# Patient Record
Sex: Female | Born: 1940 | Race: White | Hispanic: No | Smoking: Former smoker
Health system: Southern US, Community
[De-identification: ages and names within clinical notes are randomized; demographics above are authoritative.]

## PROBLEM LIST (undated history)

## (undated) DIAGNOSIS — I10 Essential (primary) hypertension: Secondary | ICD-10-CM

## (undated) DIAGNOSIS — G43909 Migraine, unspecified, not intractable, without status migrainosus: Secondary | ICD-10-CM

## (undated) DIAGNOSIS — E7800 Pure hypercholesterolemia, unspecified: Secondary | ICD-10-CM

## (undated) DIAGNOSIS — K219 Gastro-esophageal reflux disease without esophagitis: Secondary | ICD-10-CM

## (undated) DIAGNOSIS — R609 Edema, unspecified: Secondary | ICD-10-CM

## (undated) DIAGNOSIS — F32A Depression, unspecified: Secondary | ICD-10-CM

## (undated) DIAGNOSIS — I509 Heart failure, unspecified: Secondary | ICD-10-CM

## (undated) DIAGNOSIS — F329 Major depressive disorder, single episode, unspecified: Secondary | ICD-10-CM

## (undated) HISTORY — PX: CORONARY ANGIOPLASTY: SHX604

## (undated) HISTORY — PX: APPENDECTOMY: SHX54

## (undated) HISTORY — PX: LAMINECTOMY: SHX219

## (undated) HISTORY — PX: ABDOMINAL HYSTERECTOMY: SHX81

## (undated) HISTORY — DX: Edema, unspecified: R60.9

## (undated) HISTORY — PX: PARATHYROIDECTOMY: SHX19

---

## 2008-12-27 ENCOUNTER — Encounter: Admission: RE | Admit: 2008-12-27 | Discharge: 2008-12-27 | Payer: Self-pay | Admitting: Neurological Surgery

## 2009-12-29 ENCOUNTER — Ambulatory Visit (HOSPITAL_COMMUNITY): Admission: RE | Admit: 2009-12-29 | Discharge: 2009-12-29 | Payer: Self-pay | Admitting: Neurological Surgery

## 2010-04-20 ENCOUNTER — Emergency Department (HOSPITAL_BASED_OUTPATIENT_CLINIC_OR_DEPARTMENT_OTHER): Admission: EM | Admit: 2010-04-20 | Discharge: 2010-04-20 | Payer: Self-pay | Admitting: Emergency Medicine

## 2010-04-20 ENCOUNTER — Ambulatory Visit: Payer: Self-pay | Admitting: Radiology

## 2010-12-17 ENCOUNTER — Encounter: Payer: Self-pay | Admitting: Neurological Surgery

## 2011-02-12 LAB — BASIC METABOLIC PANEL
Calcium: 10.8 mg/dL — ABNORMAL HIGH (ref 8.4–10.5)
Chloride: 111 mEq/L (ref 96–112)
Creatinine, Ser: 1.1 mg/dL (ref 0.4–1.2)
GFR calc Af Amer: >60 mL/min (ref >60)

## 2011-02-15 LAB — DIFFERENTIAL
Basophils Absolute: 0 10*3/uL (ref 0.0–0.1)
Eosinophils Absolute: 0.1 10*3/uL (ref 0.0–0.7)
Eosinophils Relative: 1 % (ref 0–5)
Lymphocytes Relative: 30 % (ref 12–46)
Neutro Abs: 3.8 10*3/uL (ref 1.7–7.7)
Neutrophils Relative %: 60 % (ref 43–77)

## 2011-02-15 LAB — CBC
Hemoglobin: 12.9 g/dL (ref 12.0–15.0)
MCHC: 33.8 g/dL (ref 30.0–36.0)
MCV: 94.3 fL (ref 78.0–100.0)
Platelets: 199 10*3/uL (ref 150–400)
RBC: 4.05 MIL/uL (ref 3.87–5.11)
RDW: 14.2 % (ref 11.5–15.5)

## 2011-02-15 LAB — PROTIME-INR: INR: 0.88 (ref 0.00–1.49)

## 2011-02-15 LAB — BASIC METABOLIC PANEL
BUN: 18 mg/dL (ref 6–23)
Calcium: 10.9 mg/dL — ABNORMAL HIGH (ref 8.4–10.5)
GFR calc Af Amer: >60 mL/min (ref >60)
Potassium: 4.4 mEq/L (ref 3.5–5.1)
Sodium: 139 mEq/L (ref 135–145)

## 2012-10-23 ENCOUNTER — Encounter (HOSPITAL_BASED_OUTPATIENT_CLINIC_OR_DEPARTMENT_OTHER): Payer: Self-pay | Admitting: *Deleted

## 2012-10-23 ENCOUNTER — Emergency Department (HOSPITAL_BASED_OUTPATIENT_CLINIC_OR_DEPARTMENT_OTHER): Payer: Medicare Other

## 2012-10-23 ENCOUNTER — Emergency Department (HOSPITAL_BASED_OUTPATIENT_CLINIC_OR_DEPARTMENT_OTHER)
Admission: EM | Admit: 2012-10-23 | Discharge: 2012-10-23 | Disposition: A | Discharge status: Home / Self Care | Payer: Medicare Other | Attending: Emergency Medicine | Admitting: Emergency Medicine

## 2012-10-23 DIAGNOSIS — S61209A Unspecified open wound of unspecified finger without damage to nail, initial encounter: Secondary | ICD-10-CM | POA: Insufficient documentation

## 2012-10-23 DIAGNOSIS — Z87891 Personal history of nicotine dependence: Secondary | ICD-10-CM | POA: Insufficient documentation

## 2012-10-23 DIAGNOSIS — W01119A Fall on same level from slipping, tripping and stumbling with subsequent striking against unspecified sharp object, initial encounter: Secondary | ICD-10-CM | POA: Insufficient documentation

## 2012-10-23 DIAGNOSIS — S99929A Unspecified injury of unspecified foot, initial encounter: Secondary | ICD-10-CM | POA: Insufficient documentation

## 2012-10-23 DIAGNOSIS — Z23 Encounter for immunization: Secondary | ICD-10-CM | POA: Insufficient documentation

## 2012-10-23 DIAGNOSIS — Y929 Unspecified place or not applicable: Secondary | ICD-10-CM | POA: Insufficient documentation

## 2012-10-23 DIAGNOSIS — M25569 Pain in unspecified knee: Secondary | ICD-10-CM

## 2012-10-23 DIAGNOSIS — S8990XA Unspecified injury of unspecified lower leg, initial encounter: Secondary | ICD-10-CM | POA: Insufficient documentation

## 2012-10-23 DIAGNOSIS — S61219A Laceration without foreign body of unspecified finger without damage to nail, initial encounter: Secondary | ICD-10-CM

## 2012-10-23 DIAGNOSIS — Y939 Activity, unspecified: Secondary | ICD-10-CM | POA: Insufficient documentation

## 2012-10-23 DIAGNOSIS — Z79899 Other long term (current) drug therapy: Secondary | ICD-10-CM | POA: Insufficient documentation

## 2012-10-23 DIAGNOSIS — I10 Essential (primary) hypertension: Secondary | ICD-10-CM | POA: Insufficient documentation

## 2012-10-23 HISTORY — DX: Essential (primary) hypertension: I10

## 2012-10-23 MED ORDER — CEPHALEXIN 500 MG PO CAPS: 500.0000 mg | ORAL_CAPSULE | Freq: Four times a day (QID) | ORAL | Status: DC

## 2012-10-23 MED ORDER — LIDOCAINE HCL 2 % IJ SOLN
INTRAMUSCULAR | Status: AC
  Filled 2012-10-23: qty 20

## 2012-10-23 MED ORDER — HYDROCODONE-ACETAMINOPHEN 5-325 MG PO TABS
2.0000 | ORAL_TABLET | Freq: Once | ORAL | Status: AC
  Administered 2012-10-23: 2 via ORAL
  Filled 2012-10-23: qty 2

## 2012-10-23 MED ORDER — HYDROCODONE-ACETAMINOPHEN 5-325 MG PO TABS: 2.0000 | ORAL_TABLET | Freq: Four times a day (QID) | ORAL | Status: DC | PRN

## 2012-10-23 MED ORDER — TETANUS-DIPHTH-ACELL PERTUSSIS 5-2.5-18.5 LF-MCG/0.5 IM SUSP
0.5000 mL | Freq: Once | INTRAMUSCULAR | Status: AC
  Administered 2012-10-23: 0.5 mL via INTRAMUSCULAR
  Filled 2012-10-23: qty 0.5

## 2012-10-23 NOTE — ED Notes (Signed)
Patient transported to X-ray 

## 2012-10-23 NOTE — ED Notes (Addendum)
Pt reports fell over cord and ran into door. Pain to middle right finger. Pt states door shut on finger. Small laceration to inner finger, bleeding controlled, swelling/bruising to tip of finger as well. Pt also c/o left knee pain- ambulated to room, but small abrasion and swelling noted to knee.

## 2012-10-23 NOTE — ED Provider Notes (Signed)
History     CSN: [id number]  Arrival date & time 10/23/12  1652   First MD Initiated Contact with Patient 10/23/12 1719      Chief Complaint  Patient presents with  . Finger Injury    (Consider location/radiation/quality/duration/timing/severity/associated sxs/prior treatment) HPI Comments: This is a 71 year old female, who presents to the emergency department with chief complaint finger injury. She states that she tripped and fell on an electrical cord and cut her right middle finger on the door frame. She states that she is in moderate pain. She describes her pain as throbbing. She has not taken anything to alleviate her symptoms. Moving her finger makes pain worse.  The history is provided by the patient. No language interpreter was used.    Past Medical History  Diagnosis Date  . Hypertension     Past Surgical History  Procedure Date  . Cesarean section   . Appendectomy   . Parathyroidectomy   . Laminectomy     No family history on file.  History  Substance Use Topics  . Smoking status: Former Games developer  . Smokeless tobacco: Not on file  . Alcohol Use: Yes     Comment: occasional    OB History    Grav Para Term Preterm Abortions TAB SAB Ect Mult Living                  Review of Systems  All other systems reviewed and are negative.    Allergies  Morphine and related  Home Medications   Current Outpatient Rx  Name  Route  Sig  Dispense  Refill  . AMLODIPINE BESYLATE 5 MG PO TABS   Oral   Take 5 mg by mouth daily.         Marland Kitchen LISINOPRIL 5 MG PO TABS   Oral   Take 5 mg by mouth daily.         . CEPHALEXIN 500 MG PO CAPS   Oral   Take 1 capsule (500 mg total) by mouth 4 (four) times daily.   40 capsule   0   . HYDROCODONE-ACETAMINOPHEN 5-325 MG PO TABS   Oral   Take 2 tablets by mouth every 6 (six) hours as needed for pain.   10 tablet   0     BP 169/79  Pulse 89  Temp 97.9 F (36.6 C) (Oral)  Resp 20  Ht 5\' 4"  (1.626 m)  Wt 160  lb (72.576 kg)  BMI 27.46 kg/m2  SpO2 98%  Physical Exam  Nursing note and vitals reviewed. Constitutional: She is oriented to person, place, and time. She appears well-developed and well-nourished.  HENT:  Head: Normocephalic and atraumatic.  Eyes: Conjunctivae normal and EOM are normal. Pupils are equal, round, and reactive to light.  Neck: Normal range of motion. Neck supple.  Cardiovascular: Normal rate and regular rhythm.  Exam reveals no gallop and no friction rub.   No murmur heard. Pulmonary/Chest: Effort normal and breath sounds normal. No respiratory distress. She has no wheezes. She has no rales. She exhibits no tenderness.  Abdominal: Soft. Bowel sounds are normal. She exhibits no distension and no mass. There is no tenderness. There is no rebound and no guarding.  Musculoskeletal: Normal range of motion. She exhibits tenderness. She exhibits no edema.       Right middle finger tender to palpation over DIP. 2 cm laceration on the palmar aspect of the DIP  Neurological: She is alert and oriented to person, place,  and time.  Skin: Skin is warm and dry.  Psychiatric: She has a normal mood and affect. Her behavior is normal. Judgment and thought content normal.    ED Course  Procedures (including critical care time)  Labs Reviewed - No data to display Dg Knee 2 Views Left  10/23/2012  *RADIOLOGY REPORT*  Clinical Data: Fall  LEFT KNEE - 1-2 VIEW  Comparison: None.  Findings: Normal alignment and no fracture.  Joint spaces are normal.  No significant joint effusion.  IMPRESSION: No acute abnormality.   Original Report Authenticated By: Janeece Riggers, M.D.    Dg Finger Middle Right  10/23/2012  *RADIOLOGY REPORT*  Clinical Data: Fall  RIGHT MIDDLE FINGER 2+V  Comparison: None.  Findings: Negative for fracture.  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  IMPRESSION: Negative for fracture.   Original Report Authenticated By: Janeece Riggers,  M.D.   LACERATION REPAIR Performed by: Roxy Horseman Authorized by: Roxy Horseman Consent: Verbal consent obtained. Risks and benefits: risks, benefits and alternatives were discussed Consent given by: patient Patient identity confirmed: provided demographic data Prepped and Draped in normal sterile fashion Wound explored  Laceration Location: Right middle finger, palmar aspect, the DIP.  Laceration Length: 2 cm  No Foreign Bodies seen or palpated  Anesthesia: local infiltration  Local anesthetic: lidocaine 2 % without epinephrine  Anesthetic total: 5 ml  Irrigation method: syringe Amount of cleaning: standard  Skin closure: 4-0 monofilament   Number of sutures: 3   Technique: Simple interrupted   Patient tolerance: Patient tolerated the procedure well with no immediate complications.    1. Finger laceration   2. Knee pain       MDM  71 year old female with finger laceration. Repair the laceration. Patients pain was managed with Norco in the ED. Tetanus was updated. I will have the patient return in one week for suture removal. Return precautions have been given. Will give the patient Norco and Keflex. Patient is stable and ready for discharge. She understands and agrees with the plan.        Roxy Horseman, PA-C 10/23/12 1927

## 2012-10-24 NOTE — ED Provider Notes (Signed)
Medical screening examination/treatment/procedure(s) were performed by non-physician practitioner and as supervising physician I was immediately available for consultation/collaboration.   Celene Kras, MD 10/24/12 [phone]

## 2012-10-30 ENCOUNTER — Encounter (HOSPITAL_BASED_OUTPATIENT_CLINIC_OR_DEPARTMENT_OTHER): Payer: Self-pay | Admitting: *Deleted

## 2012-10-30 ENCOUNTER — Emergency Department (HOSPITAL_BASED_OUTPATIENT_CLINIC_OR_DEPARTMENT_OTHER)
Admission: EM | Admit: 2012-10-30 | Discharge: 2012-10-30 | Disposition: A | Discharge status: Home / Self Care | Payer: Medicare Other | Attending: Emergency Medicine | Admitting: Emergency Medicine

## 2012-10-30 DIAGNOSIS — Z79899 Other long term (current) drug therapy: Secondary | ICD-10-CM | POA: Insufficient documentation

## 2012-10-30 DIAGNOSIS — Z9889 Other specified postprocedural states: Secondary | ICD-10-CM | POA: Insufficient documentation

## 2012-10-30 DIAGNOSIS — Y929 Unspecified place or not applicable: Secondary | ICD-10-CM | POA: Insufficient documentation

## 2012-10-30 DIAGNOSIS — Z87891 Personal history of nicotine dependence: Secondary | ICD-10-CM | POA: Insufficient documentation

## 2012-10-30 DIAGNOSIS — IMO0002 Reserved for concepts with insufficient information to code with codable children: Secondary | ICD-10-CM | POA: Insufficient documentation

## 2012-10-30 DIAGNOSIS — S8392XA Sprain of unspecified site of left knee, initial encounter: Secondary | ICD-10-CM

## 2012-10-30 DIAGNOSIS — I10 Essential (primary) hypertension: Secondary | ICD-10-CM | POA: Insufficient documentation

## 2012-10-30 DIAGNOSIS — T148XXA Other injury of unspecified body region, initial encounter: Secondary | ICD-10-CM

## 2012-10-30 DIAGNOSIS — W1789XA Other fall from one level to another, initial encounter: Secondary | ICD-10-CM | POA: Insufficient documentation

## 2012-10-30 DIAGNOSIS — Z4802 Encounter for removal of sutures: Secondary | ICD-10-CM | POA: Insufficient documentation

## 2012-10-30 DIAGNOSIS — S61219A Laceration without foreign body of unspecified finger without damage to nail, initial encounter: Secondary | ICD-10-CM

## 2012-10-30 DIAGNOSIS — Y939 Activity, unspecified: Secondary | ICD-10-CM | POA: Insufficient documentation

## 2012-10-30 NOTE — ED Notes (Signed)
Suture removal right middle finger.

## 2012-10-30 NOTE — ED Notes (Signed)
Pt. Was here for suture removal and reports to EDP that she has ankle pain and L knee pain.  EDP to evaluate the Pt. Complaints.

## 2012-10-30 NOTE — ED Notes (Signed)
Pt. Has no drainage from her wound and does have mild edema noted in the R middle finger.

## 2012-10-30 NOTE — ED Provider Notes (Signed)
History     CSN: [id number]  Arrival date & time 10/30/12  1910   First MD Initiated Contact with Patient 10/30/12 1916      Chief Complaint  Patient presents with  . Generalized Body Aches    due to fall on Thanksgiving Day.  Pt. was seen here on Thanksgiving Day at 1700.     Patient is a 71 y.o. female presenting with suture removal. The history is provided by the patient.  Suture / Staple Removal  The sutures were placed 7 to 10 days ago. There has been no treatment since the wound repair. There has been no drainage from the wound. There is no redness present. There is no swelling present. The pain has new pain. She has no difficulty moving the affected extremity or digit.  pt had sutures placed to laceration of right middle finger.  She is here for removal  She also reports swelling/bruising to left LE.  She reports long h/o bilateral LE edema but seems worse and now has bruising to left LE.  She did injury her left knee in the fall that also caused the laceration    Past Medical History  Diagnosis Date  . Hypertension     Past Surgical History  Procedure Date  . Cesarean section   . Appendectomy   . Parathyroidectomy   . Laminectomy     No family history on file.  History  Substance Use Topics  . Smoking status: Former Games developer  . Smokeless tobacco: Not on file  . Alcohol Use: Yes     Comment: occasional    OB History    Grav Para Term Preterm Abortions TAB SAB Ect Mult Living                  Review of Systems  Constitutional: Negative for fever.  Skin: Positive for wound.    Allergies  Morphine and related  Home Medications   Current Outpatient Rx  Name  Route  Sig  Dispense  Refill  . AMLODIPINE BESYLATE 5 MG PO TABS   Oral   Take 5 mg by mouth daily.         . CEPHALEXIN 500 MG PO CAPS   Oral   Take 1 capsule (500 mg total) by mouth 4 (four) times daily.   40 capsule   0   . HYDROCODONE-ACETAMINOPHEN 5-325 MG PO TABS   Oral   Take  2 tablets by mouth every 6 (six) hours as needed for pain.   10 tablet   0   . LISINOPRIL 5 MG PO TABS   Oral   Take 5 mg by mouth daily.           BP 119/90  Pulse 88  Temp 98 F (36.7 C) (Oral)  Resp 20  SpO2 98%  Physical Exam CONSTITUTIONAL: Well developed/well nourished HEAD AND FACE: Normocephalic/atraumatic EYES: EOMI/PERRL ENMT: Mucous membranes moist NECK: supple no meningeal signs CV: S1/S2 noted, no murmurs/rubs/gallops noted LUNGS: Lungs are clear to auscultation bilaterally, no apparent distress ABDOMEN: soft, nontender, no rebound or guarding GU:no cva tenderness NEURO: Pt is awake/alert, moves all extremitiesx4 EXTREMITIES: pulses normal, full ROM. Well healed laceration to right middle finger.  No drainage.  Minimal erythema.  She can range the finger.  She has signs of arthritis in her fingers Equal/symmetric pitting edema to bilateral LE.  She has bruising that originates in left knee and has some bruising noted throughout her left LE.  There is  mild tenderness to left patella.   There is no calf tenderness and it is soft and there is no tenderness to left foot/ankle SKIN: warm, color normal PSYCH: no abnormalities of mood noted    ED Course  SUTURE REMOVAL Performed by: Joya Gaskins Authorized by: Joya Gaskins Consent: Verbal consent obtained. Body area: upper extremity Location details: right long finger Wound Appearance: clean Sutures Removed: 3 Post-removal: dressing applied and antibiotic ointment applied Facility: sutures placed in this facility Patient tolerance: Patient tolerated the procedure well with no immediate complications.     1. Finger laceration   2. Hematoma   3. Left knee sprain       MDM  Nursing notes including past medical history and social history reviewed and considered in documentation Previous records reviewed and considered - previous xrays reviewed  For wound, bacitracin and keep bandage in  place the next several days For her left LE, she has chronic edema that is unchanged.  She does have bruising to left LE but feel this is related to recent fall.  No calf tenderness.  Doubt PE.           Joya Gaskins, MD 10/30/12 2046

## 2013-04-13 ENCOUNTER — Other Ambulatory Visit: Payer: Self-pay | Admitting: *Deleted

## 2013-04-13 ENCOUNTER — Non-Acute Institutional Stay (SKILLED_NURSING_FACILITY): Payer: Medicare Other | Admitting: Adult Health

## 2013-04-13 DIAGNOSIS — M25579 Pain in unspecified ankle and joints of unspecified foot: Secondary | ICD-10-CM

## 2013-04-13 DIAGNOSIS — I10 Essential (primary) hypertension: Secondary | ICD-10-CM

## 2013-04-13 DIAGNOSIS — F411 Generalized anxiety disorder: Secondary | ICD-10-CM

## 2013-04-13 DIAGNOSIS — IMO0001 Reserved for inherently not codable concepts without codable children: Secondary | ICD-10-CM

## 2013-04-13 DIAGNOSIS — S82842D Displaced bimalleolar fracture of left lower leg, subsequent encounter for closed fracture with routine healing: Secondary | ICD-10-CM

## 2013-04-13 DIAGNOSIS — K5900 Constipation, unspecified: Secondary | ICD-10-CM

## 2013-04-13 DIAGNOSIS — G4700 Insomnia, unspecified: Secondary | ICD-10-CM

## 2013-04-13 DIAGNOSIS — M25572 Pain in left ankle and joints of left foot: Secondary | ICD-10-CM

## 2013-04-13 DIAGNOSIS — K219 Gastro-esophageal reflux disease without esophagitis: Secondary | ICD-10-CM

## 2013-04-13 DIAGNOSIS — F419 Anxiety disorder, unspecified: Secondary | ICD-10-CM

## 2013-04-13 MED ORDER — OXYCODONE-ACETAMINOPHEN 5-325 MG PO TABS: ORAL_TABLET | ORAL | Status: DC

## 2013-04-14 ENCOUNTER — Non-Acute Institutional Stay (SKILLED_NURSING_FACILITY): Payer: Medicare Other | Admitting: Internal Medicine

## 2013-04-14 DIAGNOSIS — S82842S Displaced bimalleolar fracture of left lower leg, sequela: Secondary | ICD-10-CM

## 2013-04-14 DIAGNOSIS — I10 Essential (primary) hypertension: Secondary | ICD-10-CM

## 2013-04-14 DIAGNOSIS — S8290XS Unspecified fracture of unspecified lower leg, sequela: Secondary | ICD-10-CM

## 2013-04-14 DIAGNOSIS — G4700 Insomnia, unspecified: Secondary | ICD-10-CM

## 2013-04-14 DIAGNOSIS — M79609 Pain in unspecified limb: Secondary | ICD-10-CM

## 2013-04-15 ENCOUNTER — Non-Acute Institutional Stay (SKILLED_NURSING_FACILITY): Payer: Medicare Other | Admitting: Adult Health

## 2013-04-15 DIAGNOSIS — B372 Candidiasis of skin and nail: Secondary | ICD-10-CM

## 2013-04-21 ENCOUNTER — Other Ambulatory Visit: Payer: Self-pay | Admitting: Geriatric Medicine

## 2013-04-21 MED ORDER — OXYCODONE-ACETAMINOPHEN 5-325 MG PO TABS: ORAL_TABLET | ORAL | Status: DC

## 2013-05-02 DIAGNOSIS — M810 Age-related osteoporosis without current pathological fracture: Secondary | ICD-10-CM | POA: Insufficient documentation

## 2013-05-02 DIAGNOSIS — F329 Major depressive disorder, single episode, unspecified: Secondary | ICD-10-CM | POA: Insufficient documentation

## 2013-05-02 DIAGNOSIS — E213 Hyperparathyroidism, unspecified: Secondary | ICD-10-CM | POA: Insufficient documentation

## 2013-05-02 DIAGNOSIS — H9190 Unspecified hearing loss, unspecified ear: Secondary | ICD-10-CM | POA: Insufficient documentation

## 2013-05-02 DIAGNOSIS — R5383 Other fatigue: Secondary | ICD-10-CM | POA: Insufficient documentation

## 2013-05-02 DIAGNOSIS — F331 Major depressive disorder, recurrent, moderate: Secondary | ICD-10-CM | POA: Insufficient documentation

## 2013-05-02 DIAGNOSIS — E785 Hyperlipidemia, unspecified: Secondary | ICD-10-CM | POA: Insufficient documentation

## 2013-05-07 DIAGNOSIS — G4700 Insomnia, unspecified: Secondary | ICD-10-CM | POA: Insufficient documentation

## 2013-05-07 DIAGNOSIS — M79609 Pain in unspecified limb: Secondary | ICD-10-CM | POA: Insufficient documentation

## 2013-05-07 DIAGNOSIS — I10 Essential (primary) hypertension: Secondary | ICD-10-CM | POA: Insufficient documentation

## 2013-05-07 DIAGNOSIS — S82843A Displaced bimalleolar fracture of unspecified lower leg, initial encounter for closed fracture: Secondary | ICD-10-CM | POA: Insufficient documentation

## 2013-05-07 NOTE — Progress Notes (Signed)
Patient ID: Stephanie Mckenzie, female   DOB: 1941/02/26, 72 y.o.   MRN: [id number]        HISTORY & PHYSICAL  DATE: 04/14/2013   FACILITY: [facility]  LEVEL OF CARE: SNF (31)  ALLERGIES:  Allergies  Allergen Reactions  . Morphine And Related Itching    CHIEF COMPLAINT:  Manage left ankle fracture, hypertension, and insomnia.    HISTORY OF PRESENT ILLNESS:  The patient is a 72 year-old, Caucasian female.    ANKLE FRACTURE: The patient had a mechanical fall and sustained an ankle fracture. Patient underwent surgical repair and tolerated the procedure well. Patient is admitted to this facility for short-term rehabilitation. The patient complains of uncontrolled left lower extremity pain as well as pain in multiple areas. No complications reported from the current medication(s) being used.   HTN: Pt 's HTN remains stable.  Denies CP, sob, DOE, pedal edema, headaches, dizziness or visual disturbances.  No complications from the medications currently being used.  Last BP : 163/85, 142/85.  INSOMNIA: The insomnia remains stable.  No complications noted from the medications presently being used. Patient denies ongoing insomnia, pain, hallucinations, delusions.   PAST MEDICAL HISTORY :  Past Medical History  Diagnosis Date  . Hypertension    Depression.   Insomnia.    Anxiety.    GERD.  Constipation.   PAST SURGICAL HISTORY: Past Surgical History  Procedure Laterality Date  . Cesarean section    . Appendectomy    . Parathyroidectomy    . Laminectomy      SOCIAL HISTORY:  reports that she has quit smoking. She does not have any smokeless tobacco history on file. She reports that  drinks alcohol. She reports that she does not use illicit drugs. HOUSING:  The patient lives alone.  FAMILY HISTORY: None  CURRENT MEDICATIONS: Reviewed per MAR  REVIEW OF SYSTEMS:  See HPI otherwise 14 point ROS is negative.  PHYSICAL EXAMINATION  VS:  T 96.6      P 85        RR 22      BP 163/85      POX 97%        WT (Lb)  GENERAL: no acute distress, normal body habitus SKIN: warm & dry, no suspicious lesions or rashes, no excessive dryness, left lower extremity is in a cast EYES: conjunctivae normal, sclerae normal, normal eye lids MOUTH/THROAT: lips without lesions,no lesions in the mouth,tongue is without lesions,uvula elevates in midline NECK: supple, trachea midline, no neck masses, no thyroid tenderness, no thyromegaly LYMPHATICS: no LAN in the neck, no supraclavicular LAN RESPIRATORY: breathing is even & unlabored, BS CTAB CARDIAC: RRR, no murmur,no extra heart sounds, no edema GI:  ABDOMEN: abdomen soft, normal BS, no masses, no tenderness  LIVER/SPLEEN: no hepatomegaly, no splenomegaly MUSCULOSKELETAL: HEAD: normal to inspection & palpation BACK: no kyphosis, scoliosis or spinal processes tenderness EXTREMITIES: LEFT UPPER EXTREMITY: full range of motion, normal strength & tone RIGHT UPPER EXTREMITY:  full range of motion, normal strength & tone LEFT LOWER EXTREMITY: not tested due to cast RIGHT LOWER EXTREMITY:  full range of motion, normal strength & tone PSYCHIATRIC: the patient is alert & oriented to person, affect & behavior appropriate  LABS/RADIOLOGY: Hemoglobin 10, MCV 89, otherwise CBC normal.    Glucose 156, otherwise BMP normal.    CK:  86, 89, 179.  CK-MB:  2.2, 2.1, 4.6.  Troponin-I:  0.01, less than 0.01 x3.    Urinalysis negative.  ASSESSMENT/PLAN:  Left trimalleolar fracture.  Status post ORIF.  Continue rehabilitation.   Hypertension.  Blood pressure elevated.  Likely secondary to pain.  We will monitor for now.   Insomnia.  Continue trazodone.   Left lower extremity pain.  Uncontrolled problem.  Pain medications adjusted.    GERD.  Well controlled.    Check CBC and BMP.   I have reviewed patient's medical records received at admission/from hospitalization.  CPT CODE: 16109

## 2013-05-08 ENCOUNTER — Other Ambulatory Visit: Payer: Self-pay | Admitting: *Deleted

## 2013-05-08 MED ORDER — ALPRAZOLAM 0.5 MG PO TABS: ORAL_TABLET | ORAL | Status: DC

## 2013-05-15 ENCOUNTER — Non-Acute Institutional Stay (SKILLED_NURSING_FACILITY): Payer: Medicare Other | Admitting: Internal Medicine

## 2013-05-15 DIAGNOSIS — S8290XS Unspecified fracture of unspecified lower leg, sequela: Secondary | ICD-10-CM

## 2013-05-15 DIAGNOSIS — F329 Major depressive disorder, single episode, unspecified: Secondary | ICD-10-CM

## 2013-05-15 DIAGNOSIS — G4700 Insomnia, unspecified: Secondary | ICD-10-CM

## 2013-05-15 DIAGNOSIS — I10 Essential (primary) hypertension: Secondary | ICD-10-CM

## 2013-05-15 DIAGNOSIS — S82842S Displaced bimalleolar fracture of left lower leg, sequela: Secondary | ICD-10-CM

## 2013-05-18 ENCOUNTER — Other Ambulatory Visit: Payer: Self-pay | Admitting: *Deleted

## 2013-05-18 MED ORDER — OXYCODONE HCL 5 MG PO TABS: ORAL_TABLET | ORAL | Status: DC

## 2013-05-22 DIAGNOSIS — F418 Other specified anxiety disorders: Secondary | ICD-10-CM | POA: Insufficient documentation

## 2013-05-22 NOTE — Progress Notes (Signed)
PROGRESS NOTE  DATE: 05/15/2013  FACILITY: Nursing Home Location: [facility]  LEVEL OF CARE: SNF (31)  Routine Visit  CHIEF COMPLAINT:  Manage hypertension, insomnia and depression  HISTORY OF PRESENT ILLNESS:  REASSESSMENT OF ONGOING PROBLEM(S):  HTN: Pt 's HTN remains stable.  Denies CP, sob, DOE, pedal edema, headaches, dizziness or visual disturbances.  No complications from the medications currently being used.  Last BP : 124/ 61.  DEPRESSION: The depression remains stable. Patient denies ongoing feelings of sadness, insomnia, anedhonia or lack of appetite. No complications reported from the medications currently being used. Staff do not report behavioral problems.  INSOMNIA: The insomnia remains stable.  No complications noted from the medications presently being used. Patient denies ongoing insomnia, pain, hallucinations, delusions.  PAST MEDICAL HISTORY : Reviewed.  No changes.  CURRENT MEDICATIONS: Reviewed per [facility]  REVIEW OF SYSTEMS:  GENERAL: no change in appetite, no fatigue, no weight changes, no fever, chills or weakness RESPIRATORY: no cough, SOB, DOE, wheezing, hemoptysis CARDIAC: no chest pain, edema or palpitations GI: no abdominal pain, diarrhea, constipation, heart burn, nausea or vomiting  PHYSICAL EXAMINATION  VS:  T 98.3       P 73      RR 18      BP 124/61     POX %     WT (Lb)  GENERAL: no acute distress, normal body habitus EYES: conjunctivae normal, sclerae normal, normal eye lids NECK: supple, trachea midline, no neck masses, no thyroid tenderness, no thyromegaly LYMPHATICS: no LAN in the neck, no supraclavicular LAN RESPIRATORY: breathing is even & unlabored, BS CTAB CARDIAC: RRR, no murmur,no extra heart sounds, no edema GI: abdomen soft, normal BS, no masses, no tenderness, no hepatomegaly, no splenomegaly PSYCHIATRIC: the patient is alert & oriented to person, affect & behavior appropriate  LABS/RADIOLOGY:  5/14  hemoglobin 11.4, MCV 85 otherwise CBC normal, BMP normal  ASSESSMENT/PLAN:  HTN-well controlled. Insomina-denies ongoing sx. Depression-stable. Anxiety-stable. GERD-well controlled. Constipation-well controlled. Left bimalleolar fx-s/p ORIF.  Cont. Rehab. Check liver profile.  CPT CODE: 16109

## 2013-06-09 ENCOUNTER — Encounter: Payer: Self-pay | Admitting: Adult Health

## 2013-06-09 ENCOUNTER — Non-Acute Institutional Stay (SKILLED_NURSING_FACILITY): Payer: Medicare Other | Admitting: Adult Health

## 2013-06-09 DIAGNOSIS — F419 Anxiety disorder, unspecified: Secondary | ICD-10-CM

## 2013-06-09 DIAGNOSIS — K5900 Constipation, unspecified: Secondary | ICD-10-CM | POA: Insufficient documentation

## 2013-06-09 DIAGNOSIS — I10 Essential (primary) hypertension: Secondary | ICD-10-CM

## 2013-06-09 DIAGNOSIS — K219 Gastro-esophageal reflux disease without esophagitis: Secondary | ICD-10-CM | POA: Insufficient documentation

## 2013-06-09 DIAGNOSIS — G4700 Insomnia, unspecified: Secondary | ICD-10-CM | POA: Insufficient documentation

## 2013-06-09 DIAGNOSIS — IMO0001 Reserved for inherently not codable concepts without codable children: Secondary | ICD-10-CM

## 2013-06-09 DIAGNOSIS — M25572 Pain in left ankle and joints of left foot: Secondary | ICD-10-CM

## 2013-06-09 DIAGNOSIS — M25579 Pain in unspecified ankle and joints of unspecified foot: Secondary | ICD-10-CM

## 2013-06-09 DIAGNOSIS — F329 Major depressive disorder, single episode, unspecified: Secondary | ICD-10-CM

## 2013-06-09 DIAGNOSIS — F411 Generalized anxiety disorder: Secondary | ICD-10-CM

## 2013-06-09 DIAGNOSIS — S82842D Displaced bimalleolar fracture of left lower leg, subsequent encounter for closed fracture with routine healing: Secondary | ICD-10-CM

## 2013-06-09 NOTE — Progress Notes (Signed)
  Subjective:    Patient ID: Stephanie Mckenzie, female    DOB: 03-03-1941, 72 y.o.   MRN: [id number]  HPI  This is a 72 year old female who is for discharge home with home health PT, OT and nursing.  DME: Rolling walker due to unsteady gait. [facility] status post ORIF of left trimalleolar ankle fracture. Patient has completed SNF rehabilitation and therapy has cleared the patient for discharge.    Review of Systems  Constitutional: Negative.   HENT: Negative.   Eyes: Negative.   Respiratory: Negative for shortness of breath.   Cardiovascular: Negative for leg swelling.  Gastrointestinal: Negative for abdominal distention.  Endocrine: Negative.   Genitourinary: Negative.   Neurological: Negative.   Hematological: Negative for adenopathy. Does not bruise/bleed easily.       Objective:   Physical Exam  Nursing note and vitals reviewed. Constitutional: She is oriented to person, place, and time. She appears well-developed and well-nourished.  HENT:  Head: Normocephalic and atraumatic.  Right Ear: External ear normal.  Left Ear: External ear normal.  Eyes: Conjunctivae and EOM are normal. Pupils are equal, round, and reactive to light.  Neck: Normal range of motion. Neck supple. No thyromegaly present.  Cardiovascular: Normal rate, regular rhythm and normal heart sounds.   Pulmonary/Chest: Effort normal and breath sounds normal.  Abdominal: Soft. Bowel sounds are normal.  Musculoskeletal: Normal range of motion. She exhibits no edema and no tenderness.  Neurological: She is alert and oriented to person, place, and time.  Skin: Skin is warm and dry.  Psychiatric: She has a normal mood and affect. Her behavior is normal. Judgment and thought content normal.      LABS/PROCEDURE: 04/13/13 WBC 6.4 hemoglobin 11.4 hematocrit 34.0 sodium 139 potassium 4.6 glucose 83 BUN 17 creatinine 0.88 cultures  9.4  Left ankle x-ray shows healing fractures involving the lateral malleolus and medial malleolus with orthopedic hardware in place. 04/10/13 sodium 140 potassium 3.8 BUN 18 creatinine 0.80 glucose 84 calcium 8.8  04/09/13 WBC 5.9 hemoglobin 10.0 hematocrit 31.1    Medications reviewed.    Assessment & Plan:   Anxiety - stable  Pain in joint, ankle and foot - well controlled  GERD (gastroesophageal reflux disease) - stable  Insomnia - stable  Constipation - stable  Depressive disorder, not elsewhere classified - stable  Bimalleolar fracture S/P ORIF -  For  home health PT, Nursing and OT  Essential hypertension, benign - well controlled  Insomnia, unspecified -  stable      Total discharge time: > 30 minutes Discharge time involved coordination of the discharge process with social worker or, nursing staff and therapy department. Medical justification for home health services/DME verified.     CPT CODE: 13086

## 2013-06-09 NOTE — Progress Notes (Signed)
  Subjective:    Patient ID: Stephanie Mckenzie, female    DOB: 30-Sep-1941, 72 y.o.   MRN: [id number]  HPI This is a 72 year old female who was being [facility]10[facility] with discharge diagnosis of status post ORIF of left trimalleolar ankle fracture. She has been admitted for a short-term rehabilitation. I was called in to assess patient. Patient was reaching for able when accidentally few of the books fell on her left ankle with a cast. Cast is intact.  Review of Systems  Constitutional: Negative.   HENT: Negative.   Eyes: Negative.   Respiratory: Negative for shortness of breath.   Cardiovascular: Negative for leg swelling.  Gastrointestinal: Negative for abdominal distention.  Endocrine: Negative.   Genitourinary: Negative.   Neurological: Negative.   Hematological: Negative for adenopathy. Does not bruise/bleed easily.  Psychiatric/Behavioral: The patient is nervous/anxious.        Objective:   Physical Exam  Nursing note and vitals reviewed. Constitutional: She is oriented to person, place, and time. She appears well-developed and well-nourished.  HENT:  Head: Normocephalic and atraumatic.  Right Ear: External ear normal.  Left Ear: External ear normal.  Eyes: Conjunctivae and EOM are normal. Pupils are equal, round, and reactive to light.  Neck: Normal range of motion. Neck supple. No thyromegaly present.  Cardiovascular: Normal rate, regular rhythm and normal heart sounds.   Pulmonary/Chest: Effort normal and breath sounds normal.  Abdominal: Soft. Bowel sounds are normal.  Musculoskeletal: She exhibits tenderness. She exhibits no edema.  Neurological: She is alert and oriented to person, place, and time.  Skin: Skin is warm and dry.  Psychiatric: Her behavior is normal. Judgment and thought content normal.  anxious      LABS/PROCEDURE: 04/10/13 sodium 140 potassium 3.8 BUN 18 creatinine 0.80 glucose 84 calcium 8.8  04/09/13 WBC  5.9 hemoglobin 10.0 hematocrit 31.1    Medications reviewed.    Assessment & Plan:   Anxiety - and Xanax 0.5 mg 1 tab by mouth now.  Pain in joint, ankle and foot - increased Ercocet 5/325 mg 1 tab by mouth q. 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

## 2013-06-09 NOTE — Progress Notes (Signed)
  Subjective:    Patient ID: Stephanie Mckenzie, female    DOB: [birth year], 72 y.o.   MRN: [id number]  HPI  This is a 72 year old female who is being seen due to rashes underneath bilateral breasts. Noted  erythematosus small rashes underneath bilateral breasts.   Review of Systems  Constitutional: Negative.   HENT: Negative.   Eyes: Negative.   Respiratory: Negative for shortness of breath.   Cardiovascular: Negative for leg swelling.  Gastrointestinal: Negative for abdominal distention.  Endocrine: Negative.   Genitourinary: Negative.   Skin: Positive for rash.  Neurological: Negative.   Hematological: Negative for adenopathy. Does not bruise/bleed easily.  Psychiatric/Behavioral: The patient is not nervous/anxious.        Objective:   Physical Exam  Nursing note and vitals reviewed. Constitutional: She is oriented to person, place, and time. She appears well-developed and well-nourished.  HENT:  Head: Normocephalic and atraumatic.  Right Ear: External ear normal.  Left Ear: External ear normal.  Eyes: Conjunctivae and EOM are normal. Pupils are equal, round, and reactive to light.  Neck: Normal range of motion. Neck supple. No thyromegaly present.  Cardiovascular: Normal rate, regular rhythm and normal heart sounds.   Pulmonary/Chest: Effort normal and breath sounds normal.  Abdominal: Soft. Bowel sounds are normal.  Musculoskeletal: She exhibits tenderness. She exhibits no edema.  Neurological: She is alert and oriented to person, place, and time.  Skin: Skin is warm and dry. Rash noted.  Erythematous rashes under bilateral breasts  Psychiatric: She has a normal mood and affect. Her behavior is normal. Judgment and thought content normal.      LABS/PROCEDURE: 04/13/13 WBC 6.4 hemoglobin 11.4 hematocrit 34.0 sodium 139 potassium 4.6 glucose 83 BUN 17 creatinine 0.88 cultures 9.4  Left ankle x-ray shows healing fractures involving the lateral malleolus and medial malleolus  with orthopedic hardware in place. 04/10/13 sodium 140 potassium 3.8 BUN 18 creatinine 0.80 glucose 84 calcium 8.8  04/09/13 WBC 5.9 hemoglobin 10.0 hematocrit 31.1    Medications reviewed.     Assessment & Plan:   Candidal skin infection - apply nystatin 100,000 units per gram powder to rashes under bilateral breasts twice a day x2 weeks

## 2014-08-02 DIAGNOSIS — D4959 Neoplasm of unspecified behavior of other genitourinary organ: Secondary | ICD-10-CM | POA: Insufficient documentation

## 2014-08-05 DIAGNOSIS — K5190 Ulcerative colitis, unspecified, without complications: Secondary | ICD-10-CM | POA: Insufficient documentation

## 2014-08-11 DIAGNOSIS — K529 Noninfective gastroenteritis and colitis, unspecified: Secondary | ICD-10-CM | POA: Insufficient documentation

## 2014-08-26 DIAGNOSIS — N83209 Unspecified ovarian cyst, unspecified side: Secondary | ICD-10-CM | POA: Insufficient documentation

## 2014-09-24 DIAGNOSIS — R1900 Intra-abdominal and pelvic swelling, mass and lump, unspecified site: Secondary | ICD-10-CM | POA: Insufficient documentation

## 2014-09-24 DIAGNOSIS — I2510 Atherosclerotic heart disease of native coronary artery without angina pectoris: Secondary | ICD-10-CM | POA: Insufficient documentation

## 2014-09-30 DIAGNOSIS — Z8719 Personal history of other diseases of the digestive system: Secondary | ICD-10-CM | POA: Insufficient documentation

## 2014-09-30 DIAGNOSIS — I10 Essential (primary) hypertension: Secondary | ICD-10-CM | POA: Insufficient documentation

## 2015-01-18 DIAGNOSIS — M7060 Trochanteric bursitis, unspecified hip: Secondary | ICD-10-CM | POA: Insufficient documentation

## 2015-01-19 DIAGNOSIS — N189 Chronic kidney disease, unspecified: Secondary | ICD-10-CM | POA: Insufficient documentation

## 2015-05-04 ENCOUNTER — Emergency Department (HOSPITAL_BASED_OUTPATIENT_CLINIC_OR_DEPARTMENT_OTHER): Payer: PPO

## 2015-05-04 ENCOUNTER — Emergency Department (HOSPITAL_BASED_OUTPATIENT_CLINIC_OR_DEPARTMENT_OTHER)
Admission: EM | Admit: 2015-05-04 | Discharge: 2015-05-04 | Disposition: A | Discharge status: Home / Self Care | Payer: PPO | Attending: Emergency Medicine | Admitting: Emergency Medicine

## 2015-05-04 ENCOUNTER — Encounter (HOSPITAL_BASED_OUTPATIENT_CLINIC_OR_DEPARTMENT_OTHER): Payer: Self-pay | Admitting: *Deleted

## 2015-05-04 DIAGNOSIS — I10 Essential (primary) hypertension: Secondary | ICD-10-CM | POA: Diagnosis not present

## 2015-05-04 DIAGNOSIS — Z7982 Long term (current) use of aspirin: Secondary | ICD-10-CM | POA: Insufficient documentation

## 2015-05-04 DIAGNOSIS — Z87891 Personal history of nicotine dependence: Secondary | ICD-10-CM | POA: Diagnosis not present

## 2015-05-04 DIAGNOSIS — G43909 Migraine, unspecified, not intractable, without status migrainosus: Secondary | ICD-10-CM | POA: Diagnosis not present

## 2015-05-04 DIAGNOSIS — R51 Headache: Secondary | ICD-10-CM | POA: Diagnosis present

## 2015-05-04 DIAGNOSIS — R519 Headache, unspecified: Secondary | ICD-10-CM

## 2015-05-04 DIAGNOSIS — Z79899 Other long term (current) drug therapy: Secondary | ICD-10-CM | POA: Diagnosis not present

## 2015-05-04 HISTORY — DX: Migraine, unspecified, not intractable, without status migrainosus: G43.909

## 2015-05-04 LAB — CBC WITH DIFFERENTIAL/PLATELET
Basophils Absolute: 0 10*3/uL (ref 0.0–0.1)
Basophils Relative: 0 % (ref 0–1)
EOS ABS: 0.1 10*3/uL (ref 0.0–0.7)
EOS PCT: 1 % (ref 0–5)
HEMATOCRIT: 37.7 % (ref 36.0–46.0)
Hemoglobin: 12 g/dL (ref 12.0–15.0)
LYMPHS ABS: 1.8 10*3/uL (ref 0.7–4.0)
Lymphocytes Relative: 27 % (ref 12–46)
MCH: 30.2 pg (ref 26.0–34.0)
MCHC: 31.8 g/dL (ref 30.0–36.0)
MCV: 95 fL (ref 78.0–100.0)
MONO ABS: 0.6 10*3/uL (ref 0.1–1.0)
Monocytes Relative: 9 % (ref 3–12)
Neutro Abs: 4.2 10*3/uL (ref 1.7–7.7)
Neutrophils Relative %: 63 % (ref 43–77)
Platelets: 203 10*3/uL (ref 150–400)
RBC: 3.97 MIL/uL (ref 3.87–5.11)
RDW: 15.1 % (ref 11.5–15.5)
WBC: 6.7 10*3/uL (ref 4.0–10.5)

## 2015-05-04 LAB — BASIC METABOLIC PANEL
Anion gap: 5 (ref 5–15)
BUN: 24 mg/dL — ABNORMAL HIGH (ref 6–20)
CO2: 23 mmol/L (ref 22–32)
Calcium: 9.2 mg/dL (ref 8.9–10.3)
Chloride: 110 mmol/L (ref 101–111)
Creatinine, Ser: 1.03 mg/dL — ABNORMAL HIGH (ref 0.44–1.00)
GFR, EST NON AFRICAN AMERICAN: 53 mL/min — AB (ref >60)
Glucose, Bld: 114 mg/dL — ABNORMAL HIGH (ref 65–99)
Potassium: 3.6 mmol/L (ref 3.5–5.1)
Sodium: 138 mmol/L (ref 135–145)

## 2015-05-04 MED ORDER — KETOROLAC TROMETHAMINE 15 MG/ML IJ SOLN
15.0000 mg | Freq: Once | INTRAMUSCULAR | Status: AC
  Administered 2015-05-04: 15 mg via INTRAVENOUS
  Filled 2015-05-04: qty 1

## 2015-05-04 MED ORDER — SODIUM CHLORIDE 0.9 % IV BOLUS (SEPSIS)
500.0000 mL | Freq: Once | INTRAVENOUS | Status: AC
  Administered 2015-05-04: 500 mL via INTRAVENOUS

## 2015-05-04 MED ORDER — DIPHENHYDRAMINE HCL 50 MG/ML IJ SOLN
25.0000 mg | Freq: Once | INTRAMUSCULAR | Status: AC
  Administered 2015-05-04: 25 mg via INTRAVENOUS
  Filled 2015-05-04: qty 1

## 2015-05-04 MED ORDER — HYDROMORPHONE HCL 1 MG/ML IJ SOLN
1.0000 mg | Freq: Once | INTRAMUSCULAR | Status: AC
  Administered 2015-05-04: 1 mg via INTRAVENOUS
  Filled 2015-05-04 (×2): qty 1

## 2015-05-04 MED ORDER — METOCLOPRAMIDE HCL 5 MG/ML IJ SOLN
10.0000 mg | Freq: Once | INTRAMUSCULAR | Status: AC
  Administered 2015-05-04: 10 mg via INTRAVENOUS
  Filled 2015-05-04: qty 2

## 2015-05-04 NOTE — ED Provider Notes (Signed)
Pt seen and evaluated.  History reviewed. Examined.  D/W PA.  Pt reports HA over several months, usually relieved with Fioricet and codeine. Today's headache was not. She does not have neurological symptoms. No numbness weakness tingling. No dizziness lightheadedness. No difficulty with vision balance or coordination.  Skin shows possible area of ischemia adjacent the right Centrum Semiovale. Sign of hemorrhage or mass effect. I discussed this with her at length. I recommended she follow up with her primary care physician. Do not think her symptoms today are related to acute ischemia. Her main complaint is headache and no neurological symptoms. Maximum blood pressure systolic here was 916. Recommend she follow-up with her primary care physician regarding blood pressure control. Continue her outpatient treatment regimen for her migraine headaches.  Tanna Furry, MD 05/04/15 Bosie Helper

## 2015-05-04 NOTE — ED Notes (Signed)
Pt d/c home with family to drive

## 2015-05-04 NOTE — ED Notes (Signed)
Patient transported to CT 

## 2015-05-04 NOTE — ED Provider Notes (Signed)
CSN: [id number]     Arrival date & time 05/04/15  1633 History   First MD Initiated Contact with Patient 05/04/15 1638     Chief Complaint  Patient presents with  . Headache     (Consider location/radiation/quality/duration/timing/severity/associated sxs/prior Treatment) HPI Comments: Patient is a 74 yo F presenting to the ED for a severe frontal headache, described as a throbbing and pounding in nature that has been worsening since she awoke this morning. Patient states she had a similar headache yesterday, states it was not as severe, alleviated with Fioricet. Patient states she had one instance of blurred vision that resolved after a few seconds. She denies any associated syncope, chest pain, shortness of breath, nausea, vomiting, numbness or weakness.   Past Medical History  Diagnosis Date  . Hypertension   . Migraine    Past Surgical History  Procedure Laterality Date  . Cesarean section    . Appendectomy    . Parathyroidectomy    . Laminectomy    . Coronary angioplasty    . Abdominal hysterectomy     No family history on file. History  Substance Use Topics  . Smoking status: Former Research scientist (life sciences)  . Smokeless tobacco: Not on file  . Alcohol Use: Yes     Comment: occasional   OB History    No data available     Review of Systems  Neurological: Positive for headaches.  All other systems reviewed and are negative.     Allergies  Dilaudid and Morphine and related  Home Medications   Prior to Admission medications   Medication Sig Start Date End Date Taking? Authorizing Provider  ALPRAZolam Duanne Moron) 0.5 MG tablet Take 1 tablet every night at bedtime 05/08/13  Yes Tiffany L Reed, DO  amLODipine (NORVASC) 5 MG tablet Take 5 mg by mouth daily.    Historical Provider, MD  aspirin 81 MG tablet Take 81 mg by mouth daily.   Yes Historical Provider, MD  atorvastatin (LIPITOR) 40 MG tablet Take 40 mg by mouth daily.   Yes Historical Provider, MD   butalbital-acetaminophen-caffeine (FIORICET WITH CODEINE) 50-325-40-30 MG per capsule Take 1 capsule by mouth every 4 (four) hours as needed for headache.   Yes Historical Provider, MD  cephALEXin (KEFLEX) 500 MG capsule Take 1 capsule (500 mg total) by mouth 4 (four) times daily. 10/23/12   Montine Circle, PA-C  diltiazem (CARDIZEM CD) 180 MG 24 hr capsule Take 180 mg by mouth daily.   Yes Historical Provider, MD  FLUoxetine (PROZAC) 40 MG capsule Take 40 mg by mouth daily.   Yes Historical Provider, MD  HYDROcodone-acetaminophen (NORCO/VICODIN) 5-325 MG per tablet Take 2 tablets by mouth every 6 (six) hours as needed for pain. 10/23/12   Montine Circle, PA-C  lisinopril (PRINIVIL,ZESTRIL) 5 MG tablet Take 5 mg by mouth daily.   Yes Historical Provider, MD  metoprolol (LOPRESSOR) 50 MG tablet Take 50 mg by mouth 2 (two) times daily.   Yes Historical Provider, MD  omeprazole (PRILOSEC) 40 MG capsule Take 40 mg by mouth daily.   Yes Historical Provider, MD  oxyCODONE (OXY IR/ROXICODONE) 5 MG immediate release tablet Take one tablet by mouth every 3 hours as needed for pain 05/18/13   Tiffany L Reed, DO  oxyCODONE-acetaminophen (ROXICET) 5-325 MG per tablet Take one tablet by mouth every four hours as needed for pain. 04/21/13   Blanchie Serve, MD  ranolazine (RANEXA) 500 MG 12 hr tablet Take 500 mg by mouth 2 (two) times daily.  Yes Historical Provider, MD  torsemide (DEMADEX) 20 MG tablet Take 20 mg by mouth daily.   Yes Historical Provider, MD  traMADol (ULTRAM) 50 MG tablet Take by mouth every 6 (six) hours as needed.   Yes Historical Provider, MD  traZODone (DESYREL) 50 MG tablet Take 50 mg by mouth at bedtime.   Yes Historical Provider, MD   BP 157/71 mmHg  Pulse 83  Temp(Src) 98.4 F (36.9 C) (Oral)  Resp 20  Ht 5\' 4"  (1.626 m)  Wt 200 lb (90.719 kg)  BMI 34.31 kg/m2  SpO2 99% Physical Exam  Constitutional: She is oriented to person, place, and time. She appears well-developed and  well-nourished. No distress.  HENT:  Head: Normocephalic and atraumatic.  Right Ear: External ear normal.  Left Ear: External ear normal.  Nose: Nose normal.  Mouth/Throat: Oropharynx is clear and moist. No oropharyngeal exudate.  Eyes: Conjunctivae and EOM are normal. Pupils are equal, round, and reactive to light.  Neck: Normal range of motion. Neck supple.  Cardiovascular: Normal rate, regular rhythm, normal heart sounds and intact distal pulses.   Pulmonary/Chest: Effort normal and breath sounds normal. No respiratory distress.  Abdominal: Soft. There is no tenderness.  Neurological: She is alert and oriented to person, place, and time. She has normal strength. No cranial nerve deficit. Gait normal. GCS eye subscore is 4. GCS verbal subscore is 5. GCS motor subscore is 6.  Sensation grossly intact.  No pronator drift.  Bilateral heel-knee-shin intact.  Skin: Skin is warm and dry. She is not diaphoretic.  Nursing note and vitals reviewed.   ED Course  Procedures (including critical care time) Medications  metoCLOPramide (REGLAN) injection 10 mg (10 mg Intravenous Given 05/04/15 1721)  diphenhydrAMINE (BENADRYL) injection 25 mg (25 mg Intravenous Given 05/04/15 1718)  sodium chloride 0.9 % bolus 500 mL (0 mLs Intravenous Stopped 05/04/15 1814)  HYDROmorphone (DILAUDID) injection 1 mg (1 mg Intravenous Given 05/04/15 1821)  sodium chloride 0.9 % bolus 500 mL (0 mLs Intravenous Stopped 05/04/15 1853)  ketorolac (TORADOL) 15 MG/ML injection 15 mg (15 mg Intravenous Given 05/04/15 1851)    Labs Review Labs Reviewed  BASIC METABOLIC PANEL - Abnormal; Notable for the following:    Glucose, Bld 114 (*)    BUN 24 (*)    Creatinine, Ser 1.03 (*)    GFR calc non Af Amer 53 (*)    All other components within normal limits  CBC WITH DIFFERENTIAL/PLATELET  URINALYSIS, ROUTINE W REFLEX MICROSCOPIC (NOT AT [facility])    Imaging Review Ct Head Wo Contrast  05/04/2015   CLINICAL DATA:  Severe headache  today.  Hypertension  EXAM: CT HEAD WITHOUT CONTRAST  TECHNIQUE: Contiguous axial images were obtained from the base of the skull through the vertex without intravenous contrast.  COMPARISON:  CT head 04/16/2010  FINDINGS: Ventricle size normal. Cerebral volume normal for age. Mild chronic microvascular ischemic change in the white matter.  Ill-defined hypodensity right centrum semiovale not present previously and could represent acute or chronic ischemia.  Negative for intracranial hemorrhage.  Negative for mass or edema  Atherosclerotic calcification in the cavernous carotid bilaterally. Calvarium intact.  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   Electronically Signed   By: Franchot Gallo M.D.   On: 05/04/2015 17:21     EKG Interpretation None      MDM   Final diagnoses:  Acute nonintractable headache, unspecified headache type  Filed Vitals:   05/04/15 1952  BP: 157/71  Pulse: 83  Temp: 98.4 F (36.9 C)  Resp: 20   Afebrile, NAD, non-toxic appearing, AAOx4. I have reviewed nursing notes, vital signs, and all appropriate lab and imaging results if ordered as above.    Pt HA treated and improved while in ED.  Presentation is lnon concerning for [facility], ICH, Meningitis, or temporal arteritis. Pt is afebrile with no focal neuro deficits, nuchal rigidity, or change in vision. CT scan is unremarkable for acute emergent cause of headache. Questionable acute vs chronic ischemic change, likely non acute given patient has no history of neurologic deficit or deficit on examination. Did discuss need for outpatient follow up for further testing. No evidence of hypertensive urgency or emergency. Pt is to follow up with PCP to discuss prophylactic medication. Pt verbalizes understanding and is agreeable with plan to dc. Patient d/w with Dr. Jeneen Rinks, agrees with plan.      Baron Sane, PA-C 05/04/15  2102  Tanna Furry, MD 05/13/15 [phone]

## 2015-05-04 NOTE — ED Notes (Signed)
Jeneen Rinks MD at bedside.

## 2015-05-04 NOTE — ED Notes (Signed)
Pt sts she awoke this a.m. With a severe HA. She also c/o bp issues.

## 2015-05-04 NOTE — Discharge Instructions (Signed)
Please follow up with your primary care physician in 1-2 days. If you do not have one please call the [facility] number listed above. Please read all discharge instructions and return precautions.  ° °Migraine Headache °A migraine headache is an intense, throbbing pain on one or both sides of your head. A migraine can last for 30 minutes to several hours. °CAUSES  °The exact cause of a migraine headache is not always known. However, a migraine may be caused when nerves in the brain become irritated and release chemicals that cause inflammation. This causes pain. °Certain things may also trigger migraines, such as: °· Alcohol. °· Smoking. °· Stress. °· Menstruation. °· Aged cheeses. °· Foods or drinks that contain nitrates, glutamate, aspartame, or tyramine. °· Lack of sleep. °· Chocolate. °· Caffeine. °· Hunger. °· Physical exertion. °· Fatigue. °· Medicines used to treat chest pain (nitroglycerine), birth control pills, estrogen, and some blood pressure medicines. °SIGNS AND SYMPTOMS °· Pain on one or both sides of your head. °· Pulsating or throbbing pain. °· Severe pain that prevents daily activities. °· Pain that is aggravated by any physical activity. °· Nausea, vomiting, or both. °· Dizziness. °· Pain with exposure to bright lights, loud noises, or activity. °· General sensitivity to bright lights, loud noises, or smells. °Before you get a migraine, you may get warning signs that a migraine is coming (aura). An aura may include: °· Seeing flashing lights. °· Seeing bright spots, halos, or zigzag lines. °· Having tunnel vision or blurred vision. °· Having feelings of numbness or tingling. °· Having trouble talking. °· Having muscle weakness. °DIAGNOSIS  °A migraine headache is often diagnosed based on: °· Symptoms. °· Physical exam. °· A CT scan or MRI of your head. These imaging tests cannot diagnose migraines, but they can help rule out other causes of headaches. °TREATMENT °Medicines may  be given for pain and nausea. Medicines can also be given to help prevent recurrent migraines.  °HOME CARE INSTRUCTIONS °· Only take over-the-counter or prescription medicines for pain or discomfort as directed by your health care provider. The use of long-term narcotics is not recommended. °· Lie down in a dark, quiet room when you have a migraine. °· Keep a journal to find out what may trigger your migraine headaches. For example, write down: °¨ What you eat and drink. °¨ How much sleep you get. °¨ Any change to your diet or medicines. °· Limit alcohol consumption. °· Quit smoking if you smoke. °· Get 7-9 hours of sleep, or as recommended by your health care provider. °· Limit stress. °· Keep lights dim if bright lights bother you and make your migraines worse. °SEEK IMMEDIATE MEDICAL CARE IF:  °· Your migraine becomes severe. °· You have a fever. °· You have a stiff neck. °· You have vision loss. °· You have muscular weakness or loss of muscle control. °· You start losing your balance or have trouble walking. °· You feel faint or pass out. °· You have severe symptoms that are different from your first symptoms. °MAKE SURE YOU:  °· Understand these instructions. °· Will watch your condition. °· Will get help right away if you are not doing well or get worse. °Document Released: 11/12/2005 Document Revised: 03/29/2014 Document Reviewed: 07/20/2013 °Exit[employer]® Patient Information ©2015 [employer]. This information is not intended to replace advice given to you by your health care provider. Make sure you discuss any questions you have with your health care provider. ° °

## 2015-05-04 NOTE — ED Notes (Addendum)
Pt's daughter arrived at bedside after dilaudid given per PA order- she reports pt has had adverse reaction to dilaudid in the past after surgeries, causing hallucinations and confusion. Chart updated and EDP Jeneen Rinks notified

## 2015-05-12 ENCOUNTER — Emergency Department (HOSPITAL_BASED_OUTPATIENT_CLINIC_OR_DEPARTMENT_OTHER)
Admission: EM | Admit: 2015-05-12 | Discharge: 2015-05-13 | Disposition: A | Discharge status: Other Acute Inpatient Hospital | Payer: PPO | Attending: Emergency Medicine | Admitting: Emergency Medicine

## 2015-05-12 ENCOUNTER — Encounter (HOSPITAL_BASED_OUTPATIENT_CLINIC_OR_DEPARTMENT_OTHER): Payer: Self-pay | Admitting: *Deleted

## 2015-05-12 ENCOUNTER — Emergency Department (HOSPITAL_BASED_OUTPATIENT_CLINIC_OR_DEPARTMENT_OTHER): Payer: PPO

## 2015-05-12 DIAGNOSIS — F32A Depression, unspecified: Secondary | ICD-10-CM

## 2015-05-12 DIAGNOSIS — F329 Major depressive disorder, single episode, unspecified: Secondary | ICD-10-CM | POA: Diagnosis not present

## 2015-05-12 DIAGNOSIS — I10 Essential (primary) hypertension: Secondary | ICD-10-CM | POA: Insufficient documentation

## 2015-05-12 DIAGNOSIS — R45851 Suicidal ideations: Secondary | ICD-10-CM | POA: Diagnosis not present

## 2015-05-12 HISTORY — DX: Major depressive disorder, single episode, unspecified: F32.9

## 2015-05-12 HISTORY — DX: Depression, unspecified: F32.A

## 2015-05-12 LAB — CBC
HCT: 38.2 % (ref 36.0–46.0)
HEMOGLOBIN: 12.3 g/dL (ref 12.0–15.0)
MCH: 30.1 pg (ref 26.0–34.0)
MCHC: 32.2 g/dL (ref 30.0–36.0)
MCV: 93.6 fL (ref 78.0–100.0)
PLATELETS: 239 10*3/uL (ref 150–400)
RBC: 4.08 MIL/uL (ref 3.87–5.11)
RDW: 14.9 % (ref 11.5–15.5)
WBC: 6.3 10*3/uL (ref 4.0–10.5)

## 2015-05-12 LAB — RAPID URINE DRUG SCREEN, HOSP PERFORMED
Amphetamines: NOT DETECTED
BENZODIAZEPINES: POSITIVE — AB
Barbiturates: POSITIVE — AB
COCAINE: NOT DETECTED
Opiates: NOT DETECTED
Tetrahydrocannabinol: NOT DETECTED

## 2015-05-12 LAB — COMPREHENSIVE METABOLIC PANEL
ALT: 16 U/L (ref 14–54)
AST: 17 U/L (ref 15–41)
Albumin: 3.5 g/dL (ref 3.5–5.0)
Alkaline Phosphatase: 79 U/L (ref 38–126)
Anion gap: 12 (ref 5–15)
BUN: 16 mg/dL (ref 6–20)
CALCIUM: 8.6 mg/dL — AB (ref 8.9–10.3)
CO2: 24 mmol/L (ref 22–32)
CREATININE: 0.97 mg/dL (ref 0.44–1.00)
Chloride: 105 mmol/L (ref 101–111)
GFR calc non Af Amer: 57 mL/min — ABNORMAL LOW (ref >60)
Glucose, Bld: 112 mg/dL — ABNORMAL HIGH (ref 65–99)
Potassium: 3.2 mmol/L — ABNORMAL LOW (ref 3.5–5.1)
SODIUM: 141 mmol/L (ref 135–145)
Total Bilirubin: 0.4 mg/dL (ref 0.3–1.2)
Total Protein: 6.4 g/dL — ABNORMAL LOW (ref 6.5–8.1)

## 2015-05-12 LAB — ETHANOL: Alcohol, Ethyl (B): <5 mg/dL (ref <5)

## 2015-05-12 LAB — ACETAMINOPHEN LEVEL

## 2015-05-12 LAB — SALICYLATE LEVEL: SALICYLATE LVL: 10.2 mg/dL (ref 2.8–30.0)

## 2015-05-12 MED ORDER — LISINOPRIL 5 MG PO TABS
5.0000 mg | ORAL_TABLET | Freq: Every day | ORAL | Status: DC
  Administered 2015-05-13 (×2): 5 mg via ORAL
  Filled 2015-05-12 (×2): qty 1

## 2015-05-12 MED ORDER — ASPIRIN 81 MG PO CHEW
81.0000 mg | CHEWABLE_TABLET | Freq: Every day | ORAL | Status: DC
  Administered 2015-05-13: 81 mg via ORAL
  Filled 2015-05-12: qty 1

## 2015-05-12 MED ORDER — ONDANSETRON HCL 4 MG PO TABS
4.0000 mg | ORAL_TABLET | Freq: Three times a day (TID) | ORAL | Status: DC | PRN
Start: 2015-05-12 — End: 2015-05-13

## 2015-05-12 MED ORDER — FLUOXETINE HCL 20 MG PO CAPS
40.0000 mg | ORAL_CAPSULE | Freq: Every day | ORAL | Status: DC
  Administered 2015-05-13: 40 mg via ORAL
  Filled 2015-05-12: qty 2

## 2015-05-12 MED ORDER — DILTIAZEM HCL ER COATED BEADS 180 MG PO CP24
180.0000 mg | ORAL_CAPSULE | Freq: Every day | ORAL | Status: DC
  Administered 2015-05-13 (×2): 180 mg via ORAL
  Filled 2015-05-12 (×2): qty 1

## 2015-05-12 MED ORDER — POTASSIUM CHLORIDE CRYS ER 20 MEQ PO TBCR
40.0000 meq | EXTENDED_RELEASE_TABLET | Freq: Once | ORAL | Status: AC
  Administered 2015-05-12: 40 meq via ORAL
  Filled 2015-05-12: qty 2

## 2015-05-12 MED ORDER — ACETAMINOPHEN 325 MG PO TABS
650.0000 mg | ORAL_TABLET | ORAL | Status: DC | PRN
  Administered 2015-05-13: 650 mg via ORAL
  Filled 2015-05-12: qty 2

## 2015-05-12 MED ORDER — BUTALBITAL-APAP-CAFF-COD 50-325-40-30 MG PO CAPS: 1.0000 | ORAL_CAPSULE | ORAL | Status: DC | PRN

## 2015-05-12 MED ORDER — TORSEMIDE 20 MG PO TABS
20.0000 mg | ORAL_TABLET | Freq: Every day | ORAL | Status: DC
  Administered 2015-05-13: 20 mg via ORAL
  Filled 2015-05-12 (×2): qty 1

## 2015-05-12 MED ORDER — RANOLAZINE ER 500 MG PO TB12
500.0000 mg | ORAL_TABLET | Freq: Two times a day (BID) | ORAL | Status: DC
  Administered 2015-05-13 (×2): 500 mg via ORAL
  Filled 2015-05-12 (×3): qty 1

## 2015-05-12 MED ORDER — AMLODIPINE BESYLATE 5 MG PO TABS
5.0000 mg | ORAL_TABLET | Freq: Every day | ORAL | Status: DC
  Administered 2015-05-13 (×2): 5 mg via ORAL
  Filled 2015-05-12 (×2): qty 1

## 2015-05-12 MED ORDER — ASPIRIN EC 81 MG PO TBEC
DELAYED_RELEASE_TABLET | ORAL | Status: AC
  Administered 2015-05-13: 81 mg
  Filled 2015-05-12: qty 1

## 2015-05-12 MED ORDER — LORAZEPAM 1 MG PO TABS
1.0000 mg | ORAL_TABLET | Freq: Three times a day (TID) | ORAL | Status: DC | PRN
  Administered 2015-05-13: 1 mg via ORAL
  Filled 2015-05-12: qty 1

## 2015-05-12 MED ORDER — TRAZODONE HCL 50 MG PO TABS
50.0000 mg | ORAL_TABLET | Freq: Every day | ORAL | Status: DC
  Filled 2015-05-12: qty 1

## 2015-05-12 MED ORDER — ACETAMINOPHEN 325 MG PO TABS
650.0000 mg | ORAL_TABLET | Freq: Once | ORAL | Status: AC
  Administered 2015-05-12: 650 mg via ORAL
  Filled 2015-05-12: qty 2

## 2015-05-12 MED ORDER — OXYCODONE-ACETAMINOPHEN 5-325 MG PO TABS
1.0000 | ORAL_TABLET | Freq: Once | ORAL | Status: AC
  Administered 2015-05-12: 1 via ORAL
  Filled 2015-05-12: qty 1

## 2015-05-12 MED ORDER — METOPROLOL TARTRATE 25 MG PO TABS
50.0000 mg | ORAL_TABLET | Freq: Two times a day (BID) | ORAL | Status: DC
  Administered 2015-05-13 (×2): 50 mg via ORAL
  Filled 2015-05-12: qty 1
  Filled 2015-05-12 (×2): qty 2

## 2015-05-12 NOTE — ED Notes (Signed)
Update received from [place] at [facility]. Stephanie Mckenzie has 1 female bed available. Pt is a possible candidate. Items needed chest xray and ekg. This RN will fax info to [place]. [phone]

## 2015-05-12 NOTE — ED Notes (Signed)
Pt informed that her daughter is in our waiting area. Pt states she would like to see her daughter.

## 2015-05-12 NOTE — ED Notes (Signed)
All cash inventoried by [employer], [place]. Placed in safe in security office.

## 2015-05-12 NOTE — BH Assessment (Signed)
Per Dr. Parke Poisson - patient meets criteria for inpatient hospitalization.

## 2015-05-12 NOTE — ED Notes (Addendum)
As CN: [facility] and [facility] consulted. This RN spoke with pt in private, away from family with primary RN present, about her desire to sign over $2400 (cash in a signed and sealed in a [employer] bank envelope) and $25 (in cash) to her family (daughter: Ailene Ravel First). She desires to sign over and give cash to family at this time.  Chart and assessment reviewed. Pt is assessed as: coherent, relevant, A&Ox4, with good judgement and w/o delusions or hallucinations. Cash in secure envelope received from security, turned over to daughter. Witnessed and signed by: Security, primary RN, CN, daughter and patient. Purse with non-valuables given to family as well. Clothing and sandals remain secure at nurses station. Sitter remains at [facility]. Pt alert, NAD, calm, interactive, resting in stretcher, speaking with staff and watching TV.

## 2015-05-12 NOTE — ED Notes (Signed)
Pt speaking with TTS, pt is teary.

## 2015-05-12 NOTE — Progress Notes (Addendum)
Patient was referred for IP geriatric treatment at: [facility] - per Nunzio Cory, fax referral for tomorrow. [facility] - per [place], 1 female bed open. [facility] - per intake, fax it for review. [facility] (fax# [phone]) - per Danise Mina, fax referral for review, EKG and chest xray needed for referral. RN Brandi informed.               EKG and chest xray faxed to [facility] by Probation officer. [facility] - per intake, fax referral. [facility] - per Caryl Pina, fax referral for the am. [facility] - per intake, fax for review. [facility] - per intake, ok to fax it, d/c tomorrow.   Verlon Setting, [place] Dis[facility] staff 05/12/2015 10:37 PM

## 2015-05-12 NOTE — ED Notes (Signed)
Call placed to [facility] in staffing for sitter in room 12.

## 2015-05-12 NOTE — BH Assessment (Addendum)
Assessment Note   Patient is a 74 year-old white female that wear hearing aids and lives alone with her dog.   Patient reports SI with a plan to take on overdose on her medication.  Patient reports increased depression associated with her children telling her that she may need to go into an assisted living facility because she is not able to live by herself.  Patient reports that she has been severely depressed over the past 4 years since the death of her husband. Patient reports that, "I would've checked out 4 years ago that wasn't for my daughter".   Patient reports that she is able to live by herself and her children are upset because the dog is not trained to use the bathroom outside.  Patient reports a history of depression and taking Prozac from the medical doctor for the past 30 years.  Patient reports that she has been compliant with taking her medication.  However, she is still feeling suicidal.  Patient reports that she has not slept for the past 3 nights and she has not been eating very well.   Patient denies prior psychiatric hospitalizations.  Patient denies physical, sexual or emotional abuse.  Patient denies substances. Patient denies HI/Psychosis.     Axis I: Major Depression, single episode Axis II: Deferred Axis III:  Past Medical History  Diagnosis Date  . Hypertension   . Migraine   . Depression    Axis IV: economic problems, other psychosocial or environmental problems, problems related to social environment, problems with access to health care services and problems with primary support group Axis V: 31-40 impairment in reality testing  Past Medical History:  Past Medical History  Diagnosis Date  . Hypertension   . Migraine   . Depression     Past Surgical History  Procedure Laterality Date  . Cesarean section    . Appendectomy    . Parathyroidectomy    . Laminectomy    . Coronary angioplasty    . Abdominal hysterectomy      Family History: No family  history on file.  Social History:  reports that she has quit smoking. She does not have any smokeless tobacco history on file. She reports that she drinks alcohol. She reports that she does not use illicit drugs.  Additional Social History:  Alcohol / Drug Use History of alcohol / drug use?: No history of alcohol / drug abuse  CIWA: CIWA-Ar BP: 151/71 mmHg Pulse Rate: 92 COWS:    Allergies:  Allergies  Allergen Reactions  . Dilaudid [Hydromorphone] Other (See Comments)    Causes confusion and hallucinations per pt's daughter  . Morphine And Related Itching    Home Medications:  (Not in a hospital admission)  OB/GYN Status:  No LMP recorded. Patient has had a hysterectomy.  General Assessment Data Location of Assessment:  ([place] ) TTS Assessment: In system Is this a Tele or Face-to-Face Assessment?: Tele Assessment Is this an Initial Assessment or a Re-assessment for this encounter?: Initial Assessment Marital status: Divorced [place] name: NA Is patient pregnant?: No Pregnancy Status: No Living Arrangements: Alone (Patient reports that she lives with her dog. ) Can pt return to current living arrangement?: Yes Admission Status: Voluntary Is patient capable of signing voluntary admission?: Yes Referral Source: Self/Family/Friend Insurance type: Health Team  Medical Screening Exam ([place]) Medical Exam completed: Yes  Crisis Care Plan Living Arrangements: Alone (Patient reports that she lives with her dog. ) Name of Psychiatrist:  Unable to remember the doctors name Name of Therapist: None Reported  Education Status Is patient currently in school?: No Current Grade: NA Highest grade of school patient has completed: NA Name of school: NA Contact person: NA  Risk to self with the past 6 months Suicidal Ideation: Yes-Currently Present Has patient been a risk to self within the past 6 months prior to admission? : Yes Suicidal Intent:  Yes-Currently Present Has patient had any suicidal intent within the past 6 months prior to admission? : Yes Is patient at risk for suicide?: Yes Suicidal Plan?: Yes-Currently Present Has patient had any suicidal plan within the past 6 months prior to admission? : Yes Specify Current Suicidal Plan: Overdose on meds Access to Means: Yes Specify Access to Suicidal Means: Pills What has been your use of drugs/alcohol within the last 12 months?: None Reported Previous Attempts/Gestures: No How many times?: 0 Other Self Harm Risks: None Reported Triggers for Past Attempts:  (NA) Intentional Self Injurious Behavior: None Family Suicide History: No Recent stressful life event(s): Financial Problems (Afraid that her kids will place her in an AFL.) Persecutory voices/beliefs?: No Depression: Yes Depression Symptoms: Despondent, Tearfulness, Isolating, Fatigue, Guilt, Loss of interest in usual pleasures, Feeling worthless/self pity Substance abuse history and/or treatment for substance abuse?: No Suicide prevention information given to non-admitted patients: Yes  Risk to Others within the past 6 months Homicidal Ideation: No Does patient have any lifetime risk of violence toward others beyond the six months prior to admission? : No Thoughts of Harm to Others: No Current Homicidal Intent: No Current Homicidal Plan: No Access to Homicidal Means: No Identified Victim: NA History of harm to others?: No Assessment of Violence: None Noted Violent Behavior Description: NA Does patient have access to weapons?: No Criminal Charges Pending?: No Does patient have a court date: No Is patient on probation?: No  Psychosis Hallucinations: None noted Delusions: None noted  Mental Status Report Appearance/Hygiene: Disheveled Eye Contact: Fair Motor Activity: Freedom of movement, Restlessness Speech: Logical/coherent Level of Consciousness: Alert Mood: Depressed, Anxious, Suspicious, Helpless,  Worthless, low self-esteem Affect: Anxious, Blunted, Depressed, Sad, Sullen Anxiety Level: Minimal Thought Processes: Coherent, Relevant Judgement: Unimpaired Orientation: Person, Place, Time, Situation Obsessive Compulsive Thoughts/Behaviors: None  Cognitive Functioning Concentration: Decreased Memory: Recent Intact, Remote Intact IQ: Average Insight: Fair Impulse Control: Poor Appetite: Poor Weight Loss: 0 Weight Gain: 0 Sleep: Increased Total Hours of Sleep: 12 Vegetative Symptoms: Decreased grooming, Not bathing, Staying in bed  ADLScreening [facility] Assessment Services) Patient's cognitive ability adequate to safely complete daily activities?: Yes Patient able to express need for assistance with ADLs?: No Independently performs ADLs?: Yes (appropriate for developmental age)  Prior Inpatient Therapy Prior Inpatient Therapy: No Prior Therapy Dates: NA Prior Therapy Facilty/Provider(s): NA Reason for Treatment: NA  Prior Outpatient Therapy Prior Outpatient Therapy: Yes Prior Therapy Dates: [place]  Prior Therapy Facilty/Provider(s): Unable to remember  (Her doctor in [facility]) Reason for Treatment: Medication Management  Does patient have an ACCT team?: No Does patient have Intensive In-House Services?  : No Does patient have [facility] services? : No Does patient have P4CC services?: No  ADL Screening (condition at time of admission) Patient's cognitive ability adequate to safely complete daily activities?: Yes Is the patient deaf or have difficulty hearing?: No Does the patient have difficulty seeing, even when wearing glasses/contacts?: No Does the patient have difficulty concentrating, remembering, or making decisions?: Yes Patient able to express need for assistance with ADLs?: No Does the patient have difficulty  dressing or bathing?: No Independently performs ADLs?: Yes (appropriate for developmental age) Does the patient have difficulty walking or climbing  stairs?: No Weakness of Legs: None Weakness of Arms/Hands: None  Home Assistive Devices/Equipment Home Assistive Devices/Equipment: None    Abuse/Neglect Assessment (Assessment to be complete while patient is alone) Physical Abuse: Denies Verbal Abuse: Denies Sexual Abuse: Denies Exploitation of patient/patient's resources: Denies Self-Neglect: Denies Values / Beliefs Cultural Requests During Hospitalization: None Spiritual Requests During Hospitalization: None Consults Spiritual Care Consult Needed: No Social Work Consult Needed: No Regulatory affairs officer (For Healthcare) Does patient have an advance directive?: No Would patient like information on creating an advanced directive?: No - patient declined information    Additional Information 1:1 In Past 12 Months?: No CIRT Risk: No Elopement Risk: No Does patient have medical clearance?: Yes     Disposition: Per Dr. Parke Poisson patient meet [facility] for [employer].   CSW, Loreli Dollar will seek placement.   Disposition Initial Assessment Completed for this Encounter: Yes Disposition of Patient: Inpatient treatment program Type of inpatient treatment program: Adult [facility] )  On Site Evaluation by:   Reviewed with Physician:    Graciella Freer [place] 05/12/2015 7:01 PM

## 2015-05-12 NOTE — ED Notes (Signed)
Medical clearance. Depression. States she is having SI and would take a bottle of Xanax.

## 2015-05-12 NOTE — ED Notes (Addendum)
This rn speaking with pt daughter in waiting area, daughter wanded, purse and cell phone given to security for holding. Daughter walked to room with this rn.

## 2015-05-12 NOTE — ED Notes (Signed)
All medications counted and logged by this rn and [employer], rn.

## 2015-05-12 NOTE — ED Provider Notes (Signed)
CSN: [id number]     Arrival date & time 05/12/15  1658 History   First MD Initiated Contact with Patient 05/12/15 1708     Chief Complaint  Patient presents with  . Medical Clearance  . Depression     (Consider location/radiation/quality/duration/timing/severity/associated sxs/prior Treatment) HPI  74 year old female who presents today with chief complaint of depression. She has a history of depression going back 30 years. She has been on Prozac. She states that there have been times when she has felt better and worse. She states this is the worst that she has been. She she's been crying constantly. She has had some suicidal ideation with a possible plan of taking a bottle of Xanax. She has not tried to harm herself in the past. She has not slept for the past 3 nights. She has not been eating very well. She denies that she has ever been hospitalized in past. She has been continuing to take her usual dose of Prozac. She has taken Xanax at bedtime as per usual.  Past Medical History  Diagnosis Date  . Hypertension   . Migraine   . Depression    Past Surgical History  Procedure Laterality Date  . Cesarean section    . Appendectomy    . Parathyroidectomy    . Laminectomy    . Coronary angioplasty    . Abdominal hysterectomy     No family history on file. History  Substance Use Topics  . Smoking status: Former Research scientist (life sciences)  . Smokeless tobacco: Not on file  . Alcohol Use: Yes     Comment: occasional   OB History    No data available     Review of Systems  All other systems reviewed and are negative.     Allergies  Dilaudid and Morphine and related  Home Medications   Prior to Admission medications   Medication Sig Start Date End Date Taking? Authorizing Provider  ALPRAZolam Duanne Moron) 0.5 MG tablet Take 1 tablet every night at bedtime 05/08/13   Tiffany L Reed, DO  amLODipine (NORVASC) 5 MG tablet Take 5 mg by mouth daily.    Historical Provider, MD  aspirin 81 MG tablet Take  81 mg by mouth daily.    Historical Provider, MD  atorvastatin (LIPITOR) 40 MG tablet Take 40 mg by mouth daily.    Historical Provider, MD  butalbital-acetaminophen-caffeine (FIORICET WITH CODEINE) 50-325-40-30 MG per capsule Take 1 capsule by mouth every 4 (four) hours as needed for headache.    Historical Provider, MD  cephALEXin (KEFLEX) 500 MG capsule Take 1 capsule (500 mg total) by mouth 4 (four) times daily. 10/23/12   Montine Circle, PA-C  diltiazem (CARDIZEM CD) 180 MG 24 hr capsule Take 180 mg by mouth daily.    Historical Provider, MD  FLUoxetine (PROZAC) 40 MG capsule Take 40 mg by mouth daily.    Historical Provider, MD  HYDROcodone-acetaminophen (NORCO/VICODIN) 5-325 MG per tablet Take 2 tablets by mouth every 6 (six) hours as needed for pain. 10/23/12   Montine Circle, PA-C  lisinopril (PRINIVIL,ZESTRIL) 5 MG tablet Take 5 mg by mouth daily.    Historical Provider, MD  metoprolol (LOPRESSOR) 50 MG tablet Take 50 mg by mouth 2 (two) times daily.    Historical Provider, MD  omeprazole (PRILOSEC) 40 MG capsule Take 40 mg by mouth daily.    Historical Provider, MD  oxyCODONE (OXY IR/ROXICODONE) 5 MG immediate release tablet Take one tablet by mouth every 3 hours as needed for pain 05/18/13  Tiffany L Reed, DO  oxyCODONE-acetaminophen (ROXICET) 5-325 MG per tablet Take one tablet by mouth every four hours as needed for pain. 04/21/13   Blanchie Serve, MD  ranolazine (RANEXA) 500 MG 12 hr tablet Take 500 mg by mouth 2 (two) times daily.    Historical Provider, MD  torsemide (DEMADEX) 20 MG tablet Take 20 mg by mouth daily.    Historical Provider, MD  traMADol (ULTRAM) 50 MG tablet Take by mouth every 6 (six) hours as needed.    Historical Provider, MD  traZODone (DESYREL) 50 MG tablet Take 50 mg by mouth at bedtime.    Historical Provider, MD   BP 148/101 mmHg  Pulse 102  Temp(Src) 98.2 F (36.8 C) (Oral)  Resp 14  Ht 5\' 4"  (1.626 m)  Wt 200 lb (90.719 kg)  BMI 34.31 kg/m2  SpO2  99% Physical Exam  Constitutional: She is oriented to person, place, and time. She appears well-developed and well-nourished. She appears distressed.  HENT:  Head: Normocephalic.  Eyes: Conjunctivae and EOM are normal. Pupils are equal, round, and reactive to light.  Neck: Normal range of motion. Neck supple.  Cardiovascular: Normal rate and regular rhythm.   Pulmonary/Chest: Effort normal and breath sounds normal.  Abdominal: Soft. Bowel sounds are normal.  Musculoskeletal: Normal range of motion.  Neurological: She is alert and oriented to person, place, and time. She has normal reflexes. She displays normal reflexes. No cranial nerve deficit. Coordination normal.  Skin: Skin is warm and dry.  Psychiatric: Her speech is normal and behavior is normal. Judgment normal. Cognition and memory are normal. She exhibits a depressed mood. She expresses suicidal ideation. She expresses suicidal plans.  She is crying and tearful.  Nursing note and vitals reviewed.   ED Course  Procedures (including critical care time) Labs Review Labs Reviewed  ACETAMINOPHEN LEVEL - Abnormal; Notable for the following:    Acetaminophen (Tylenol), Serum <10 (*)    All other components within normal limits  COMPREHENSIVE METABOLIC PANEL - Abnormal; Notable for the following:    Potassium 3.2 (*)    Glucose, Bld 112 (*)    Calcium 8.6 (*)    Total Protein 6.4 (*)    GFR calc non Af Amer 57 (*)    All other components within normal limits  URINE RAPID DRUG SCREEN, HOSP PERFORMED - Abnormal; Notable for the following:    Benzodiazepines POSITIVE (*)    Barbiturates POSITIVE (*)    All other components within normal limits  CBC  ETHANOL  SALICYLATE LEVEL    Imaging Review No results found.   EKG Interpretation None      MDM   Final diagnoses:  Depression  Suicidal ideation    74 year old female who presents today with depression and suicidal ideation. She was seen and evaluated by psychiatric  service and they feel she is appropriate for admission. However, they do not think there'll be any beds available until at least in the morning. She has had psych holding orders placed. I discussed with her that she is currently voluntary but would likely, at this time, need to have involuntary commitment papers signed if she were to decide to leave. She voices understanding of this. Her daughters in the room and I have also discussed this with the daughter present.    Pattricia Boss, MD 05/12/15 2102

## 2015-05-12 NOTE — ED Notes (Addendum)
Pt being wanded by security, assisted into burgandy scrubs, belongings secured, John, EMT sitting with pt.

## 2015-05-13 DIAGNOSIS — R45851 Suicidal ideations: Secondary | ICD-10-CM | POA: Diagnosis not present

## 2015-05-13 DIAGNOSIS — F32A Depression, unspecified: Secondary | ICD-10-CM | POA: Insufficient documentation

## 2015-05-13 DIAGNOSIS — F329 Major depressive disorder, single episode, unspecified: Secondary | ICD-10-CM | POA: Insufficient documentation

## 2015-05-13 LAB — SALICYLATE LEVEL: Salicylate Lvl: <4 mg/dL (ref 2.8–30.0)

## 2015-05-13 MED ORDER — DILTIAZEM HCL ER COATED BEADS 180 MG PO CP24: 180.0000 mg | ORAL_CAPSULE | Freq: Every day | ORAL | Status: DC

## 2015-05-13 MED ORDER — AMLODIPINE BESYLATE 5 MG PO TABS: 5.0000 mg | ORAL_TABLET | Freq: Every day | ORAL | Status: DC

## 2015-05-13 MED ORDER — METOPROLOL TARTRATE 50 MG PO TABS: 50.0000 mg | ORAL_TABLET | Freq: Two times a day (BID) | ORAL | Status: DC

## 2015-05-13 MED ORDER — PANTOPRAZOLE SODIUM 40 MG PO TBEC
40.0000 mg | DELAYED_RELEASE_TABLET | Freq: Once | ORAL | Status: AC
  Administered 2015-05-13: 40 mg via ORAL
  Filled 2015-05-13: qty 1

## 2015-05-13 MED ORDER — DIPHENHYDRAMINE HCL 25 MG PO CAPS
25.0000 mg | ORAL_CAPSULE | Freq: Once | ORAL | Status: AC
  Administered 2015-05-13: 25 mg via ORAL
  Filled 2015-05-13: qty 1

## 2015-05-13 MED ORDER — FLUOXETINE HCL 40 MG PO CAPS: 40.0000 mg | ORAL_CAPSULE | Freq: Every day | ORAL | Status: DC

## 2015-05-13 MED ORDER — BUTALBITAL-APAP-CAFFEINE 50-325-40 MG PO TABS
1.0000 | ORAL_TABLET | ORAL | Status: DC | PRN
  Administered 2015-05-13: 1 via ORAL
  Filled 2015-05-13: qty 1

## 2015-05-13 MED ORDER — TORSEMIDE 20 MG PO TABS: 20.0000 mg | ORAL_TABLET | Freq: Every day | ORAL | Status: DC

## 2015-05-13 MED ORDER — TRAZODONE HCL 50 MG PO TABS: 50.0000 mg | ORAL_TABLET | Freq: Every day | ORAL | Status: DC

## 2015-05-13 MED ORDER — RANOLAZINE ER 500 MG PO TB12: 500.0000 mg | ORAL_TABLET | Freq: Two times a day (BID) | ORAL | Status: DC

## 2015-05-13 MED ORDER — ASPIRIN 81 MG PO TABS: 81.0000 mg | ORAL_TABLET | Freq: Every day | ORAL | Status: DC

## 2015-05-13 NOTE — ED Provider Notes (Signed)
Patient transferred from [facility] while awaiting placement for behavioral health.  Pt is stable  Linton Flemings, MD 05/13/15 [phone]

## 2015-05-13 NOTE — BHH Counselor (Signed)
Pt accepted to [facility] report [phone] Room #423B Accepting Stephanie Mckenzie. Voluntary consent faxed over to [facility], signed by pt.   Bedelia Person, M.S., LPCA, [place], [facility] Licensed Professional Counselor Associate  Triage Specialist  [facility]  Therapeutic Triage Services Phone: [phone] Fax: [phone]

## 2015-05-13 NOTE — ED Notes (Addendum)
persistent c/o HA and can't sleep. EDP notified. Orders received.

## 2015-05-13 NOTE — ED Notes (Addendum)
2 pt belonging bags placed in locker #28.

## 2015-05-13 NOTE — ED Notes (Addendum)
Pt reviewed all medications with this RN. Verified dosage and schedule. Pt is anxious and states "I just want to be right, I take 15 pillls a a day. I am not asking for any narcotics I just want my head to stop hurting." Pt requests her acid reflux medication. This RN will ask EPD for order to administer. Pt also requested trazadone and states "I have not been a sleep in 3 days, I take my trazadone and xanax together. This RN consulted EDP. Per EDP pt will not receive Trazadone with xanax (ativan).

## 2015-05-13 NOTE — ED Notes (Signed)
Pt calmer, resting more comfortably, TV turned off, lights dimmed, stimulus decreased, sleep encouraged, NAD, alert, sitter at [facility], pending arrival of [facility] transport.

## 2015-05-13 NOTE — ED Notes (Addendum)
[facility] here for transport, no changes. Preparing to leave.

## 2015-05-13 NOTE — Progress Notes (Signed)
Pt is pleasant and cooperative. She is very tearful. She stated,'My husband left me after 26 years of marriage. I feel so bad and have no energy. ""My children want me to go to an assisted living facilty and I do not want to go." pt stated she was not a candidate for a stent . She stated all the meds she takes make her so so tired. Pt is being transferred to [facility] via [facility]. Pt is cooperative and wondered if all the meds she takes make her sleepy. Report given to [place] . Daughter did visit the pt and stated her house is unlivable. She stated she and her brother spent hours havign a cleaning crew clean the house. Pt contracts for safety and denies Si and HI.

## 2015-05-13 NOTE — BH Assessment (Signed)
Pt is being reviewed for possible admission to [place] from [place] will be [facility] for more information about pt's ability to complete ADLs.  Lear Ng, [facility] Triage Specialist 05/13/2015 12:38 AM

## 2015-05-13 NOTE — Consult Note (Signed)
[facility] Consult   Reason for Consult:  Suicidal Referring Physician:  EDP Patient Identification: Stephanie Mckenzie MRN:  [id number] Principal Diagnosis: Suicidal ideation Diagnosis:   Patient Active Problem List   Diagnosis Date Noted  . MDD (major depressive disorder) [F32.2] 05/13/2015    Priority: High  . Suicidal ideation [R45.851]     Priority: High  . Depression [F32.9]   . Anxiety [F41.9] 06/09/2013  . Pain in joint, ankle and foot [M25.579] 06/09/2013  . GERD (gastroesophageal reflux disease) [K21.9] 06/09/2013  . Insomnia [G47.00] 06/09/2013  . Constipation [K59.00] 06/09/2013  . Depressive disorder, not elsewhere classified [F32.9] 05/22/2013  . Bimalleolar fracture [E56.314H] 05/07/2013  . Essential hypertension, benign [I10] 05/07/2013  . Insomnia, unspecified [G47.00] 05/07/2013  . Pain in limb [M79.609] 05/07/2013    Total Time spent with patient: 30 minutes  Subjective:   Stephanie Mckenzie is a 74 y.o. female patient admitted with reports of suicidal ideation with plan. Pt seen and chart reviewed by this NP and Dr. Darleene Cleaver. Pt reports that she is still suicidal with a plan. Pt continues to meet inpatient criteria; she was referred last night and accepted today at [facility].   HPI: Patient is a 74 year-old white female that wear hearing aids and lives alone with her dog.   Patient reports SI with a plan to take on overdose on her medication. Patient reports increased depression associated with her children telling her that she may need to go into an assisted living facility because she is not able to live by herself. Patient reports that she has been severely depressed over the past 4 years since the death of her husband. Patient reports that, "I would've checked out 4 years ago that wasn't for my daughter".   Patient reports that she is able to live by herself and her children are upset because the dog is not trained to use the  bathroom outside. Patient reports a history of depression and taking Prozac from the medical doctor for the past 30 years. Patient reports that she has been compliant with taking her medication. However, she is still feeling suicidal. Patient reports that she has not slept for the past 3 nights and she has not been eating very well.   Patient denies prior psychiatric hospitalizations. Patient denies physical, sexual or emotional abuse. Patient denies substances. Patient denies HI/Psychosis.   Past Medical History:  Past Medical History  Diagnosis Date  . Hypertension   . Migraine   . Depression     Past Surgical History  Procedure Laterality Date  . Cesarean section    . Appendectomy    . Parathyroidectomy    . Laminectomy    . Coronary angioplasty    . Abdominal hysterectomy     Family History: No family history on file. Social History:  History  Alcohol Use  . Yes    Comment: occasional     History  Drug Use No    History   Social History  . Marital Status: Divorced    Spouse Name: N/A  . Number of Children: N/A  . Years of Education: N/A   Social History Main Topics  . Smoking status: Former Research scientist (life sciences)  . Smokeless tobacco: Not on file  . Alcohol Use: Yes     Comment: occasional  . Drug Use: No  . Sexual Activity: Not on file   Other Topics Concern  . None   Social History Narrative   Additional Social History:  History of alcohol / drug use?: No history of alcohol / drug abuse                     Allergies:   Allergies  Allergen Reactions  . Acyclovir And Related   . Dilaudid [Hydromorphone] Other (See Comments)    Causes confusion and hallucinations per pt's daughter  . Morphine And Related Itching    Labs:  Results for orders placed or performed during the hospital encounter of 05/12/15 (from the past 48 hour(s))  Acetaminophen level     Status: Abnormal   Collection Time: 05/12/15  5:40 PM  Result Value Ref Range   Acetaminophen  (Tylenol), Serum <10 (L) 10 - 30 ug/mL    Comment:        THERAPEUTIC CONCENTRATIONS VARY SIGNIFICANTLY. A RANGE OF 10-30 ug/mL MAY BE AN EFFECTIVE CONCENTRATION FOR MANY PATIENTS. HOWEVER, SOME ARE BEST TREATED AT CONCENTRATIONS OUTSIDE THIS RANGE. ACETAMINOPHEN CONCENTRATIONS >150 ug/mL AT 4 HOURS AFTER INGESTION AND >50 ug/mL AT 12 HOURS AFTER INGESTION ARE OFTEN ASSOCIATED WITH TOXIC REACTIONS.   CBC     Status: None   Collection Time: 05/12/15  5:40 PM  Result Value Ref Range   WBC 6.3 4.0 - 10.5 K/uL   RBC 4.08 3.87 - 5.11 MIL/uL   Hemoglobin 12.3 12.0 - 15.0 g/dL   HCT 38.2 36.0 - 46.0 %   MCV 93.6 78.0 - 100.0 fL   MCH 30.1 26.0 - 34.0 pg   MCHC 32.2 30.0 - 36.0 g/dL   RDW 14.9 11.5 - 15.5 %   Platelets 239 150 - 400 K/uL  Comprehensive metabolic panel     Status: Abnormal   Collection Time: 05/12/15  5:40 PM  Result Value Ref Range   Sodium 141 135 - 145 mmol/L   Potassium 3.2 (L) 3.5 - 5.1 mmol/L   Chloride 105 101 - 111 mmol/L   CO2 24 22 - 32 mmol/L   Glucose, Bld 112 (H) 65 - 99 mg/dL   BUN 16 6 - 20 mg/dL   Creatinine, Ser 0.97 0.44 - 1.00 mg/dL   Calcium 8.6 (L) 8.9 - 10.3 mg/dL   Total Protein 6.4 (L) 6.5 - 8.1 g/dL   Albumin 3.5 3.5 - 5.0 g/dL   AST 17 15 - 41 U/L   ALT 16 14 - 54 U/L   Alkaline Phosphatase 79 38 - 126 U/L   Total Bilirubin 0.4 0.3 - 1.2 mg/dL   GFR calc non Af Amer 57 (L) >60 mL/min   GFR calc Af Amer >60 >60 mL/min    Comment: (NOTE) The eGFR has been calculated using the CKD EPI equation. This calculation has not been validated in all clinical situations. eGFR's persistently <60 mL/min signify possible Chronic Kidney Disease.    Anion gap 12 5 - 15  Ethanol (ETOH)     Status: None   Collection Time: 05/12/15  5:40 PM  Result Value Ref Range   Alcohol, Ethyl (B) <5 <5 mg/dL    Comment:        LOWEST DETECTABLE LIMIT FOR SERUM ALCOHOL IS 5 mg/dL FOR MEDICAL PURPOSES ONLY   Salicylate level     Status: None   Collection  Time: 05/12/15  5:40 PM  Result Value Ref Range   Salicylate Lvl 40.0 2.8 - 30.0 mg/dL  Urine rapid drug screen (hosp performed)not at [facility]     Status: Abnormal   Collection Time: 05/12/15  6:30 PM  Result Value  Ref Range   Opiates NONE DETECTED NONE DETECTED   Cocaine NONE DETECTED NONE DETECTED   Benzodiazepines POSITIVE (A) NONE DETECTED   Amphetamines NONE DETECTED NONE DETECTED   Tetrahydrocannabinol NONE DETECTED NONE DETECTED   Barbiturates POSITIVE (A) NONE DETECTED    Comment:        DRUG SCREEN FOR MEDICAL PURPOSES ONLY.  IF CONFIRMATION IS NEEDED FOR ANY PURPOSE, NOTIFY LAB WITHIN 5 DAYS.        LOWEST DETECTABLE LIMITS FOR URINE DRUG SCREEN Drug Class       Cutoff (ng/mL) Amphetamine      1000 Barbiturate      200 Benzodiazepine   638 Tricyclics       466 Opiates          300 Cocaine          300 THC              50   Salicylate level     Status: None   Collection Time: 05/13/15  1:04 AM  Result Value Ref Range   Salicylate Lvl <5.9 2.8 - 30.0 mg/dL    Vitals: Blood pressure 134/67, pulse 84, temperature 98.7 F (37.1 C), temperature source Oral, resp. rate 18, height 5' 4"  (1.626 m), weight 90.719 kg (200 lb), SpO2 94 %.  Risk to Self: Suicidal Ideation: Yes-Currently Present Suicidal Intent: Yes-Currently Present Is patient at risk for suicide?: Yes Suicidal Plan?: Yes-Currently Present Specify Current Suicidal Plan: Overdose on meds Access to Means: Yes Specify Access to Suicidal Means: Pills What has been your use of drugs/alcohol within the last 12 months?: None Reported How many times?: 0 Other Self Harm Risks: None Reported Triggers for Past Attempts:  (NA) Intentional Self Injurious Behavior: None Risk to Others: Homicidal Ideation: No Thoughts of Harm to Others: No Current Homicidal Intent: No Current Homicidal Plan: No Access to Homicidal Means: No Identified Victim: NA History of harm to others?: No Assessment of Violence: None  Noted Violent Behavior Description: NA Does patient have access to weapons?: No Criminal Charges Pending?: No Does patient have a court date: No Prior Inpatient Therapy: Prior Inpatient Therapy: No Prior Therapy Dates: NA Prior Therapy Facilty/Provider(s): NA Reason for Treatment: NA Prior Outpatient Therapy: Prior Outpatient Therapy: Yes Prior Therapy Dates: [place]  Prior Therapy Facilty/Provider(s): Unable to remember  (Her doctor in [facility]) Reason for Treatment: Medication Management  Does patient have an ACCT team?: No Does patient have Intensive In-House Services?  : No Does patient have [facility] services? : No Does patient have P4[facility] services?: No  Current Facility-Administered Medications  Medication Dose Route Frequency Provider Last Rate Last Dose  . acetaminophen (TYLENOL) tablet 650 mg  650 mg Oral Q4H PRN Pattricia Boss, MD   650 mg at 05/13/15 0022  . amLODipine (NORVASC) tablet 5 mg  5 mg Oral Daily Pattricia Boss, MD   5 mg at 05/13/15 0817  . aspirin chewable tablet 81 mg  81 mg Oral Daily Pattricia Boss, MD   81 mg at 05/13/15 0818  . butalbital-acetaminophen-caffeine (FIORICET, ESGIC) 50-325-40 MG per tablet 1 tablet  1 tablet Oral Q4H PRN Pattricia Boss, MD   1 tablet at 05/13/15 0817  . diltiazem (CARDIZEM CD) 24 hr capsule 180 mg  180 mg Oral Daily Pattricia Boss, MD   180 mg at 05/13/15 0816  . FLUoxetine (PROZAC) capsule 40 mg  40 mg Oral Daily Pattricia Boss, MD   40 mg at 05/13/15 0817  . lisinopril (PRINIVIL,ZESTRIL)  tablet 5 mg  5 mg Oral Daily Pattricia Boss, MD   5 mg at 05/13/15 0818  . LORazepam (ATIVAN) tablet 1 mg  1 mg Oral Q8H PRN Pattricia Boss, MD   1 mg at 05/13/15 0021  . metoprolol (LOPRESSOR) tablet 50 mg  50 mg Oral BID Pattricia Boss, MD   50 mg at 05/13/15 0814  . ondansetron (ZOFRAN) tablet 4 mg  4 mg Oral Q8H PRN Pattricia Boss, MD      . ranolazine (RANEXA) 12 hr tablet 500 mg  500 mg Oral BID Pattricia Boss, MD   500 mg at 05/13/15 0814  . torsemide  (DEMADEX) tablet 20 mg  20 mg Oral Daily Pattricia Boss, MD   20 mg at 05/13/15 0814  . traZODone (DESYREL) tablet 50 mg  50 mg Oral QHS Pattricia Boss, MD       Current Outpatient Prescriptions  Medication Sig Dispense Refill  . acetaminophen (TYLENOL) 500 MG tablet Take 1,000 mg by mouth every 6 (six) hours as needed for headache.    . ALPRAZolam (XANAX) 0.5 MG tablet Take 1 tablet every night at bedtime 30 tablet 5  . amLODipine (NORVASC) 5 MG tablet Take 5 mg by mouth daily.    Marland Kitchen aspirin 81 MG tablet Take 81 mg by mouth daily.    Marland Kitchen atorvastatin (LIPITOR) 40 MG tablet Take 40 mg by mouth daily.    . butalbital-acetaminophen-caffeine (FIORICET WITH CODEINE) 50-325-40-30 MG per capsule Take 1 capsule by mouth every 4 (four) hours as needed for headache.    . diltiazem (CARDIZEM CD) 180 MG 24 hr capsule Take 180 mg by mouth daily.    Marland Kitchen FLUoxetine (PROZAC) 40 MG capsule Take 40 mg by mouth daily.    Marland Kitchen lisinopril (PRINIVIL,ZESTRIL) 5 MG tablet Take 5 mg by mouth daily.    . metoprolol (LOPRESSOR) 50 MG tablet Take 50 mg by mouth 2 (two) times daily.    Marland Kitchen omeprazole (PRILOSEC) 40 MG capsule Take 40 mg by mouth daily.    . ranolazine (RANEXA) 500 MG 12 hr tablet Take 500 mg by mouth 2 (two) times daily.    Marland Kitchen torsemide (DEMADEX) 20 MG tablet Take 20 mg by mouth daily.    . traMADol (ULTRAM) 50 MG tablet Take 50 mg by mouth every 6 (six) hours as needed for moderate pain.     . traZODone (DESYREL) 50 MG tablet Take 50 mg by mouth at bedtime.    . cephALEXin (KEFLEX) 500 MG capsule Take 1 capsule (500 mg total) by mouth 4 (four) times daily. (Patient not taking: Reported on 05/13/2015) 40 capsule 0  . HYDROcodone-acetaminophen (NORCO/VICODIN) 5-325 MG per tablet Take 2 tablets by mouth every 6 (six) hours as needed for pain. (Patient not taking: Reported on 05/13/2015) 10 tablet 0  . oxyCODONE (OXY IR/ROXICODONE) 5 MG immediate release tablet Take one tablet by mouth every 3 hours as needed for pain  (Patient not taking: Reported on 05/13/2015) 360 tablet 0  . oxyCODONE-acetaminophen (ROXICET) 5-325 MG per tablet Take one tablet by mouth every four hours as needed for pain. (Patient not taking: Reported on 05/13/2015) 180 tablet 0    Musculoskeletal: Strength & Muscle Tone: within normal limits Gait & Station: normal Patient leans: N/A  Psychiatric Specialty Exam: Physical Exam  Review of Systems  Psychiatric/Behavioral: Positive for depression and suicidal ideas. The patient is nervous/anxious.   All other systems reviewed and are negative.   Blood pressure 134/67, pulse 84, temperature 98.7  F (37.1 C), temperature source Oral, resp. rate 18, height 5' 4"  (1.626 m), weight 90.719 kg (200 lb), SpO2 94 %.Body mass index is 34.31 kg/(m^2).  General Appearance: Casual and Fairly Groomed  Engineer, water::  Good  Speech:  Clear and Coherent and Normal Rate  Volume:  Normal  Mood:  Depressed  Affect:  Depressed  Thought Process:  Coherent and Goal Directed  Orientation:  Full (Time, Place, and Person)  Thought Content:  Rumination  Suicidal Thoughts:  Yes.  with intent/plan  Homicidal Thoughts:  No  Memory:  Immediate;   Fair Recent;   Fair Remote;   Fair  Judgement:  Fair  Insight:  Lacking  Psychomotor Activity:  Decreased  Concentration:  Fair  Recall:  [employer] of Knowledge:Fair  Language: Good  Akathisia:  No  Handed:    AIMS (if indicated):     Assets:  Communication Skills Desire for Improvement Resilience Social Support  ADL's:  Intact  Cognition: WNL  Sleep:      Medical Decision Making: Review of Psycho-Social Stressors (1), Review or order clinical lab tests (1), Established Problem, Worsening (2), Review of Medication Regimen & Side Effects (2) and Review of New Medication or Change in Dosage (2)  Treatment Plan Summary: Suicidal ideation, persistent, warrants inpatient admission  Disposition:  -Pt accepted to [facility]; continue with geropsychiatry  placement there for safety and stabilization.   Benjamine Mola, FNP-BC 05/13/2015 10:11 AM Patient seen face-to-face for psychiatric evaluation, chart reviewed and case discussed with the physician extender and developed treatment plan. Reviewed the information documented and agree with the treatment plan. Corena Pilgrim, MD

## 2015-05-16 DIAGNOSIS — M797 Fibromyalgia: Secondary | ICD-10-CM | POA: Insufficient documentation

## 2015-05-16 DIAGNOSIS — E559 Vitamin D deficiency, unspecified: Secondary | ICD-10-CM | POA: Insufficient documentation

## 2015-05-16 DIAGNOSIS — E538 Deficiency of other specified B group vitamins: Secondary | ICD-10-CM | POA: Insufficient documentation

## 2015-05-16 DIAGNOSIS — I2510 Atherosclerotic heart disease of native coronary artery without angina pectoris: Secondary | ICD-10-CM | POA: Insufficient documentation

## 2015-05-24 ENCOUNTER — Telehealth: Payer: Self-pay | Admitting: Family Medicine

## 2015-05-24 NOTE — Telephone Encounter (Signed)
Pt called in stating that she was in Hospital and dx with depression. She was given a new med and stated she takes 15 pills per day. She said that yesterday and today her eyes were so blurry and she was dizzy and could not walk one foot in front of the other. She said existing PCP could not see her and referred her to the ER. She states that the ER is who gave her more meds and she began crying. Advised pt that I would transfer her to nurse to evaluate her. New pt appt scheduled with Dr. Etter Sjogren 06/17/15. Transferred call to [facility].

## 2015-05-24 NOTE — Telephone Encounter (Signed)
[facility] - Client TELEPHONE ADVICE RECORD   [facility]     Patient Name: Stephanie Mckenzie Initial Comment Caller states on new meds, eyes blurry, feels like she's going to fall, couldn't put one foot in front of the other, had the shakes, took without food.   DOB: Feb 13, 1941      Nurse Assessment  Nurse: Malva Cogan, RN, Juliann Pulse Date/Time (Eastern Time): 05/24/2015 5:32:19 PM  Confirm and document reason for call. If symptomatic, describe symptoms. ---Caller states that she has started on 2 new meds, Neurontin & Viibryd. Was discharged from hospital after being seen in ED for "massive headache, migraine, & depression". Caller states that she was kept on psychiatric unit for depression. Caller states that she has been having blurring of her vision & has been feeling like she is going to fall & "can't put one foot in front of the other". Caller states that her main symptom is that of feeling confused, is oriented but is forgetting where she is placing items. Also states that she took her meds on empty stomach as she didn't know that she needed to take meds with food. Has spoken with pharmacist who advised her to quit taking Neurontin & HA med.  Has the patient traveled out of the country within the last 30 days? ---No  Does the patient require triage? ---Yes  Related visit to physician within the last 2 weeks? ---No  Does the PT have any chronic conditions? (i.e. diabetes, asthma, etc.) ---Yes  List chronic conditions. ---migraines, depression, fibromyalgia, HTN, acid reflux    Guidelines     Guideline Title Affirmed Question Affirmed Notes   Confusion - Delirium Headache or vomiting    Final Disposition User   Go to ED Now Malva Cogan, RN, Juliann Pulse   Comments   Caller does not sound confused now but sounds very anxious, after triaged & advised that she needed to be seen in ED, caller advised triager that reason she was calling was because she wanted to try to get a  sooner appt than what she has already scheduled. Caller advised that tonight she needs to be examined & that she can call office tomorrow after evaluated to see if she can get a sooner appt.

## 2015-05-25 NOTE — Telephone Encounter (Signed)
Patient returned call and stated that she is feeling shaky, dizzy, and depressed.  She declined to go to ED because she has recently been worked up there and she did not feel any better.  She has an appointment scheduled with Dr. Etter Sjogren 06/17/15, but would like to be seen sooner.  Patient states that she is planning to have her daughter drive her to appointment due to the dizziness.  Notified patient that we could work her in tomorrow 05/26/15 (per Maudie Mercury 4:00), and she stated she will check with her daughter and call back.    Notified patient that if any symptoms become severe or she has any suicidal ideation to go to [facility] and she verbalized understanding.    Awaiting return call from patient.

## 2015-05-25 NOTE — Telephone Encounter (Signed)
Patient returned call and was only able to make it at 11:00 tomorrow due to her daughter's schedule.  Patient scheduled at 11:00.

## 2015-05-25 NOTE — Telephone Encounter (Signed)
Left a message for call back.  

## 2015-05-26 ENCOUNTER — Encounter: Payer: Self-pay | Admitting: Family Medicine

## 2015-05-26 ENCOUNTER — Ambulatory Visit (INDEPENDENT_AMBULATORY_CARE_PROVIDER_SITE_OTHER): Payer: PPO | Admitting: Family Medicine

## 2015-05-26 VITALS — BP 148/78 | HR 99 | Temp 97.9°F | Wt 214.0 lb

## 2015-05-26 DIAGNOSIS — E213 Hyperparathyroidism, unspecified: Secondary | ICD-10-CM

## 2015-05-26 DIAGNOSIS — E785 Hyperlipidemia, unspecified: Secondary | ICD-10-CM | POA: Diagnosis not present

## 2015-05-26 DIAGNOSIS — I10 Essential (primary) hypertension: Secondary | ICD-10-CM

## 2015-05-26 DIAGNOSIS — G43919 Migraine, unspecified, intractable, without status migrainosus: Secondary | ICD-10-CM | POA: Diagnosis not present

## 2015-05-26 DIAGNOSIS — I2510 Atherosclerotic heart disease of native coronary artery without angina pectoris: Secondary | ICD-10-CM

## 2015-05-26 DIAGNOSIS — R635 Abnormal weight gain: Secondary | ICD-10-CM | POA: Diagnosis not present

## 2015-05-26 DIAGNOSIS — K219 Gastro-esophageal reflux disease without esophagitis: Secondary | ICD-10-CM

## 2015-05-26 DIAGNOSIS — F332 Major depressive disorder, recurrent severe without psychotic features: Secondary | ICD-10-CM | POA: Diagnosis not present

## 2015-05-26 DIAGNOSIS — Z8639 Personal history of other endocrine, nutritional and metabolic disease: Secondary | ICD-10-CM

## 2015-05-26 LAB — CBC WITH DIFFERENTIAL/PLATELET
BASOS PCT: 0.4 % (ref 0.0–3.0)
Basophils Absolute: 0 10*3/uL (ref 0.0–0.1)
EOS ABS: 0.1 10*3/uL (ref 0.0–0.7)
Eosinophils Relative: 1.3 % (ref 0.0–5.0)
HCT: 36.2 % (ref 36.0–46.0)
Hemoglobin: 11.9 g/dL — ABNORMAL LOW (ref 12.0–15.0)
Lymphocytes Relative: 21.4 % (ref 12.0–46.0)
Lymphs Abs: 1.4 10*3/uL (ref 0.7–4.0)
MCHC: 33 g/dL (ref 30.0–36.0)
MCV: 91.7 fl (ref 78.0–100.0)
Monocytes Absolute: 0.5 10*3/uL (ref 0.1–1.0)
Monocytes Relative: 7.2 % (ref 3.0–12.0)
NEUTROS PCT: 69.7 % (ref 43.0–77.0)
Neutro Abs: 4.7 10*3/uL (ref 1.4–7.7)
PLATELETS: 263 10*3/uL (ref 150.0–400.0)
RBC: 3.95 Mil/uL (ref 3.87–5.11)
RDW: 16.2 % — ABNORMAL HIGH (ref 11.5–15.5)
WBC: 6.7 10*3/uL (ref 4.0–10.5)

## 2015-05-26 LAB — LIPID PANEL
CHOL/HDL RATIO: 4
CHOLESTEROL: 165 mg/dL (ref 0–200)
HDL: 43.8 mg/dL (ref >39.00)
LDL Cholesterol: 83 mg/dL (ref 0–99)
NONHDL: 121.2
Triglycerides: 192 mg/dL — ABNORMAL HIGH (ref 0.0–149.0)
VLDL: 38.4 mg/dL (ref 0.0–40.0)

## 2015-05-26 LAB — BASIC METABOLIC PANEL
BUN: 28 mg/dL — ABNORMAL HIGH (ref 6–23)
CHLORIDE: 110 meq/L (ref 96–112)
CO2: 26 mEq/L (ref 19–32)
Calcium: 9.3 mg/dL (ref 8.4–10.5)
Creatinine, Ser: 1.14 mg/dL (ref 0.40–1.20)
GFR: 49.81 mL/min — AB (ref >60.00)
GLUCOSE: 85 mg/dL (ref 70–99)
Potassium: 4.1 mEq/L (ref 3.5–5.1)
Sodium: 143 mEq/L (ref 135–145)

## 2015-05-26 LAB — HEPATIC FUNCTION PANEL
ALBUMIN: 3.7 g/dL (ref 3.5–5.2)
ALT: 15 U/L (ref 0–35)
AST: 14 U/L (ref 0–37)
Alkaline Phosphatase: 69 U/L (ref 39–117)
BILIRUBIN DIRECT: 0 mg/dL (ref 0.0–0.3)
Total Bilirubin: 0.2 mg/dL (ref 0.2–1.2)
Total Protein: 6.6 g/dL (ref 6.0–8.3)

## 2015-05-26 LAB — TSH: TSH: 1.94 u[IU]/mL (ref 0.35–4.50)

## 2015-05-26 LAB — POCT URINALYSIS DIPSTICK
Bilirubin, UA: NEGATIVE
Blood, UA: NEGATIVE
Glucose, UA: NEGATIVE
KETONES UA: NEGATIVE
Leukocytes, UA: NEGATIVE
Nitrite, UA: NEGATIVE
PROTEIN UA: NEGATIVE
SPEC GRAV UA: 1.025
Urobilinogen, UA: 0.2
pH, UA: 6

## 2015-05-26 MED ORDER — TORSEMIDE 20 MG PO TABS: ORAL_TABLET | ORAL | Status: DC

## 2015-05-26 MED ORDER — PANTOPRAZOLE SODIUM 40 MG PO TBEC: 40.0000 mg | DELAYED_RELEASE_TABLET | Freq: Every day | ORAL | Status: DC

## 2015-05-26 NOTE — Progress Notes (Signed)
Patient ID: Stephanie Mckenzie, female    DOB: 05/13/1941  Age: 74 y.o. MRN: [id number]    Subjective:  Subjective HPI [name] presents for f/u hospital for depression.  She was just released from [facility] Saturday and has an appointment with psych today at 3pm. Pt sees cardiology on July 15th.  She od on neurontin and got confused and dizzy.  She states it was accidental and she is not suicidal.   Pt is also c/o edema in her low ext.   No sob, no cp.  She was having jaw pain with exertion but has been worked up by cardiology for this.  She will f/u with her dentist/ periodontist as well.  Review of Systems  Constitutional: Negative for diaphoresis, appetite change, fatigue and unexpected weight change.  Eyes: Negative for pain, redness and visual disturbance.  Respiratory: Negative for cough, chest tightness, shortness of breath and wheezing.   Cardiovascular: Negative for chest pain, palpitations and leg swelling.  Endocrine: Negative for cold intolerance, heat intolerance, polydipsia, polyphagia and polyuria.  Genitourinary: Negative for dysuria, frequency and difficulty urinating.  Neurological: Negative for dizziness, light-headedness, numbness and headaches.  Psychiatric/Behavioral: Positive for dysphoric mood. Negative for hallucinations. The patient is not nervous/anxious and is not hyperactive.     History Past Medical History  Diagnosis Date  . Hypertension   . Migraine   . Depression     She has past surgical history that includes Cesarean section; Appendectomy; Parathyroidectomy; Laminectomy; Coronary angioplasty; and Abdominal hysterectomy.   Her family history is not on file.She reports that she has quit smoking. She does not have any smokeless tobacco history on file. She reports that she drinks alcohol. She reports that she does not use illicit drugs.  Current Outpatient Prescriptions on File Prior to Visit  Medication Sig Dispense Refill  . ALPRAZolam  (XANAX) 0.5 MG tablet Take 1 tablet every night at bedtime (Patient taking differently: Take 0.5 mg by mouth 2 (two) times daily as needed. Take 1 tablet every night at bedtime) 30 tablet 5  . aspirin 81 MG tablet Take 1 tablet (81 mg total) by mouth daily. 30 tablet 0  . atorvastatin (LIPITOR) 40 MG tablet Take 40 mg by mouth daily.    . butalbital-acetaminophen-caffeine (FIORICET WITH CODEINE) 50-325-40-30 MG per capsule Take 1 capsule by mouth every 4 (four) hours as needed for headache.    . diltiazem (CARDIZEM CD) 180 MG 24 hr capsule Take 1 capsule (180 mg total) by mouth daily.    . metoprolol (LOPRESSOR) 50 MG tablet Take 1 tablet (50 mg total) by mouth 2 (two) times daily. (Patient taking differently: Take 50 mg by mouth daily. )    . ranolazine (RANEXA) 500 MG 12 hr tablet Take 1 tablet (500 mg total) by mouth 2 (two) times daily.    . traMADol (ULTRAM) 50 MG tablet Take 50 mg by mouth 3 (three) times daily as needed for moderate pain.     . traZODone (DESYREL) 50 MG tablet Take 1 tablet (50 mg total) by mouth at bedtime.     No current facility-administered medications on file prior to visit.     Objective:  Objective Physical Exam  Constitutional: She is oriented to person, place, and time. She appears well-developed and well-nourished.  HENT:  Head: Normocephalic and atraumatic.  Eyes: Conjunctivae and EOM are normal.  Neck: Normal range of motion. Neck supple. No JVD present. Carotid bruit is not present. No thyromegaly present.  Cardiovascular: Normal rate, regular rhythm and normal heart sounds.   No murmur heard. Pulmonary/Chest: Effort normal and breath sounds normal. No respiratory distress. She has no wheezes. She has no rales. She exhibits no tenderness.  Musculoskeletal: She exhibits no edema.  Neurological: She is alert and oriented to person, place, and time.  Psychiatric: Her behavior is normal. Her mood appears anxious. She exhibits a depressed mood. She expresses  no suicidal plans and no homicidal plans.   BP 148/78 mmHg  Pulse 99  Temp(Src) 97.9 F (36.6 C) (Oral)  Wt 214 lb (97.07 kg)  SpO2 97% Wt Readings from Last 3 Encounters:  05/26/15 214 lb (97.07 kg)  05/12/15 200 lb (90.719 kg)  05/04/15 200 lb (90.719 kg)     Lab Results  Component Value Date   WBC 6.3 05/12/2015   HGB 12.3 05/12/2015   HCT 38.2 05/12/2015   PLT 239 05/12/2015   GLUCOSE 112* 05/12/2015   ALT 16 05/12/2015   AST 17 05/12/2015   NA 141 05/12/2015   K 3.2* 05/12/2015   CL 105 05/12/2015   CREATININE 0.97 05/12/2015   BUN 16 05/12/2015   CO2 24 05/12/2015   INR 0.88 12/27/2009    Dg Chest 2 View  05/12/2015   CLINICAL DATA:  Medical clearance. Admission chest radiograph. Initial encounter.  EXAM: CHEST  2 VIEW  COMPARISON:  Chest radiograph performed 03/14/2015  FINDINGS: The lungs are well-aerated. Pulmonary vascularity is at the upper limits of normal. There is no evidence of focal opacification, pleural effusion or pneumothorax.  The heart is normal in size; the mediastinal contour is within normal limits. No acute osseous abnormalities are seen.  IMPRESSION: No acute cardiopulmonary process seen.   Electronically Signed   By: Garald Balding M.D.   On: 05/12/2015 23:40     Assessment & Plan:  Plan I have discontinued Ms. Games's lisinopril, cephALEXin, HYDROcodone-acetaminophen, oxyCODONE-acetaminophen, oxyCODONE, omeprazole, acetaminophen, amLODipine, and FLUoxetine. I have also changed her torsemide. Additionally, I am having her start on pantoprazole. Lastly, I am having her maintain her ALPRAZolam, butalbital-acetaminophen-caffeine, traMADol, atorvastatin, aspirin, diltiazem, metoprolol, ranolazine, traZODone, gabapentin, lidocaine, mirtazapine, Vilazodone HCl, vitamin B-12, metoprolol, and chlorpheniramine.  Meds ordered this encounter  Medications  . gabapentin (NEURONTIN) 300 MG capsule    Sig: Take 300 mg by mouth. At breakfast and then at lunch    . lidocaine (LIDODERM) 5 %    Sig: Place 3 patches onto the skin every 12 (twelve) hours as needed.  . mirtazapine (REMERON) 45 MG tablet    Sig: Take 45 mg by mouth at bedtime as needed.  . Vilazodone HCl 20 MG TABS    Sig: Take 20 mg by mouth daily.  . vitamin B-12 (CYANOCOBALAMIN) 1000 MCG tablet    Sig: Take 1,000 mcg by mouth daily.  . metoprolol (LOPRESSOR) 50 MG tablet    Sig: Take 50 mg by mouth 2 (two) times daily.  . chlorpheniramine (CHLOR-TRIMETON) 4 MG tablet    Sig: Take 4 mg by mouth 2 (two) times daily as needed for allergies.  Marland Kitchen torsemide (DEMADEX) 20 MG tablet    Sig: 2 po qd    Dispense:  60 tablet    Refill:  2  . pantoprazole (PROTONIX) 40 MG tablet    Sig: Take 1 tablet (40 mg total) by mouth daily.    Dispense:  90 tablet    Refill:  3    Problem List Items Addressed This Visit    MDD (major  depressive disorder) - Primary    con't meds per hosp And keep psych appointment today      Relevant Medications   mirtazapine (REMERON) 45 MG tablet   Vilazodone HCl 20 MG TABS   HPTH (hyperparathyroidism)    Check labs      GERD (gastroesophageal reflux disease)    Pt states protonix worked better in [place] and is asking to switch to protonix  D/c omeprazole and take protonix daily      Relevant Medications   pantoprazole (PROTONIX) 40 MG tablet   Essential hypertension, benign    Cont diltiazem and metoprolol toresimide inc to 2 a day F/u in a few weeks if not seen by cardiology      Relevant Medications   metoprolol (LOPRESSOR) 50 MG tablet   torsemide (DEMADEX) 20 MG tablet    Other Visit Diagnoses    Intractable migraine without status migrainosus, unspecified migraine type        Relevant Medications    gabapentin (NEURONTIN) 300 MG capsule    mirtazapine (REMERON) 45 MG tablet    Vilazodone HCl 20 MG TABS    metoprolol (LOPRESSOR) 50 MG tablet    torsemide (DEMADEX) 20 MG tablet    Other Relevant Orders    Ambulatory referral to Neurology     Coronary artery disease involving native coronary artery of native heart without angina pectoris        Relevant Medications    metoprolol (LOPRESSOR) 50 MG tablet    torsemide (DEMADEX) 20 MG tablet    Other Relevant Orders    Basic metabolic panel    CBC with Differential/Platelet    POCT urinalysis dipstick    Hyperlipidemia        Relevant Medications    metoprolol (LOPRESSOR) 50 MG tablet    torsemide (DEMADEX) 20 MG tablet    Other Relevant Orders    Hepatic function panel    Lipid panel    POCT urinalysis dipstick    Essential hypertension        Relevant Medications    metoprolol (LOPRESSOR) 50 MG tablet    torsemide (DEMADEX) 20 MG tablet    Other Relevant Orders    Basic metabolic panel    CBC with Differential/Platelet    POCT urinalysis dipstick    Weight gain        Relevant Orders    TSH    History of hyperparathyroidism        Relevant Orders    PTH, Intact and Calcium       Follow-up: Return in about 4 weeks (around 06/23/2015), or if symptoms worsen or fail to improve.  Garnet Koyanagi, DO

## 2015-05-26 NOTE — Assessment & Plan Note (Signed)
Check labs 

## 2015-05-26 NOTE — Assessment & Plan Note (Signed)
con't meds per hosp And keep psych appointment today

## 2015-05-26 NOTE — Assessment & Plan Note (Signed)
Cont diltiazem and metoprolol toresimide inc to 2 a day F/u in a few weeks if not seen by cardiology

## 2015-05-26 NOTE — Progress Notes (Signed)
Pre visit review using our clinic review tool, if applicable. No additional management support is needed unless otherwise documented below in the visit note. 

## 2015-05-26 NOTE — Assessment & Plan Note (Signed)
Pt states protonix worked better in [place] and is asking to switch to protonix  D/c omeprazole and take protonix daily

## 2015-05-26 NOTE — Patient Instructions (Signed)

## 2015-05-31 LAB — PTH, INTACT AND CALCIUM
Calcium: 9.1 mg/dL (ref 8.4–10.5)
PTH: 208 pg/mL — ABNORMAL HIGH (ref 14–64)

## 2015-05-31 LAB — IGG

## 2015-05-31 NOTE — Telephone Encounter (Addendum)
Pt would like to speak with you regarding appointment. Please call [phone]

## 2015-06-01 NOTE — Telephone Encounter (Signed)
Attempted to call patient with answer.  Number did not connect to voicemail, so could not leave message.  Will attempt call back later today.

## 2015-06-02 NOTE — Telephone Encounter (Signed)
Attempted to return patient call, but number did not connect to voicemail so unable to leave message.

## 2015-06-03 ENCOUNTER — Other Ambulatory Visit: Payer: Self-pay

## 2015-06-03 DIAGNOSIS — E213 Hyperparathyroidism, unspecified: Secondary | ICD-10-CM

## 2015-06-06 NOTE — Telephone Encounter (Signed)
Notified Ronaldo Miyamoto, CMA.

## 2015-06-06 NOTE — Telephone Encounter (Signed)
Pt called in. Unable to get answer when I tried to transfer. Pt states that she is no longer feeling dizzy or shaky. She said that she was informed she OD on gabapentin. Pt scheduled appt for 06/07/15 1:00pm. Please advise if this needs changed/cancelled.

## 2015-06-07 ENCOUNTER — Ambulatory Visit (INDEPENDENT_AMBULATORY_CARE_PROVIDER_SITE_OTHER): Payer: PPO | Admitting: Family Medicine

## 2015-06-07 ENCOUNTER — Encounter: Payer: Self-pay | Admitting: Family Medicine

## 2015-06-07 VITALS — BP 140/88 | HR 79 | Temp 98.5°F

## 2015-06-07 DIAGNOSIS — E213 Hyperparathyroidism, unspecified: Secondary | ICD-10-CM | POA: Diagnosis not present

## 2015-06-07 DIAGNOSIS — F4489 Other dissociative and conversion disorders: Secondary | ICD-10-CM

## 2015-06-07 DIAGNOSIS — R410 Disorientation, unspecified: Secondary | ICD-10-CM | POA: Diagnosis not present

## 2015-06-07 NOTE — Assessment & Plan Note (Signed)
D/w with daughter--- we also call psych Her appointment is with psych today at 36 -- they left to go to psych I advised the daughter to take her to [facility] ER if her psych did not admit her (he is at [facility])---- agreed with daughter that she has deteriorated since last ov Call prn

## 2015-06-07 NOTE — Progress Notes (Signed)
Pre visit review using our clinic review tool, if applicable. No additional management support is needed unless otherwise documented below in the visit note. 

## 2015-06-07 NOTE — Progress Notes (Signed)
Patient ID: SHAKERIA ROBINETTE, female    DOB: 1941/03/23  Age: 74 y.o. MRN: [id number]    Subjective:  Subjective HPI SHARMAIN LASTRA presents for f/u-- pt was confused about her appointment.  She actually did not have one today.  She has one with her psychiatrist---we will call to see when it is.  Her daughter is with her today and is very concerned.  The patient comes in today with her pj top on inside out and backwards.  Her daughter states she lives in awful conditions and things have worsened since she was d/c from [place].  She forgets things and is confusing appointments.   No other complaints.   Review of Systems  Constitutional: Negative for diaphoresis, appetite change, fatigue and unexpected weight change.  Eyes: Negative for pain, redness and visual disturbance.  Respiratory: Negative for cough, chest tightness, shortness of breath and wheezing.   Cardiovascular: Negative for chest pain, palpitations and leg swelling.  Endocrine: Negative for cold intolerance, heat intolerance, polydipsia, polyphagia and polyuria.  Genitourinary: Negative for dysuria, frequency and difficulty urinating.  Neurological: Negative for dizziness, light-headedness, numbness and headaches.  Psychiatric/Behavioral: Positive for confusion. Negative for behavioral problems, dysphoric mood and decreased concentration. The patient is not nervous/anxious.     History Past Medical History  Diagnosis Date  . Hypertension   . Migraine   . Depression     She has past surgical history that includes Cesarean section; Appendectomy; Parathyroidectomy; Laminectomy; Coronary angioplasty; and Abdominal hysterectomy.   Her family history is not on file.She reports that she has quit smoking. She does not have any smokeless tobacco history on file. She reports that she drinks alcohol. She reports that she does not use illicit drugs.  Current Outpatient Prescriptions on File Prior to Visit  Medication Sig Dispense  Refill  . ALPRAZolam (XANAX) 0.5 MG tablet Take 1 tablet every night at bedtime (Patient taking differently: Take 0.5 mg by mouth 2 (two) times daily as needed. Take 1 tablet every night at bedtime) 30 tablet 5  . aspirin 81 MG tablet Take 1 tablet (81 mg total) by mouth daily. 30 tablet 0  . atorvastatin (LIPITOR) 40 MG tablet Take 40 mg by mouth daily.    . butalbital-acetaminophen-caffeine (FIORICET WITH CODEINE) 50-325-40-30 MG per capsule Take 1 capsule by mouth every 4 (four) hours as needed for headache.    . chlorpheniramine (CHLOR-TRIMETON) 4 MG tablet Take 4 mg by mouth 2 (two) times daily as needed for allergies.    Marland Kitchen diltiazem (CARDIZEM CD) 180 MG 24 hr capsule Take 1 capsule (180 mg total) by mouth daily.    Marland Kitchen gabapentin (NEURONTIN) 300 MG capsule Take 300 mg by mouth. At breakfast and then at lunch    . lidocaine (LIDODERM) 5 % Place 3 patches onto the skin every 12 (twelve) hours as needed.    . metoprolol (LOPRESSOR) 50 MG tablet Take 1 tablet (50 mg total) by mouth 2 (two) times daily. (Patient taking differently: Take 50 mg by mouth daily. )    . metoprolol (LOPRESSOR) 50 MG tablet Take 50 mg by mouth 2 (two) times daily.    . mirtazapine (REMERON) 45 MG tablet Take 45 mg by mouth at bedtime as needed.    . pantoprazole (PROTONIX) 40 MG tablet Take 1 tablet (40 mg total) by mouth daily. 90 tablet 3  . ranolazine (RANEXA) 500 MG 12 hr tablet Take 1 tablet (500 mg total) by mouth 2 (two) times daily.    Marland Kitchen  torsemide (DEMADEX) 20 MG tablet 2 po qd 60 tablet 2  . traMADol (ULTRAM) 50 MG tablet Take 50 mg by mouth 3 (three) times daily as needed for moderate pain.     . traZODone (DESYREL) 50 MG tablet Take 1 tablet (50 mg total) by mouth at bedtime.    . Vilazodone HCl 20 MG TABS Take 20 mg by mouth daily.    . vitamin B-12 (CYANOCOBALAMIN) 1000 MCG tablet Take 1,000 mcg by mouth daily.     No current facility-administered medications on file prior to visit.     Objective:    Objective Physical Exam  Constitutional: She is oriented to person, place, and time. She appears well-developed and well-nourished.  HENT:  Head: Normocephalic and atraumatic.  Eyes: Conjunctivae and EOM are normal.  Neck: Normal range of motion. Neck supple. No JVD present. Carotid bruit is not present. No thyromegaly present.  Cardiovascular: Normal rate, regular rhythm and normal heart sounds.   No murmur heard. Pulmonary/Chest: Effort normal and breath sounds normal. No respiratory distress. She has no wheezes. She has no rales. She exhibits no tenderness.  Musculoskeletal: She exhibits no edema.  Neurological: She is alert and oriented to person, place, and time.  Psychiatric: Her affect is not angry and not inappropriate. She is not agitated, not aggressive, not hyperactive, not slowed, not withdrawn, not actively hallucinating and not combative. Thought content is not paranoid and not delusional. Cognition and memory are impaired. She exhibits a depressed mood. She expresses no homicidal and no suicidal ideation. She is attentive.   BP 140/88 mmHg  Pulse 79  Temp(Src) 98.5 F (36.9 C) (Oral)  Wt   SpO2 98% Wt Readings from Last 3 Encounters:  05/26/15 214 lb (97.07 kg)  05/12/15 200 lb (90.719 kg)  05/04/15 200 lb (90.719 kg)     Lab Results  Component Value Date   WBC 6.7 05/26/2015   HGB 11.9* 05/26/2015   HCT 36.2 05/26/2015   PLT 263.0 05/26/2015   GLUCOSE 85 05/26/2015   CHOL 165 05/26/2015   TRIG 192.0* 05/26/2015   HDL 43.80 05/26/2015   LDLCALC 83 05/26/2015   ALT 15 05/26/2015   AST 14 05/26/2015   NA 143 05/26/2015   K 4.1 05/26/2015   CL 110 05/26/2015   CREATININE 1.14 05/26/2015   BUN 28* 05/26/2015   CO2 26 05/26/2015   TSH 1.94 05/26/2015   INR 0.88 12/27/2009    Dg Chest 2 View  05/12/2015   CLINICAL DATA:  Medical clearance. Admission chest radiograph. Initial encounter.  EXAM: CHEST  2 VIEW  COMPARISON:  Chest radiograph performed  03/14/2015  FINDINGS: The lungs are well-aerated. Pulmonary vascularity is at the upper limits of normal. There is no evidence of focal opacification, pleural effusion or pneumothorax.  The heart is normal in size; the mediastinal contour is within normal limits. No acute osseous abnormalities are seen.  IMPRESSION: No acute cardiopulmonary process seen.   Electronically Signed   By: Garald Balding M.D.   On: 05/12/2015 23:40   p  Assessment & Plan:  Plan I am having Ms. River maintain her ALPRAZolam, butalbital-acetaminophen-caffeine, traMADol, atorvastatin, aspirin, diltiazem, metoprolol, ranolazine, traZODone, gabapentin, lidocaine, mirtazapine, Vilazodone HCl, vitamin B-12, metoprolol, chlorpheniramine, torsemide, and pantoprazole.  No orders of the defined types were placed in this encounter.    Problem List Items Addressed This Visit    Mental confusion    D/w with daughter--- we also call psych Her appointment is with psych today  at 79 -- they left to go to psych I advised the daughter to take her to [facility] ER if her psych did not admit her (he is at [facility])---- agreed with daughter that she has deteriorated since last ov Call prn       Other Visit Diagnoses    Hyperparathyroidism    -  Primary    Relevant Orders    Ambulatory referral to Endocrinology    Confusion state        Relevant Orders    Ambulatory referral to Neurology     pt was here 50 min with > 50 % time face to face with pt and daughter discussing her situation  Follow-up: No Follow-up on file.  Garnet Koyanagi, DO

## 2015-06-07 NOTE — Patient Instructions (Signed)
Confusion Confusion is the inability to think with your usual speed or clarity. Confusion may come on quickly or slowly over time. How quickly the confusion comes on depends on the cause. Confusion can be due to any number of causes. CAUSES   Concussion, head injury, or head trauma.  Seizures.  Stroke.  Fever.  Brain tumor.  Age related decreased brain function (dementia).  Heightened emotional states like rage or terror.  Mental illness in which the person loses the ability to determine what is real and what is not (hallucinations).  Infections such as a urinary tract infection (UTI).  Toxic effects from alcohol, drugs, or prescription medicines.  Dehydration and an imbalance of salts in the body (electrolytes).  Lack of sleep.  Low blood sugar (diabetes).  Low levels of oxygen from conditions such as chronic lung disorders.  Drug interactions or other medicine side effects.  Nutritional deficiencies, especially niacin, thiamine, vitamin C, or vitamin B.  Sudden drop in body temperature (hypothermia).  Change in routine, such as when traveling or hospitalized. SIGNS AND SYMPTOMS  People often describe their thinking as cloudy or unclear when they are confused. Confusion can also include feeling disoriented. That means you are unaware of where or who you are. You may also not know what the date or time is. If confused, you may also have difficulty paying attention, remembering, and making decisions. Some people also act aggressively when they are confused.  DIAGNOSIS  The medical evaluation of confusion may include:  Blood and urine tests.  X-rays.  Brain and nervous system tests.  Analyzing your brain waves (electroencephalogram or EEG).  Magnetic resonance imaging (MRI) of your head.  Computed tomography (CT) scan of your head.  Mental status tests in which your health care provider may ask many questions. Some of these questions may seem silly or strange,  but they are a very important test to help diagnose and treat confusion. TREATMENT  An admission to the hospital may not be needed, but a person with confusion should not be left alone. Stay with a family member or friend until the confusion clears. Avoid alcohol, pain relievers, or sedative drugs until you have fully recovered. Do not drive until directed by your health care provider. HOME CARE INSTRUCTIONS  What family and friends can do:  To find out if someone is confused, ask the person to state his or her name, age, and the date. If the person is unsure or answers incorrectly, he or she is confused.  Always introduce yourself, no matter how well the person knows you.  Often remind the person of his or her location.  Place a calendar and clock near the confused person.  Help the person with his or her medicines. You may want to use a pill box, an alarm as a reminder, or give the person each dose as prescribed.  Talk about current events and plans for the day.  Try to keep the environment calm, quiet, and peaceful.  Make sure the person keeps follow-up visits with his or her health care provider. PREVENTION  Ways to prevent confusion:  Avoid alcohol.  Eat a balanced diet.  Get enough sleep.  Take medicine only as directed by your health care provider.  Do not become isolated. Spend time with other people and make plans for your days.  Keep careful watch on your blood sugar levels if you are diabetic. SEEK IMMEDIATE MEDICAL CARE IF:   You develop severe headaches, repeated vomiting, seizures, blackouts, or   slurred speech.  There is increasing confusion, weakness, numbness, restlessness, or personality changes.  You develop a loss of balance, have marked dizziness, feel uncoordinated, or fall.  You have delusions, hallucinations, or develop severe anxiety.  Your family members think you need to be rechecked. Document Released: 12/20/2004 Document Revised: 03/29/2014  Document Reviewed: 12/18/2013 [employer] Patient Information 2015 [employer]. This information is not intended to replace advice given to you by your health care provider. Make sure you discuss any questions you have with your health care provider.  

## 2015-06-16 ENCOUNTER — Telehealth: Payer: Self-pay | Admitting: *Deleted

## 2015-06-16 NOTE — Telephone Encounter (Signed)
Unable to reach patient at time of Pre-Visit Call.  Left message for patient to return call when available.    

## 2015-06-17 ENCOUNTER — Ambulatory Visit (INDEPENDENT_AMBULATORY_CARE_PROVIDER_SITE_OTHER): Payer: PPO | Admitting: Family Medicine

## 2015-06-17 ENCOUNTER — Encounter: Payer: Self-pay | Admitting: Family Medicine

## 2015-06-17 VITALS — BP 133/82 | HR 87 | Temp 98.3°F | Resp 18 | Ht 64.0 in | Wt 216.8 lb

## 2015-06-17 DIAGNOSIS — K219 Gastro-esophageal reflux disease without esophagitis: Secondary | ICD-10-CM | POA: Diagnosis not present

## 2015-06-17 DIAGNOSIS — M797 Fibromyalgia: Secondary | ICD-10-CM

## 2015-06-17 DIAGNOSIS — G4733 Obstructive sleep apnea (adult) (pediatric): Secondary | ICD-10-CM

## 2015-06-17 DIAGNOSIS — K589 Irritable bowel syndrome without diarrhea: Secondary | ICD-10-CM

## 2015-06-17 DIAGNOSIS — G4700 Insomnia, unspecified: Secondary | ICD-10-CM

## 2015-06-17 DIAGNOSIS — Z0000 Encounter for general adult medical examination without abnormal findings: Secondary | ICD-10-CM

## 2015-06-17 DIAGNOSIS — E213 Hyperparathyroidism, unspecified: Secondary | ICD-10-CM

## 2015-06-17 DIAGNOSIS — E2839 Other primary ovarian failure: Secondary | ICD-10-CM | POA: Diagnosis not present

## 2015-06-17 DIAGNOSIS — I10 Essential (primary) hypertension: Secondary | ICD-10-CM

## 2015-06-17 DIAGNOSIS — E785 Hyperlipidemia, unspecified: Secondary | ICD-10-CM

## 2015-06-17 DIAGNOSIS — Z1239 Encounter for other screening for malignant neoplasm of breast: Secondary | ICD-10-CM

## 2015-06-17 DIAGNOSIS — E559 Vitamin D deficiency, unspecified: Secondary | ICD-10-CM

## 2015-06-17 MED ORDER — ATORVASTATIN CALCIUM 40 MG PO TABS: 40.0000 mg | ORAL_TABLET | Freq: Every day | ORAL | Status: DC

## 2015-06-17 MED ORDER — METOPROLOL TARTRATE 50 MG PO TABS: 50.0000 mg | ORAL_TABLET | Freq: Two times a day (BID) | ORAL | Status: DC

## 2015-06-17 MED ORDER — GABAPENTIN 300 MG PO CAPS: 300.0000 mg | ORAL_CAPSULE | Freq: Two times a day (BID) | ORAL | Status: DC

## 2015-06-17 MED ORDER — OMEPRAZOLE 40 MG PO CPDR: 40.0000 mg | DELAYED_RELEASE_CAPSULE | Freq: Every day | ORAL | Status: DC

## 2015-06-17 MED ORDER — DIPHENOXYLATE-ATROPINE 2.5-0.025 MG PO TABS: 1.0000 | ORAL_TABLET | Freq: Four times a day (QID) | ORAL | Status: DC | PRN

## 2015-06-17 MED ORDER — TRAZODONE HCL 50 MG PO TABS: 50.0000 mg | ORAL_TABLET | Freq: Every day | ORAL | Status: DC

## 2015-06-17 NOTE — Progress Notes (Signed)
Pre visit review using our clinic review tool, if applicable. No additional management support is needed unless otherwise documented below in the visit note. 

## 2015-06-17 NOTE — Patient Instructions (Signed)
Preventive Care for Adults A healthy lifestyle and preventive care can promote health and wellness. Preventive health guidelines for women include the following key practices.  A routine yearly physical is a good way to check with your health care provider about your health and preventive screening. It is a chance to share any concerns and updates on your health and to receive a thorough exam.  Visit your dentist for a routine exam and preventive care every 6 months. Brush your teeth twice a day and floss once a day. Good oral hygiene prevents tooth decay and gum disease.  The frequency of eye exams is based on your age, health, family medical history, use of contact lenses, and other factors. Follow your health care provider's recommendations for frequency of eye exams.  Eat a healthy diet. Foods like vegetables, fruits, whole grains, low-fat dairy products, and lean protein foods contain the nutrients you need without too many calories. Decrease your intake of foods high in solid fats, added sugars, and salt. Eat the right amount of calories for you.Get information about a proper diet from your health care provider, if necessary.  Regular physical exercise is one of the most important things you can do for your health. Most adults should get at least 150 minutes of moderate-intensity exercise (any activity that increases your heart rate and causes you to sweat) each week. In addition, most adults need muscle-strengthening exercises on 2 or more days a week.  Maintain a healthy weight. The body mass index (BMI) is a screening tool to identify possible weight problems. It provides an estimate of body fat based on height and weight. Your health care provider can find your BMI and can help you achieve or maintain a healthy weight.For adults 20 years and older:  A BMI below 18.5 is considered underweight.  A BMI of 18.5 to 24.9 is normal.  A BMI of 25 to 29.9 is considered overweight.  A BMI of  30 and above is considered obese.  Maintain normal blood lipids and cholesterol levels by exercising and minimizing your intake of saturated fat. Eat a balanced diet with plenty of fruit and vegetables. Blood tests for lipids and cholesterol should begin at age 76 and be repeated every 5 years. If your lipid or cholesterol levels are high, you are over 50, or you are at high risk for heart disease, you may need your cholesterol levels checked more frequently.Ongoing high lipid and cholesterol levels should be treated with medicines if diet and exercise are not working.  If you smoke, find out from your health care provider how to quit. If you do not use tobacco, do not start.  Lung cancer screening is recommended for adults aged 22-80 years who are at high risk for developing lung cancer because of a history of smoking. A yearly low-dose CT scan of the lungs is recommended for people who have at least a 30-pack-year history of smoking and are a current smoker or have quit within the past 15 years. A pack year of smoking is smoking an average of 1 pack of cigarettes a day for 1 year (for example: 1 pack a day for 30 years or 2 packs a day for 15 years). Yearly screening should continue until the smoker has stopped smoking for at least 15 years. Yearly screening should be stopped for people who develop a health problem that would prevent them from having lung cancer treatment.  If you are pregnant, do not drink alcohol. If you are breastfeeding,  be very cautious about drinking alcohol. If you are not pregnant and choose to drink alcohol, do not have more than 1 drink per day. One drink is considered to be 12 ounces (355 mL) of beer, 5 ounces (148 mL) of wine, or 1.5 ounces (44 mL) of liquor.  Avoid use of street drugs. Do not share needles with anyone. Ask for help if you need support or instructions about stopping the use of drugs.  High blood pressure causes heart disease and increases the risk of  stroke. Your blood pressure should be checked at least every 1 to 2 years. Ongoing high blood pressure should be treated with medicines if weight loss and exercise do not work.  If you are 75-52 years old, ask your health care provider if you should take aspirin to prevent strokes.  Diabetes screening involves taking a blood sample to check your fasting blood sugar level. This should be done once every 3 years, after age 15, if you are within normal weight and without risk factors for diabetes. Testing should be considered at a younger age or be carried out more frequently if you are overweight and have at least 1 risk factor for diabetes.  Breast cancer screening is essential preventive care for women. You should practice "breast self-awareness." This means understanding the normal appearance and feel of your breasts and may include breast self-examination. Any changes detected, no matter how small, should be reported to a health care provider. Women in their 58s and 30s should have a clinical breast exam (CBE) by a health care provider as part of a regular health exam every 1 to 3 years. After age 16, women should have a CBE every year. Starting at age 53, women should consider having a mammogram (breast X-ray test) every year. Women who have a family history of breast cancer should talk to their health care provider about genetic screening. Women at a high risk of breast cancer should talk to their health care providers about having an MRI and a mammogram every year.  Breast cancer gene (BRCA)-related cancer risk assessment is recommended for women who have family members with BRCA-related cancers. BRCA-related cancers include breast, ovarian, tubal, and peritoneal cancers. Having family members with these cancers may be associated with an increased risk for harmful changes (mutations) in the breast cancer genes BRCA1 and BRCA2. Results of the assessment will determine the need for genetic counseling and  BRCA1 and BRCA2 testing.  Routine pelvic exams to screen for cancer are no longer recommended for nonpregnant women who are considered low risk for cancer of the pelvic organs (ovaries, uterus, and vagina) and who do not have symptoms. Ask your health care provider if a screening pelvic exam is right for you.  If you have had past treatment for cervical cancer or a condition that could lead to cancer, you need Pap tests and screening for cancer for at least 20 years after your treatment. If Pap tests have been discontinued, your risk factors (such as having a new sexual partner) need to be reassessed to determine if screening should be resumed. Some women have medical problems that increase the chance of getting cervical cancer. In these cases, your health care provider may recommend more frequent screening and Pap tests.  The HPV test is an additional test that may be used for cervical cancer screening. The HPV test looks for the virus that can cause the cell changes on the cervix. The cells collected during the Pap test can be  tested for HPV. The HPV test could be used to screen women aged 30 years and older, and should be used in women of any age who have unclear Pap test results. After the age of 30, women should have HPV testing at the same frequency as a Pap test.  Colorectal cancer can be detected and often prevented. Most routine colorectal cancer screening begins at the age of 50 years and continues through age 75 years. However, your health care provider may recommend screening at an earlier age if you have risk factors for colon cancer. On a yearly basis, your health care provider may provide home test kits to check for hidden blood in the stool. Use of a small camera at the end of a tube, to directly examine the colon (sigmoidoscopy or colonoscopy), can detect the earliest forms of colorectal cancer. Talk to your health care provider about this at age 50, when routine screening begins. Direct  exam of the colon should be repeated every 5-10 years through age 75 years, unless early forms of pre-cancerous polyps or small growths are found.  People who are at an increased risk for hepatitis B should be screened for this virus. You are considered at high risk for hepatitis B if:  You were born in a country where hepatitis B occurs often. Talk with your health care provider about which countries are considered high risk.  Your parents were born in a high-risk country and you have not received a shot to protect against hepatitis B (hepatitis B vaccine).  You have HIV or AIDS.  You use needles to inject street drugs.  You live with, or have sex with, someone who has hepatitis B.  You get hemodialysis treatment.  You take certain medicines for conditions like cancer, organ transplantation, and autoimmune conditions.  Hepatitis C blood testing is recommended for all people born from 1945 through 1965 and any individual with known risks for hepatitis C.  Practice safe sex. Use condoms and avoid high-risk sexual practices to reduce the spread of sexually transmitted infections (STIs). STIs include gonorrhea, chlamydia, syphilis, trichomonas, herpes, HPV, and human immunodeficiency virus (HIV). Herpes, HIV, and HPV are viral illnesses that have no cure. They can result in disability, cancer, and death.  You should be screened for sexually transmitted illnesses (STIs) including gonorrhea and chlamydia if:  You are sexually active and are younger than 24 years.  You are older than 24 years and your health care provider tells you that you are at risk for this type of infection.  Your sexual activity has changed since you were last screened and you are at an increased risk for chlamydia or gonorrhea. Ask your health care provider if you are at risk.  If you are at risk of being infected with HIV, it is recommended that you take a prescription medicine daily to prevent HIV infection. This is  called preexposure prophylaxis (PrEP). You are considered at risk if:  You are a heterosexual woman, are sexually active, and are at increased risk for HIV infection.  You take drugs by injection.  You are sexually active with a partner who has HIV.  Talk with your health care provider about whether you are at high risk of being infected with HIV. If you choose to begin PrEP, you should first be tested for HIV. You should then be tested every 3 months for as long as you are taking PrEP.  Osteoporosis is a disease in which the bones lose minerals and strength   with aging. This can result in serious bone fractures or breaks. The risk of osteoporosis can be identified using a bone density scan. Women ages 65 years and over and women at risk for fractures or osteoporosis should discuss screening with their health care providers. Ask your health care provider whether you should take a calcium supplement or vitamin D to reduce the rate of osteoporosis.  Menopause can be associated with physical symptoms and risks. Hormone replacement therapy is available to decrease symptoms and risks. You should talk to your health care provider about whether hormone replacement therapy is right for you.  Use sunscreen. Apply sunscreen liberally and repeatedly throughout the day. You should seek shade when your shadow is shorter than you. Protect yourself by wearing long sleeves, pants, a wide-brimmed hat, and sunglasses year round, whenever you are outdoors.  Once a month, do a whole body skin exam, using a mirror to look at the skin on your back. Tell your health care provider of new moles, moles that have irregular borders, moles that are larger than a pencil eraser, or moles that have changed in shape or color.  Stay current with required vaccines (immunizations).  Influenza vaccine. All adults should be immunized every year.  Tetanus, diphtheria, and acellular pertussis (Td, Tdap) vaccine. Pregnant women should  receive 1 dose of Tdap vaccine during each pregnancy. The dose should be obtained regardless of the length of time since the last dose. Immunization is preferred during the 27th-36th week of gestation. An adult who has not previously received Tdap or who does not know her vaccine status should receive 1 dose of Tdap. This initial dose should be followed by tetanus and diphtheria toxoids (Td) booster doses every 10 years. Adults with an unknown or incomplete history of completing a 3-dose immunization series with Td-containing vaccines should begin or complete a primary immunization series including a Tdap dose. Adults should receive a Td booster every 10 years.  Varicella vaccine. An adult without evidence of immunity to varicella should receive 2 doses or a second dose if she has previously received 1 dose. Pregnant females who do not have evidence of immunity should receive the first dose after pregnancy. This first dose should be obtained before leaving the health care facility. The second dose should be obtained 4-8 weeks after the first dose.  Human papillomavirus (HPV) vaccine. Females aged 13-26 years who have not received the vaccine previously should obtain the 3-dose series. The vaccine is not recommended for use in pregnant females. However, pregnancy testing is not needed before receiving a dose. If a female is found to be pregnant after receiving a dose, no treatment is needed. In that case, the remaining doses should be delayed until after the pregnancy. Immunization is recommended for any person with an immunocompromised condition through the age of 26 years if she did not get any or all doses earlier. During the 3-dose series, the second dose should be obtained 4-8 weeks after the first dose. The third dose should be obtained 24 weeks after the first dose and 16 weeks after the second dose.  Zoster vaccine. One dose is recommended for adults aged 60 years or older unless certain conditions are  present.  Measles, mumps, and rubella (MMR) vaccine. Adults born before 1957 generally are considered immune to measles and mumps. Adults born in 1957 or later should have 1 or more doses of MMR vaccine unless there is a contraindication to the vaccine or there is laboratory evidence of immunity to   each of the three diseases. A routine second dose of MMR vaccine should be obtained at least 28 days after the first dose for students attending postsecondary schools, health care workers, or international travelers. People who received inactivated measles vaccine or an unknown type of measles vaccine during 1963-1967 should receive 2 doses of MMR vaccine. People who received inactivated mumps vaccine or an unknown type of mumps vaccine before 1979 and are at high risk for mumps infection should consider immunization with 2 doses of MMR vaccine. For females of childbearing age, rubella immunity should be determined. If there is no evidence of immunity, females who are not pregnant should be vaccinated. If there is no evidence of immunity, females who are pregnant should delay immunization until after pregnancy. Unvaccinated health care workers born before 1957 who lack laboratory evidence of measles, mumps, or rubella immunity or laboratory confirmation of disease should consider measles and mumps immunization with 2 doses of MMR vaccine or rubella immunization with 1 dose of MMR vaccine.  Pneumococcal 13-valent conjugate (PCV13) vaccine. When indicated, a person who is uncertain of her immunization history and has no record of immunization should receive the PCV13 vaccine. An adult aged 19 years or older who has certain medical conditions and has not been previously immunized should receive 1 dose of PCV13 vaccine. This PCV13 should be followed with a dose of pneumococcal polysaccharide (PPSV23) vaccine. The PPSV23 vaccine dose should be obtained at least 8 weeks after the dose of PCV13 vaccine. An adult aged 19  years or older who has certain medical conditions and previously received 1 or more doses of PPSV23 vaccine should receive 1 dose of PCV13. The PCV13 vaccine dose should be obtained 1 or more years after the last PPSV23 vaccine dose.  Pneumococcal polysaccharide (PPSV23) vaccine. When PCV13 is also indicated, PCV13 should be obtained first. All adults aged 65 years and older should be immunized. An adult younger than age 65 years who has certain medical conditions should be immunized. Any person who resides in a nursing home or long-term care facility should be immunized. An adult smoker should be immunized. People with an immunocompromised condition and certain other conditions should receive both PCV13 and PPSV23 vaccines. People with human immunodeficiency virus (HIV) infection should be immunized as soon as possible after diagnosis. Immunization during chemotherapy or radiation therapy should be avoided. Routine use of PPSV23 vaccine is not recommended for American Indians, [place] Natives, or people younger than 65 years unless there are medical conditions that require PPSV23 vaccine. When indicated, people who have unknown immunization and have no record of immunization should receive PPSV23 vaccine. One-time revaccination 5 years after the first dose of PPSV23 is recommended for people aged 19-64 years who have chronic kidney failure, nephrotic syndrome, asplenia, or immunocompromised conditions. People who received 1-2 doses of PPSV23 before age 65 years should receive another dose of PPSV23 vaccine at age 65 years or later if at least 5 years have passed since the previous dose. Doses of PPSV23 are not needed for people immunized with PPSV23 at or after age 65 years.  Meningococcal vaccine. Adults with asplenia or persistent complement component deficiencies should receive 2 doses of quadrivalent meningococcal conjugate (MenACWY-D) vaccine. The doses should be obtained at least 2 months apart.  Microbiologists working with certain meningococcal bacteria, military recruits, people at risk during an outbreak, and people who travel to or live in countries with a high rate of meningitis should be immunized. A first-year college student up through age   21 years who is living in a residence hall should receive a dose if she did not receive a dose on or after her 16th birthday. Adults who have certain high-risk conditions should receive one or more doses of vaccine.  Hepatitis A vaccine. Adults who wish to be protected from this disease, have certain high-risk conditions, work with hepatitis A-infected animals, work in hepatitis A research labs, or travel to or work in countries with a high rate of hepatitis A should be immunized. Adults who were previously unvaccinated and who anticipate close contact with an international adoptee during the first 60 days after arrival in the [place] from a country with a high rate of hepatitis A should be immunized.  Hepatitis B vaccine. Adults who wish to be protected from this disease, have certain high-risk conditions, may be exposed to blood or other infectious body fluids, are household contacts or sex partners of hepatitis B positive people, are clients or workers in certain care facilities, or travel to or work in countries with a high rate of hepatitis B should be immunized.  Haemophilus influenzae type b (Hib) vaccine. A previously unvaccinated person with asplenia or sickle cell disease or having a scheduled splenectomy should receive 1 dose of Hib vaccine. Regardless of previous immunization, a recipient of a hematopoietic stem cell transplant should receive a 3-dose series 6-12 months after her successful transplant. Hib vaccine is not recommended for adults with HIV infection. Preventive Services / Frequency Ages 64 to 68 years  Blood pressure check.** / Every 1 to 2 years.  Lipid and cholesterol check.** / Every 5 years beginning at age  22.  Clinical breast exam.** / Every 3 years for women in their 88s and 53s.  BRCA-related cancer risk assessment.** / For women who have family members with a BRCA-related cancer (breast, ovarian, tubal, or peritoneal cancers).  Pap test.** / Every 2 years from ages 90 through 51. Every 3 years starting at age 21 through age 56 or 3 with a history of 3 consecutive normal Pap tests.  HPV screening.** / Every 3 years from ages 24 through ages 1 to 46 with a history of 3 consecutive normal Pap tests.  Hepatitis C blood test.** / For any individual with known risks for hepatitis C.  Skin self-exam. / Monthly.  Influenza vaccine. / Every year.  Tetanus, diphtheria, and acellular pertussis (Tdap, Td) vaccine.** / Consult your health care provider. Pregnant women should receive 1 dose of Tdap vaccine during each pregnancy. 1 dose of Td every 10 years.  Varicella vaccine.** / Consult your health care provider. Pregnant females who do not have evidence of immunity should receive the first dose after pregnancy.  HPV vaccine. / 3 doses over 6 months, if 72 and younger. The vaccine is not recommended for use in pregnant females. However, pregnancy testing is not needed before receiving a dose.  Measles, mumps, rubella (MMR) vaccine.** / You need at least 1 dose of MMR if you were born in 1957 or later. You may also need a 2nd dose. For females of childbearing age, rubella immunity should be determined. If there is no evidence of immunity, females who are not pregnant should be vaccinated. If there is no evidence of immunity, females who are pregnant should delay immunization until after pregnancy.  Pneumococcal 13-valent conjugate (PCV13) vaccine.** / Consult your health care provider.  Pneumococcal polysaccharide (PPSV23) vaccine.** / 1 to 2 doses if you smoke cigarettes or if you have certain conditions.  Meningococcal vaccine.** /  1 dose if you are age 19 to 21 years and a first-year college  student living in a residence hall, or have one of several medical conditions, you need to get vaccinated against meningococcal disease. You may also need additional booster doses.  Hepatitis A vaccine.** / Consult your health care provider.  Hepatitis B vaccine.** / Consult your health care provider.  Haemophilus influenzae type b (Hib) vaccine.** / Consult your health care provider. Ages 40 to 64 years  Blood pressure check.** / Every 1 to 2 years.  Lipid and cholesterol check.** / Every 5 years beginning at age 20 years.  Lung cancer screening. / Every year if you are aged 55-80 years and have a 30-pack-year history of smoking and currently smoke or have quit within the past 15 years. Yearly screening is stopped once you have quit smoking for at least 15 years or develop a health problem that would prevent you from having lung cancer treatment.  Clinical breast exam.** / Every year after age 40 years.  BRCA-related cancer risk assessment.** / For women who have family members with a BRCA-related cancer (breast, ovarian, tubal, or peritoneal cancers).  Mammogram.** / Every year beginning at age 40 years and continuing for as long as you are in good health. Consult with your health care provider.  Pap test.** / Every 3 years starting at age 30 years through age 65 or 70 years with a history of 3 consecutive normal Pap tests.  HPV screening.** / Every 3 years from ages 30 years through ages 65 to 70 years with a history of 3 consecutive normal Pap tests.  Fecal occult blood test (FOBT) of stool. / Every year beginning at age 50 years and continuing until age 75 years. You may not need to do this test if you get a colonoscopy every 10 years.  Flexible sigmoidoscopy or colonoscopy.** / Every 5 years for a flexible sigmoidoscopy or every 10 years for a colonoscopy beginning at age 50 years and continuing until age 75 years.  Hepatitis C blood test.** / For all people born from 1945 through  1965 and any individual with known risks for hepatitis C.  Skin self-exam. / Monthly.  Influenza vaccine. / Every year.  Tetanus, diphtheria, and acellular pertussis (Tdap/Td) vaccine.** / Consult your health care provider. Pregnant women should receive 1 dose of Tdap vaccine during each pregnancy. 1 dose of Td every 10 years.  Varicella vaccine.** / Consult your health care provider. Pregnant females who do not have evidence of immunity should receive the first dose after pregnancy.  Zoster vaccine.** / 1 dose for adults aged 60 years or older.  Measles, mumps, rubella (MMR) vaccine.** / You need at least 1 dose of MMR if you were born in 1957 or later. You may also need a 2nd dose. For females of childbearing age, rubella immunity should be determined. If there is no evidence of immunity, females who are not pregnant should be vaccinated. If there is no evidence of immunity, females who are pregnant should delay immunization until after pregnancy.  Pneumococcal 13-valent conjugate (PCV13) vaccine.** / Consult your health care provider.  Pneumococcal polysaccharide (PPSV23) vaccine.** / 1 to 2 doses if you smoke cigarettes or if you have certain conditions.  Meningococcal vaccine.** / Consult your health care provider.  Hepatitis A vaccine.** / Consult your health care provider.  Hepatitis B vaccine.** / Consult your health care provider.  Haemophilus influenzae type b (Hib) vaccine.** / Consult your health care provider. Ages 65   years and over  Blood pressure check.** / Every 1 to 2 years.  Lipid and cholesterol check.** / Every 5 years beginning at age 22 years.  Lung cancer screening. / Every year if you are aged 73-80 years and have a 30-pack-year history of smoking and currently smoke or have quit within the past 15 years. Yearly screening is stopped once you have quit smoking for at least 15 years or develop a health problem that would prevent you from having lung cancer  treatment.  Clinical breast exam.** / Every year after age 4 years.  BRCA-related cancer risk assessment.** / For women who have family members with a BRCA-related cancer (breast, ovarian, tubal, or peritoneal cancers).  Mammogram.** / Every year beginning at age 40 years and continuing for as long as you are in good health. Consult with your health care provider.  Pap test.** / Every 3 years starting at age 9 years through age 34 or 91 years with 3 consecutive normal Pap tests. Testing can be stopped between 65 and 70 years with 3 consecutive normal Pap tests and no abnormal Pap or HPV tests in the past 10 years.  HPV screening.** / Every 3 years from ages 57 years through ages 64 or 45 years with a history of 3 consecutive normal Pap tests. Testing can be stopped between 65 and 70 years with 3 consecutive normal Pap tests and no abnormal Pap or HPV tests in the past 10 years.  Fecal occult blood test (FOBT) of stool. / Every year beginning at age 15 years and continuing until age 17 years. You may not need to do this test if you get a colonoscopy every 10 years.  Flexible sigmoidoscopy or colonoscopy.** / Every 5 years for a flexible sigmoidoscopy or every 10 years for a colonoscopy beginning at age 86 years and continuing until age 71 years.  Hepatitis C blood test.** / For all people born from [birth year] through 1963 and any individual with known risks for hepatitis C.  Osteoporosis screening.** / A one-time screening for women ages 83 years and over and women at risk for fractures or osteoporosis.  Skin self-exam. / Monthly.  Influenza vaccine. / Every year.  Tetanus, diphtheria, and acellular pertussis (Tdap/Td) vaccine.** / 1 dose of Td every 10 years.  Varicella vaccine.** / Consult your health care provider.  Zoster vaccine.** / 1 dose for adults aged 61 years or older.  Pneumococcal 13-valent conjugate (PCV13) vaccine.** / Consult your health care provider.  Pneumococcal  polysaccharide (PPSV23) vaccine.** / 1 dose for all adults aged 28 years and older.  Meningococcal vaccine.** / Consult your health care provider.  Hepatitis A vaccine.** / Consult your health care provider.  Hepatitis B vaccine.** / Consult your health care provider.  Haemophilus influenzae type b (Hib) vaccine.** / Consult your health care provider. ** Family history and personal history of risk and conditions may change your health care provider's recommendations. Document Released: 01/08/2002 Document Revised: 03/29/2014 Document Reviewed: 04/09/2011 [facility] Patient Information 2015 [place], [place]. This information is not intended to replace advice given to you by your health care provider. Make sure you discuss any questions you have with your health care provider.

## 2015-06-17 NOTE — Progress Notes (Signed)
Subjective:   Stephanie Mckenzie is a 74 y.o. female who presents for Medicare Annual (Subsequent) preventive examination.  Pt states she was supposed to have full sleep study but it was never scheduled with previous dr.  She had a "mini" sleep study done she says and she was told she had sleep apnea but she never got the mask.  She is still very depressed and is seeing her psych.  Referrals for neuro for confusion and endo for hyperparathyroidism are pending.    Review of Systems:   Review of Systems  Constitutional: Negative for activity change, appetite change and fatigue.  HENT: Negative, congestion, tinnitus and ear discharge.  + severe hearing loss Eyes: Negative for visual disturbance (see optho q1y -- having cataract surgery Aug 1) Respiratory: Negative for cough, chest tightness and shortness of breath.   Cardiovascular: Negative for chest pain, palpitations and leg swelling.  Gastrointestinal: Negative for abdominal pain, diarrhea, constipation and abdominal distention.  Genitourinary: Negative for urgency, frequency, decreased urine volume and difficulty urinating.  Musculoskeletal: Negative for back pain, arthralgias and gait problem.  Skin: Negative for color change, pallor and rash.  Neurological: Negative for dizziness, light-headedness, numbness and headaches.  Hematological: Negative for adenopathy. Does not bruise/bleed easily.  Psychiatric/Behavioral: Negative for suicidal ideas, + confusion at times Pt is able to read and write and can do all ADLs No risk for falling No abuse/ violence in home           Objective:     Vitals: BP 133/82 mmHg  Pulse 87  Temp(Src) 98.3 F (36.8 C) (Oral)  Resp 18  Ht 5\' 4"  (1.626 m)  Wt 216 lb 12.8 oz (98.34 kg)  BMI 37.20 kg/m2  SpO2 97% BP 133/82 mmHg  Pulse 87  Temp(Src) 98.3 F (36.8 C) (Oral)  Resp 18  Ht 5\' 4"  (1.626 m)  Wt 216 lb 12.8 oz (98.34 kg)  BMI 37.20 kg/m2  SpO2 97% General appearance: alert,  cooperative, appears stated age and no distress Head: Normocephalic, without obvious abnormality, atraumatic Eyes: negative findings: lids and lashes normal, conjunctivae and sclerae normal and pupils equal, round, reactive to light and accomodation Ears: normal TM's and external ear canals both ears Nose: Nares normal. Septum midline. Mucosa normal. No drainage or sinus tenderness. Throat: lips, mucosa, and tongue normal; teeth and gums normal Neck: no adenopathy, no JVD, supple, symmetrical, trachea midline and thyroid not enlarged, symmetric, no tenderness/mass/nodules Back: symmetric, no curvature. ROM normal. No CVA tenderness. Lungs: clear to auscultation bilaterally Heart: S1, S2 normal Abdomen: soft, non-tender; bowel sounds normal; no masses,  no organomegaly Extremities: extremities normal, atraumatic, no cyanosis or edema Pulses: 2+ and symmetric Skin: Skin color, texture, turgor normal. No rashes or lesions Lymph nodes: Cervical, supraclavicular, and axillary nodes normal. Neurologic: Alert and oriented X 3, normal strength and tone. Normal symmetric reflexes. Normal coordination and gait Tobacco History  Smoking status  . Former Smoker  Smokeless tobacco  . Not on file     Counseling given: Not Answered   Past Medical History  Diagnosis Date  . Hypertension   . Migraine   . Depression    Past Surgical History  Procedure Laterality Date  . Cesarean section    . Appendectomy    . Parathyroidectomy    . Laminectomy    . Coronary angioplasty    . Abdominal hysterectomy     History reviewed. No pertinent family history. History  Sexual Activity  . Sexual Activity:  Not on file    Outpatient Encounter Prescriptions as of 06/17/2015  Medication Sig  . ALPRAZolam (XANAX) 0.5 MG tablet Take 1 tablet every night at bedtime (Patient taking differently: Take 0.5 mg by mouth 2 (two) times daily as needed. Take 1 tablet every night at bedtime)  . aspirin 81 MG tablet  Take 1 tablet (81 mg total) by mouth daily.  Marland Kitchen atorvastatin (LIPITOR) 40 MG tablet Take 40 mg by mouth daily.  . butalbital-acetaminophen-caffeine (FIORICET WITH CODEINE) 50-325-40-30 MG per capsule Take 1 capsule by mouth every 4 (four) hours as needed for headache.  . chlorpheniramine (CHLOR-TRIMETON) 4 MG tablet Take 4 mg by mouth 2 (two) times daily as needed for allergies.  Marland Kitchen diltiazem (CARDIZEM CD) 180 MG 24 hr capsule Take 1 capsule (180 mg total) by mouth daily.  Marland Kitchen gabapentin (NEURONTIN) 300 MG capsule Take 300 mg by mouth. At breakfast and then at lunch  . lidocaine (LIDODERM) 5 % Place 3 patches onto the skin every 12 (twelve) hours as needed.  . metoprolol (LOPRESSOR) 50 MG tablet Take 1 tablet (50 mg total) by mouth 2 (two) times daily. (Patient taking differently: Take 50 mg by mouth daily. )  . metoprolol (LOPRESSOR) 50 MG tablet Take 50 mg by mouth 2 (two) times daily.  . mirtazapine (REMERON) 45 MG tablet Take 45 mg by mouth at bedtime as needed.  . pantoprazole (PROTONIX) 40 MG tablet Take 1 tablet (40 mg total) by mouth daily.  . ranolazine (RANEXA) 500 MG 12 hr tablet Take 1 tablet (500 mg total) by mouth 2 (two) times daily.  Marland Kitchen torsemide (DEMADEX) 20 MG tablet 2 po qd  . traMADol (ULTRAM) 50 MG tablet Take 50 mg by mouth 3 (three) times daily as needed for moderate pain.   . traZODone (DESYREL) 50 MG tablet Take 1 tablet (50 mg total) by mouth at bedtime.  . Vilazodone HCl 20 MG TABS Take 20 mg by mouth daily.  . vitamin B-12 (CYANOCOBALAMIN) 1000 MCG tablet Take 1,000 mcg by mouth daily.   No facility-administered encounter medications on file as of 06/17/2015.    Activities of Daily Living In your present state of health, do you have any difficulty performing the following activities: 05/12/2015  Hearing? N  Vision? N  Difficulty concentrating or making decisions? Y  Walking or climbing stairs? N  Dressing or bathing? N    Patient Care Team: Rosalita Chessman, DO as  PCP - General (Family Medicine) Sherryl Barters, MD as Referring Physician (Cardiology) Vladimir Crofts, MD (Gastroenterology) Paschal Dopp. Quay Burow, MD (Endocrinology) Cameron Sprang, MD as Consulting Physician (Neurology)    Assessment:     Exercise Activities and Dietary recommendations    Goals    None     Fall Risk Fall Risk  05/26/2015  Falls in the past year? Yes  Number falls in past yr: 2 or more  Injury with Fall? No   Depression Screen PHQ 2/9 Scores 05/26/2015  PHQ - 2 Score 6  PHQ- 9 Score 19     Cognitive Testing No flowsheet data found.  Immunization History  Administered Date(s) Administered  . Tdap 10/23/2012   Screening Tests Health Maintenance  Topic Date Due  . MAMMOGRAM  06/25/1993  . ZOSTAVAX  06/26/2003  . DEXA SCAN  06/25/2008  . PNA vac Low Risk Adult (1 of 2 - PCV13) 06/25/2008  . INFLUENZA VACCINE  06/27/2015  . TETANUS/TDAP  10/23/2022  . COLONOSCOPY  09/15/2024  Plan:    see AVS During the course of the visit the patient was educated and counseled about the following appropriate screening and preventive services:   Vaccines to include Pneumoccal, Influenza, Hepatitis B, Td, Zostavax, HCV  Electrocardiogram  Cardiovascular Disease  Colorectal cancer screening  Bone density screening  Diabetes screening  Glaucoma screening  Mammography/PAP  Nutrition counseling   Patient Instructions (the written plan) was given to the patient.  1. Breast cancer screening   - MM Digital Screening; Future  2. Estrogen deficiency   - DG Bone Density; Future  3. Preventative health care    4. Hyperlipidemia Check labs Cont lipitor 5. Essential hypertension Stable diltiazem and metoprolo  6. Vitamin D deficiency     7. IBS (irritable bowel syndrome) Lomotil prn  8. Hyperparathyroidism Needs endo f/u - Ambulatory referral to Endocrinology  9. Insomnia Needs sleep study - Ambulatory referral to  Pulmonology  10. OSA (obstructive sleep apnea)   - Ambulatory referral to [place], DO  06/17/2015

## 2015-07-25 ENCOUNTER — Ambulatory Visit (INDEPENDENT_AMBULATORY_CARE_PROVIDER_SITE_OTHER): Payer: PPO | Admitting: Neurology

## 2015-07-25 ENCOUNTER — Other Ambulatory Visit (INDEPENDENT_AMBULATORY_CARE_PROVIDER_SITE_OTHER): Payer: PPO

## 2015-07-25 ENCOUNTER — Encounter: Payer: Self-pay | Admitting: Neurology

## 2015-07-25 VITALS — BP 136/92 | HR 81 | Resp 16 | Wt 215.0 lb

## 2015-07-25 DIAGNOSIS — F32A Depression, unspecified: Secondary | ICD-10-CM

## 2015-07-25 DIAGNOSIS — R413 Other amnesia: Secondary | ICD-10-CM | POA: Diagnosis not present

## 2015-07-25 DIAGNOSIS — F329 Major depressive disorder, single episode, unspecified: Secondary | ICD-10-CM

## 2015-07-25 LAB — VITAMIN B12: Vitamin B-12: 396 pg/mL (ref 211–911)

## 2015-07-25 NOTE — Patient Instructions (Signed)
1. Bloodwork for B12 2. Continue to work with psychiatrist and therapist on depression 3. If memory changes and confusion continue after treatment of depression, would proceed with MRI brain 4. Follow-up in 6 months

## 2015-07-25 NOTE — Progress Notes (Signed)
NEUROLOGY CONSULTATION NOTE  Stephanie Mckenzie MRN: [id number] DOB: May 23, 1941  Referring provider: Dr. Garnet Koyanagi Primary care provider: Dr. Garnet Koyanagi  Reason for consult:  Confusion, memory loss  Dear Dr Etter Sjogren:  Thank you for your kind referral of Stephanie Mckenzie for consultation of the above symptoms. Although her history is well known to you, please allow me to reiterate it for the purpose of our medical record. The patient was accompanied to the clinic by her daughter who also provides collateral information. Records and images were personally reviewed where available.  HISTORY OF PRESENT ILLNESS: This is a 74 year old right-handed woman with a history of hypertension, hyperlipidemia, depression, anxiety, fibromyalgia, presenting for evaluation of confusion and worsening memory. She and her daughter report confusion and memory changes became more noticeable after inpatient psychiatric admission for depression last June 2016. There was some medication changes made, she was confused about how she was supposed to take her medications. She went to her PCP office in July but apparently had no appointment and was scheduled to see psychiatry instead. She was noted to come in her pajama top inside out and backwards. Her daughter has noticed difficulties with word-finding, misplacing things at home. She had some confusion on gabapentin instructions, and her daughter had initially helped her filling her pillbox, but the patient now takes care of this herself and denies missing medications. She had a hysterectomy last November and had some confusion while on pain medications. She states that she gets confused about what she is doing, confused about the date and gets appointments mixed up. She lives by herself and denies getting lost driving. She occasionally forgets a bill payment. She easily gets distracted, loses her train of thought. Her daughter denies that she repeats herself, and denies any  paranoia. Her daughter does report that her mother does not care anymore about her her hygiene and would not bathe or change clothes.   She has frontal throbbing headaches every couple of days that did improved upon stopping Lisinopril. Headaches resolve pretty quickly if she takes Advil or Excedrin. If headaches are more severe, she takes BC powder, usually taking a couple a week. She has had some nausea the past couple of weeks, independent of the headaches ("I can't find anything to eat to soothe my stomach"). She has chronic back pain, as well as aching in her arms, shoulders, back, and legs. She has been taking Gabapentin 300mg  BID for the past couple of months for the fibromyalgia. She denies taking the Tramadol or Trazodone prescribed. She denies any dizziness, diplopia, dysarthria, dysphagia, focal numbness/tingling/weakness. No anosmia or tremors. She had a concussion 4-5 years ago with brief loss of consciousness. She drinks alcohol occasionally. No family history of memory loss. At the end of the visit, she endorsed continued depression, she feels the Trintillex started 4 weeks ago is not helping. She became tearful relating her stress with family, that they do not love her and do not call her if they would be gone for 3 days. I spoke to her daughter after the visit, and she reports she and her brother are very involved with her care, but that her mother has let her depression for 20+years go untreated, and it has been harder to deal with this.  Laboratory Data: Lab Results  Component Value Date   WBC 6.7 05/26/2015   HGB 11.9* 05/26/2015   HCT 36.2 05/26/2015   MCV 91.7 05/26/2015   PLT 263.0 05/26/2015  Chemistry      Component Value Date/Time   NA 143 05/26/2015 1232   K 4.1 05/26/2015 1232   CL 110 05/26/2015 1232   CO2 26 05/26/2015 1232   BUN 28* 05/26/2015 1232   CREATININE 1.14 05/26/2015 1232      Component Value Date/Time   CALCIUM 9.3 05/26/2015 1232   CALCIUM 9.1  05/26/2015 1232   ALKPHOS 69 05/26/2015 1232   AST 14 05/26/2015 1232   ALT 15 05/26/2015 1232   BILITOT 0.2 05/26/2015 1232      Lab Results  Component Value Date   TSH 1.94 05/26/2015    PAST MEDICAL HISTORY: Past Medical History  Diagnosis Date  . Hypertension   . Migraine   . Depression     PAST SURGICAL HISTORY: Past Surgical History  Procedure Laterality Date  . Cesarean section      x 2  . Appendectomy    . Parathyroidectomy    . Laminectomy    . Coronary angioplasty    . Abdominal hysterectomy      MEDICATIONS: Current Outpatient Prescriptions on File Prior to Visit  Medication Sig Dispense Refill  . ALPRAZolam (XANAX) 0.5 MG tablet Take 1 tablet every night at bedtime (Patient taking differently: Take 0.5 mg by mouth 2 (two) times daily as needed. Take 1 tablet every night at bedtime) 30 tablet 5  . aspirin 81 MG tablet Take 1 tablet (81 mg total) by mouth daily. (Patient taking differently: Take 81 mg by mouth. Take Demetrius Charity, Fri, Sat) 30 tablet 0  . atorvastatin (LIPITOR) 40 MG tablet Take 1 tablet (40 mg total) by mouth daily. 90 tablet 1  . butalbital-acetaminophen-caffeine (FIORICET WITH CODEINE) 50-325-40-30 MG per capsule Take 1 capsule by mouth every 4 (four) hours as needed for headache.    . chlorpheniramine (CHLOR-TRIMETON) 4 MG tablet Take 4 mg by mouth 2 (two) times daily as needed for allergies.    Marland Kitchen diltiazem (CARDIZEM CD) 180 MG 24 hr capsule Take 1 capsule (180 mg total) by mouth daily.    . diphenoxylate-atropine (LOMOTIL) 2.5-0.025 MG per tablet Take 1 tablet by mouth 4 (four) times daily as needed for diarrhea or loose stools. 30 tablet 0  . FLUoxetine (PROZAC) 40 MG capsule Take 40 mg by mouth daily.    Marland Kitchen gabapentin (NEURONTIN) 300 MG capsule Take 1 capsule (300 mg total) by mouth 2 (two) times daily. At breakfast and then at lunch 60 capsule 5  . metoprolol (LOPRESSOR) 50 MG tablet Take 50 mg by mouth 2 (two) times daily.    . mirtazapine  (REMERON) 45 MG tablet Take 45 mg by mouth at bedtime as needed.    Marland Kitchen omeprazole (PRILOSEC) 40 MG capsule Take 1 capsule (40 mg total) by mouth daily. 30 capsule 3  . torsemide (DEMADEX) 20 MG tablet 2 po qd 60 tablet 2  . vitamin B-12 (CYANOCOBALAMIN) 1000 MCG tablet Take 1,000 mcg by mouth daily.    . traMADol (ULTRAM) 50 MG tablet Take 50 mg by mouth 3 (three) times daily as needed for moderate pain.     . traZODone (DESYREL) 50 MG tablet Take 1 tablet (50 mg total) by mouth at bedtime. (Patient not taking: Reported on 07/25/2015)     No current facility-administered medications on file prior to visit.    ALLERGIES: Allergies  Allergen Reactions  . Lorazepam Rash and Other (See Comments)    hallucinations  hallucinations   . Acyclovir And Related   . Dilaudid [  Hydromorphone] Other (See Comments)    Causes confusion and hallucinations per pt's daughter  . Morphine And Related Itching  . Buprenorphine Hcl Itching    FAMILY HISTORY: Family History  Problem Relation Age of Onset  . Diabetes Mother   . Hypertension Mother   . Thyroid disease Mother     SOCIAL HISTORY: Social History   Social History  . Marital Status: Divorced    Spouse Name: N/A  . Number of Children: 2  . Years of Education: N/A   Occupational History  . Retired    Social History Main Topics  . Smoking status: Former Smoker    Types: Cigarettes  . Smokeless tobacco: Never Used  . Alcohol Use: 0.0 oz/week    0 Standard drinks or equivalent per week     Comment: occasional  . Drug Use: No  . Sexual Activity: Not on file   Other Topics Concern  . Not on file   Social History Narrative    REVIEW OF SYSTEMS: Constitutional: No fevers, chills, or sweats, no generalized fatigue, change in appetite Eyes: No visual changes, double vision, eye pain Ear, nose and throat: No hearing loss, ear pain, nasal congestion, sore throat Cardiovascular: No chest pain, palpitations Respiratory:  No shortness  of breath at rest or with exertion, wheezes GastrointestinaI: No nausea, vomiting, diarrhea, abdominal pain, fecal incontinence Genitourinary:  No dysuria, urinary retention or frequency Musculoskeletal:  No neck pain, +back pain Integumentary: No rash, pruritus, skin lesions Neurological: as above Psychiatric: + depression, insomnia, anxiety Endocrine: No palpitations, fatigue, diaphoresis, mood swings, change in appetite, change in weight, increased thirst Hematologic/Lymphatic:  No anemia, purpura, petechiae. Allergic/Immunologic: no itchy/runny eyes, nasal congestion, recent allergic reactions, rashes  PHYSICAL EXAM: Filed Vitals:   07/25/15 0855  BP: 136/92  Pulse: 81  Resp: 16   General: No acute distress. Tearful during the visit. Wears earring only on left ear.  Head:  Normocephalic/atraumatic Eyes: Fundoscopic exam shows bilateral sharp discs, no vessel changes, exudates, or hemorrhages Neck: supple, no paraspinal tenderness, full range of motion Back: No paraspinal tenderness Heart: regular rate and rhythm Lungs: Clear to auscultation bilaterally. Vascular: No carotid bruits. Skin/Extremities: No rash, no edema Neurological Exam: Mental status: alert and oriented to person, place, and time, no dysarthria or aphasia, Fund of knowledge is appropriate.  Recent and remote memory are intact.  Attention and concentration are normal.    Able to name objects and repeat phrases.  MMSE - Mini Mental State Exam 07/25/2015  Orientation to time 5  Orientation to Place 5  Registration 3  Attention/ Calculation 5  Recall 0  Language- name 2 objects 2  Language- repeat 1  Language- follow 3 step command 3  Language- read & follow direction 1  Write a sentence 1  Copy design 1  Total score 27   Cranial nerves: CN I: not tested CN II: pupils equal, round and reactive to light, visual fields intact, fundi unremarkable. CN III, IV, VI:  full range of motion, no nystagmus, no  ptosis CN V: facial sensation intact CN VII: upper and lower face symmetric CN VIII: hearing intact to finger rub CN IX, X: gag intact, uvula midline CN XI: sternocleidomastoid and trapezius muscles intact CN XII: tongue midline Bulk & Tone: normal, no fasciculations. Motor: 5/5 throughout with no pronator drift. Sensation: intact to light touch, cold, pin, vibration and joint position sense.  No extinction to double simultaneous stimulation.  Romberg test negative Deep Tendon Reflexes: +2 throughout  except for absent ankle jerks bilaterally, no ankle clonus Plantar responses: downgoing bilaterally Cerebellar: no incoordination on finger to nose, heel to shin. No dysdiadochokinesia Gait: narrow-based and steady, able to tandem walk adequately. Tremor: none  IMPRESSION: This is a 74 year old right-handed woman with a history of hypertension, hyperlipidemia, depression, anxiety, fibromyalgia, presenting for evaluation of confusion and memory changes. Her neurological exam today is overall normal, MMSE normal 27/30. We discussed different causes of memory loss. Check B12 level. She is noted to be tearful and very depressed about her family and health situation. Her daughter expressed separately their frustration with her perception that family does not love her. 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 We discussed doing an MRI brain without contrast to rule out any underlying structural abnormalities, she started crying again about all her medical bills, then about her family and how lonely she is. We agreed to continue with aggressive treatment of depression, continue working with her therapist and psychiatrist, as well [facility] If cognitive issues continue after better control of depression, we will proceed with MRI. We discussed the importance of control of vascular risk factors, physical  exercise, and brain stimulation exercises for brain health. She will follow-up in 6 months.   Thank you for allowing me to participate in the care of this patient. Please do not hesitate to call for any questions or concerns.   Stephanie Mckenzie, M.D.  CC: Dr. Etter Sjogren

## 2015-07-26 ENCOUNTER — Telehealth: Payer: Self-pay | Admitting: Family Medicine

## 2015-07-26 NOTE — Telephone Encounter (Signed)
-----   Message from Cameron Sprang, MD sent at 07/25/2015  2:04 PM EDT ----- Pls let her know B12 level is good. Thanks

## 2015-07-26 NOTE — Telephone Encounter (Signed)
Lmovm

## 2015-07-27 ENCOUNTER — Ambulatory Visit: Payer: Self-pay | Admitting: Endocrinology

## 2015-07-28 NOTE — Telephone Encounter (Signed)
Lmovm to return my call. 

## 2015-07-29 ENCOUNTER — Telehealth: Payer: Self-pay | Admitting: Neurology

## 2015-07-29 NOTE — Telephone Encounter (Signed)
Pt called for lab results/ call back @ [phone]

## 2015-07-29 NOTE — Telephone Encounter (Signed)
Patients B12 results were good. She was advised.

## 2015-08-02 NOTE — Telephone Encounter (Signed)
Result letter mailed to patient

## 2015-08-15 ENCOUNTER — Encounter: Payer: PPO | Admitting: Medical

## 2015-08-15 ENCOUNTER — Telehealth: Payer: Self-pay | Admitting: Medical

## 2015-08-15 NOTE — Progress Notes (Signed)
This encounter was created in error - please disregard.

## 2015-08-16 ENCOUNTER — Encounter: Payer: Self-pay | Admitting: Medical

## 2015-08-16 ENCOUNTER — Ambulatory Visit (INDEPENDENT_AMBULATORY_CARE_PROVIDER_SITE_OTHER): Payer: PPO | Admitting: Medical

## 2015-08-16 VITALS — BP 126/80 | HR 81 | Temp 97.5°F | Ht 64.0 in | Wt 213.6 lb

## 2015-08-16 DIAGNOSIS — K219 Gastro-esophageal reflux disease without esophagitis: Secondary | ICD-10-CM

## 2015-08-16 DIAGNOSIS — T7840XA Allergy, unspecified, initial encounter: Secondary | ICD-10-CM

## 2015-08-16 MED ORDER — OMEPRAZOLE 40 MG PO CPDR: 40.0000 mg | DELAYED_RELEASE_CAPSULE | Freq: Every day | ORAL | Status: DC

## 2015-08-16 MED ORDER — TRIAMCINOLONE ACETONIDE 0.025 % EX OINT: 1.0000 "application " | TOPICAL_OINTMENT | Freq: Two times a day (BID) | CUTANEOUS | Status: DC

## 2015-08-16 NOTE — Progress Notes (Signed)
Pre visit review using our clinic review tool, if applicable. No additional management support is needed unless otherwise documented below in the visit note. 

## 2015-08-16 NOTE — Patient Instructions (Signed)
Limited allergic reaction. Get dog groomed since this may be source. Until then avoid contact. Will rx kenalog ointment to use twice daily. Use benadryl gel once daily in between kenalog application. Use benadryl tab at night to stop itch and should help sleep.  Your rash does not require prednisone oral tabs presently and with upcoming surgery opted not to give oral treatment. If rash worsens then may need to give oral tabs.  Follow up 5 days or as needed

## 2015-08-16 NOTE — Progress Notes (Signed)
Subjective:    Patient ID: Stephanie Mckenzie, female    DOB: January 16, 1941, 74 y.o.   MRN: [id number]  HPI  She has some area on both arms that itch for past 2 wks.. Pt thinks that her dog may have got in bushes that her neighbor sprayed. Her dog was itching. Then her dog is who is a lap dog sat on her. Then she noticed rash on both bicep and forearm area.   On review pt does not report any suspicious exposure to soaps, creams, detergents, make up, lotions, detergents, plants or insect bites.  Pt rash is in the area of both arms. Pt reports no shortness of breath or wheezing. . Pt is not diabetic.  Pt is getting surgery for cataracts maybe this Thursday.   Review of Systems  Respiratory: Negative for cough, chest tightness, shortness of breath and wheezing.   Cardiovascular: Negative for chest pain and palpitations.  Skin: Positive for rash.       Itches.  Neurological: Negative for headaches.  Hematological: Negative for adenopathy. Does not bruise/bleed easily.    Past Medical History  Diagnosis Date  . Hypertension   . Migraine   . Depression     Social History   Social History  . Marital Status: Divorced    Spouse Name: N/A  . Number of Children: 2  . Years of Education: N/A   Occupational History  . Retired    Social History Main Topics  . Smoking status: Former Smoker    Types: Cigarettes  . Smokeless tobacco: Never Used  . Alcohol Use: 0.0 oz/week    0 Standard drinks or equivalent per week     Comment: occasional  . Drug Use: No  . Sexual Activity: Not on file   Other Topics Concern  . Not on file   Social History Narrative    Past Surgical History  Procedure Laterality Date  . Cesarean section      x 2  . Appendectomy    . Parathyroidectomy    . Laminectomy    . Coronary angioplasty    . Abdominal hysterectomy      Family History  Problem Relation Age of Onset  . Diabetes Mother   . Hypertension Mother   . Thyroid disease Mother      Allergies  Allergen Reactions  . Lorazepam Rash and Other (See Comments)    hallucinations  hallucinations   . Acyclovir And Related   . Dilaudid [Hydromorphone] Other (See Comments)    Causes confusion and hallucinations per pt's daughter  . Morphine And Related Itching  . Buprenorphine Hcl Itching    Current Outpatient Prescriptions on File Prior to Visit  Medication Sig Dispense Refill  . ALPRAZolam (XANAX) 0.5 MG tablet Take 1 tablet every night at bedtime (Patient taking differently: Take 0.5 mg by mouth 2 (two) times daily as needed. Take 1 tablet every night at bedtime) 30 tablet 5  . aspirin 81 MG tablet Take 1 tablet (81 mg total) by mouth daily. (Patient taking differently: Take 81 mg by mouth. Take Demetrius Charity, Fri, Sat) 30 tablet 0  . atorvastatin (LIPITOR) 40 MG tablet Take 1 tablet (40 mg total) by mouth daily. 90 tablet 1  . butalbital-acetaminophen-caffeine (FIORICET WITH CODEINE) 50-325-40-30 MG per capsule Take 1 capsule by mouth every 4 (four) hours as needed for headache.    . chlorpheniramine (CHLOR-TRIMETON) 4 MG tablet Take 4 mg by mouth 2 (two) times daily as needed for allergies.    Marland Kitchen  diltiazem (CARDIZEM CD) 180 MG 24 hr capsule Take 1 capsule (180 mg total) by mouth daily.    . diphenoxylate-atropine (LOMOTIL) 2.5-0.025 MG per tablet Take 1 tablet by mouth 4 (four) times daily as needed for diarrhea or loose stools. 30 tablet 0  . FLUoxetine (PROZAC) 40 MG capsule Take 40 mg by mouth daily.    Marland Kitchen gabapentin (NEURONTIN) 300 MG capsule Take 1 capsule (300 mg total) by mouth 2 (two) times daily. At breakfast and then at lunch 60 capsule 5  . metoprolol (LOPRESSOR) 50 MG tablet Take 50 mg by mouth 2 (two) times daily.    Marland Kitchen omeprazole (PRILOSEC) 40 MG capsule Take 1 capsule (40 mg total) by mouth daily. 30 capsule 3  . torsemide (DEMADEX) 20 MG tablet 2 po qd 60 tablet 2  . traMADol (ULTRAM) 50 MG tablet Take 50 mg by mouth 3 (three) times daily as needed for moderate  pain.     . traZODone (DESYREL) 50 MG tablet Take 1 tablet (50 mg total) by mouth at bedtime.    . vitamin B-12 (CYANOCOBALAMIN) 1000 MCG tablet Take 1,000 mcg by mouth daily.    . mirtazapine (REMERON) 45 MG tablet Take 45 mg by mouth at bedtime as needed.     No current facility-administered medications on file prior to visit.    BP 126/80 mmHg  Pulse 81  Temp(Src) 97.5 F (36.4 C) (Oral)  Ht 5\' 4"  (1.626 m)  Wt 213 lb 9.6 oz (96.888 kg)  BMI 36.65 kg/m2  SpO2 97%       Objective:   Physical Exam  General- No acute distress. Pleasant patient. Neck- Full range of motion, no jvd. No rash seen. Lungs- Clear, even and unlabored. Heart- regular rate and rhythm. Neurologic- CNII- XII grossly intact.  Derm- left arm. 4 scattered papules.(3 on bicep and one on forearm). No redness warmth or tenderness. Rt arm- 1 papule on rt forearm and 1 on bicep. No redness warmth or tenderness.       Assessment & Plan:  Limited allergic reaction. Get dog groomed since this may be source. Until then avoid contact. Will rx kenalog ointment to use twice daily. Use benadryl gel once daily in between kenalog application. Use benadryl tab at night to stop itch and should help sleep.  Your rash does not require prednisone oral tabs presently and with upcoming surgery opted not to give oral treatment. If rash worsens then may need to give oral tabs.  Follow up 5 days or as needed

## 2015-08-24 NOTE — Telephone Encounter (Signed)
No show
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
## 2015-08-31 ENCOUNTER — Encounter (HOSPITAL_BASED_OUTPATIENT_CLINIC_OR_DEPARTMENT_OTHER): Payer: Self-pay | Admitting: *Deleted

## 2015-08-31 ENCOUNTER — Other Ambulatory Visit: Payer: Self-pay

## 2015-08-31 ENCOUNTER — Emergency Department (HOSPITAL_BASED_OUTPATIENT_CLINIC_OR_DEPARTMENT_OTHER): Payer: PPO

## 2015-08-31 ENCOUNTER — Emergency Department (HOSPITAL_BASED_OUTPATIENT_CLINIC_OR_DEPARTMENT_OTHER)
Admission: EM | Admit: 2015-08-31 | Discharge: 2015-08-31 | Disposition: A | Discharge status: Home / Self Care | Payer: PPO | Attending: Emergency Medicine | Admitting: Emergency Medicine

## 2015-08-31 DIAGNOSIS — S0992XA Unspecified injury of nose, initial encounter: Secondary | ICD-10-CM | POA: Insufficient documentation

## 2015-08-31 DIAGNOSIS — Y92002 Bathroom of unspecified non-institutional (private) residence single-family (private) house as the place of occurrence of the external cause: Secondary | ICD-10-CM | POA: Insufficient documentation

## 2015-08-31 DIAGNOSIS — S0181XA Laceration without foreign body of other part of head, initial encounter: Secondary | ICD-10-CM | POA: Diagnosis present

## 2015-08-31 DIAGNOSIS — Z79899 Other long term (current) drug therapy: Secondary | ICD-10-CM | POA: Diagnosis not present

## 2015-08-31 DIAGNOSIS — Y9389 Activity, other specified: Secondary | ICD-10-CM | POA: Diagnosis not present

## 2015-08-31 DIAGNOSIS — F329 Major depressive disorder, single episode, unspecified: Secondary | ICD-10-CM | POA: Diagnosis not present

## 2015-08-31 DIAGNOSIS — G43909 Migraine, unspecified, not intractable, without status migrainosus: Secondary | ICD-10-CM | POA: Insufficient documentation

## 2015-08-31 DIAGNOSIS — S0012XA Contusion of left eyelid and periocular area, initial encounter: Secondary | ICD-10-CM

## 2015-08-31 DIAGNOSIS — Y998 Other external cause status: Secondary | ICD-10-CM | POA: Insufficient documentation

## 2015-08-31 DIAGNOSIS — S0083XA Contusion of other part of head, initial encounter: Secondary | ICD-10-CM

## 2015-08-31 DIAGNOSIS — Z87891 Personal history of nicotine dependence: Secondary | ICD-10-CM | POA: Diagnosis not present

## 2015-08-31 DIAGNOSIS — I10 Essential (primary) hypertension: Secondary | ICD-10-CM | POA: Insufficient documentation

## 2015-08-31 DIAGNOSIS — W01198A Fall on same level from slipping, tripping and stumbling with subsequent striking against other object, initial encounter: Secondary | ICD-10-CM | POA: Diagnosis not present

## 2015-08-31 DIAGNOSIS — Z7982 Long term (current) use of aspirin: Secondary | ICD-10-CM | POA: Diagnosis not present

## 2015-08-31 MED ORDER — ONDANSETRON 4 MG PO TBDP
4.0000 mg | ORAL_TABLET | Freq: Once | ORAL | Status: AC
  Administered 2015-08-31: 4 mg via ORAL
  Filled 2015-08-31: qty 1

## 2015-08-31 MED ORDER — HYDROCODONE-ACETAMINOPHEN 5-325 MG PO TABS
1.0000 | ORAL_TABLET | Freq: Once | ORAL | Status: AC
  Administered 2015-08-31: 1 via ORAL
  Filled 2015-08-31: qty 1

## 2015-08-31 MED ORDER — LIDOCAINE-EPINEPHRINE 1 %-1:100000 IJ SOLN
10.0000 mL | Freq: Once | INTRAMUSCULAR | Status: AC
  Administered 2015-08-31: 10 mL
  Filled 2015-08-31: qty 1

## 2015-08-31 NOTE — ED Notes (Signed)
Ice pack given

## 2015-08-31 NOTE — Discharge Instructions (Signed)
Suture removal in 7 days.  Eye Contusion  An eye contusion is a deep bruise of the eye. It is often called a "black eye." Contusions happen when an injury causes bleeding under the skin. Signs of bruising include pain, puffiness (swelling), and discolored skin. The contusion may turn blue, purple, or yellow. A black eye can be very serious and can affect the eyeball and sight. HOME CARE  Put ice on the injured area.  Put ice in a plastic bag.  Place a towel between your skin and the bag.  Leave the ice on for 15-20 minutes, 03-04 times a day.  If there is no injury to the eye, you may keep doing normal activities.  Wear sunglasses if bright lights bother you.  Sleep with your head raised (elevated) to help with discomfort.  Only take medicines as told by your doctor. GET HELP RIGHT AWAY IF:  You notice any vision loss.  You see two of everything (double vision).  You feel sick to your stomach (nauseous).  You feel dizzy, sleepy, or like you will pass out (faint).  You have any fluid coming from your eye or nose.  You have puffiness and bruising that does not fade. MAKE SURE YOU:   Understand these instructions.  Will watch your condition.  Will get help right away if you are not doing well or get worse.   This information is not intended to replace advice given to you by your health care provider. Make sure you discuss any questions you have with your health care provider.   Document Released: 11/01/2011 Document Revised: 02/04/2012 Document Reviewed: 07/19/2015 Elsevier Interactive Patient Education 2016 Else[employer]  Facial Laceration A facial laceration is a cut on the face. These injuries can be painful and cause bleeding. Some cuts may need to be closed with stitches (sutures), skin adhesive strips, or wound glue. Cuts usually heal quickly but can leave a scar. It can take 1-2 years for the scar to go away completely. HOME CARE   Only take medicines as told  by your doctor.  Follow your doctor's instructions for wound care. For Stitches:  Keep the cut clean and dry.  If you have a bandage (dressing), change it at least once a day. Change the bandage if it gets wet or dirty, or as told by your doctor.  Wash the cut with soap and water 2 times a day. Rinse the cut with water. Pat it dry with a clean towel.  Put a thin layer of medicated cream on the cut as told by your doctor.  You may shower after the first 24 hours. Do not soak the cut in water until the stitches are removed.  Have your stitches removed as told by your doctor.  Do not wear any makeup until a few days after your stitches are removed. For Skin Adhesive Strips:  Keep the cut clean and dry.  Do not get the strips wet. You may take a bath, but be careful to keep the cut dry.  If the cut gets wet, pat it dry with a clean towel.  The strips will fall off on their own. Do not remove the strips that are still stuck to the cut. For Wound Glue:  You may shower or take baths. Do not soak or scrub the cut. Do not swim. Avoid heavy sweating until the glue falls off on its own. After a shower or bath, pat the cut dry with a clean towel.  Do not  put medicine or makeup on your cut until the glue falls off.  If you have a bandage, do not put tape over the glue.  Avoid lots of sunlight or tanning lamps until the glue falls off.  The glue will fall off on its own in 5-10 days. Do not pick at the glue. After Healing:  Put sunscreen on the cut for the first year to reduce your scar. GET HELP IF:  You have a fever. GET HELP RIGHT AWAY IF:   Your cut area gets red, painful, or puffy (swollen).  You see a yellowish-white fluid (pus) coming from the cut.   This information is not intended to replace advice given to you by your health care provider. Make sure you discuss any questions you have with your health care provider.   Document Released: 04/30/2008 Document Revised:  12/03/2014 Document Reviewed: 06/25/2013 Elsevier Interactive Patient Education [employer].

## 2015-08-31 NOTE — ED Notes (Signed)
MD at bedside. 

## 2015-08-31 NOTE — ED Notes (Addendum)
States she got up at 0630 this am and went to [facility] when she got up and lost her balance and fell face first into bath tub. No LOC. Pt has bruising to bridge of nose and under left eye. Laceration to left eye brow. Bleeding controlled. Pt had cataract surg in the left eye 2 weeks ago.

## 2015-08-31 NOTE — ED Provider Notes (Signed)
CSN: [id number]     Arrival date & time 08/31/15  0758 History   First MD Initiated Contact with Patient 08/31/15 0800     No chief complaint on file.     HPI  Patient presents for evaluation after a fall. She awakened this morning. She felt normal. She ambulated a short distance to her bathroom. She had no symptoms. She sat down and use the bathroom. She states that she stood up. Still didn't denies being dizzy or lightheaded. However, she states that she lost her balance, and fell forward. She states she did not trauma lites and she was not able to hold onto the door because she could not see it. She fell forward and struck her face against the edge of her tub. She has a headache. She has bruising around her left eye. She has a cut above her left eyebrow. No loss of consciousness. No symptoms since. States she is not lightheaded or dizzy and has not been. No history of syncope. No history of micturition syncope or vasovagal episodes. Has not taken her morning medications. Had cataract surgery to her left eye 2 weeks ago. States her vision is better than before her surgery.  Past Medical History  Diagnosis Date  . Hypertension   . Migraine   . Depression    Past Surgical History  Procedure Laterality Date  . Cesarean section      x 2  . Appendectomy    . Parathyroidectomy    . Laminectomy    . Coronary angioplasty    . Abdominal hysterectomy     Family History  Problem Relation Age of Onset  . Diabetes Mother   . Hypertension Mother   . Thyroid disease Mother    Social History  Substance Use Topics  . Smoking status: Former Smoker    Types: Cigarettes  . Smokeless tobacco: Never Used  . Alcohol Use: 0.0 oz/week    0 Standard drinks or equivalent per week     Comment: occasional   OB History    No data available     Review of Systems  Constitutional: Negative for fever, chills, diaphoresis, appetite change and fatigue.  HENT: Negative for mouth sores, sore throat and  trouble swallowing.        Laceration above the left eye. Bruising around the left eye. Headache.  Eyes: Negative for visual disturbance.  Respiratory: Negative for cough, chest tightness, shortness of breath and wheezing.   Cardiovascular: Negative for chest pain.  Gastrointestinal: Negative for nausea, vomiting, abdominal pain, diarrhea and abdominal distention.  Endocrine: Negative for polydipsia, polyphagia and polyuria.  Genitourinary: Negative for dysuria, frequency and hematuria.  Musculoskeletal: Negative for gait problem.       Denies any neck or back pain.  Skin: Negative for color change, pallor and rash.  Neurological: Negative for dizziness, syncope, light-headedness and headaches.  Hematological: Does not bruise/bleed easily.  Psychiatric/Behavioral: Negative for behavioral problems and confusion.      Allergies  Lorazepam; Acyclovir and related; Dilaudid; Morphine and related; and Buprenorphine hcl  Home Medications   Prior to Admission medications   Medication Sig Start Date End Date Taking? Authorizing Provider  ALPRAZolam Duanne Moron) 0.5 MG tablet Take 1 tablet every night at bedtime Patient taking differently: Take 0.5 mg by mouth 2 (two) times daily as needed. Take 1 tablet every night at bedtime 05/08/13  Yes Tiffany L Reed, DO  aspirin 81 MG tablet Take 1 tablet (81 mg total) by mouth daily. Patient  taking differently: Take 81 mg by mouth. Take Arby Barrette, Sat 05/13/15  Yes Benjamine Mola, FNP  atorvastatin (LIPITOR) 40 MG tablet Take 1 tablet (40 mg total) by mouth daily. 06/17/15  Yes Yvonne R Lowne, DO  diphenoxylate-atropine (LOMOTIL) 2.5-0.025 MG per tablet Take 1 tablet by mouth 4 (four) times daily as needed for diarrhea or loose stools. 06/17/15  Yes Yvonne R Lowne, DO  FLUoxetine (PROZAC) 40 MG capsule Take 40 mg by mouth daily.   Yes Snezana Zivojina Cvejin, MD  gabapentin (NEURONTIN) 300 MG capsule Take 1 capsule (300 mg total) by mouth 2 (two) times daily. At  breakfast and then at lunch 06/17/15 06/16/16 Yes Yvonne R Lowne, DO  metoprolol (LOPRESSOR) 50 MG tablet Take 50 mg by mouth 2 (two) times daily. 05/20/15  Yes Historical Provider, MD  omeprazole (PRILOSEC) 40 MG capsule Take 1 capsule (40 mg total) by mouth daily. 08/16/15  Yes Edward Saguier, PA-C  torsemide (DEMADEX) 20 MG tablet 2 po qd 05/26/15  Yes Yvonne R Lowne, DO  traMADol (ULTRAM) 50 MG tablet Take 50 mg by mouth 3 (three) times daily as needed for moderate pain.    Yes Historical Provider, MD  traZODone (DESYREL) 50 MG tablet Take 1 tablet (50 mg total) by mouth at bedtime. 06/17/15  Yes Rosalita Chessman, DO  vitamin B-12 (CYANOCOBALAMIN) 1000 MCG tablet Take 1,000 mcg by mouth daily. 05/20/15 05/19/16 Yes Historical Provider, MD  mirtazapine (REMERON) 45 MG tablet Take 45 mg by mouth at bedtime as needed. 05/20/15 07/25/15  Historical Provider, MD   BP 166/80 mmHg  Pulse 81  Temp(Src) 97.7 F (36.5 C) (Oral)  Resp 20  Ht 5\' 4"  (1.626 m)  Wt 207 lb (93.895 kg)  BMI 35.51 kg/m2  SpO2 98% Physical Exam  Constitutional: She is oriented to person, place, and time. She appears well-developed and well-nourished. No distress.  HENT:  Head: Normocephalic.    Nose:    No proptosis or enophthalmos. Clear anterior chambers. Normal extraocular movements. Normal V1 to V3 sensation. No blood over TMs. No malocclusion or dental trauma.  Eyes: Conjunctivae are normal. Pupils are equal, round, and reactive to light. No scleral icterus.    Neck: Normal range of motion. Neck supple. No thyromegaly present.  Cardiovascular: Normal rate and regular rhythm.  Exam reveals no gallop and no friction rub.   No murmur heard. Pulmonary/Chest: Effort normal and breath sounds normal. No respiratory distress. She has no wheezes. She has no rales.  Abdominal: Soft. Bowel sounds are normal. She exhibits no distension. There is no tenderness. There is no rebound.  Musculoskeletal: Normal range of motion.    Neurological: She is alert and oriented to person, place, and time.  Skin: Skin is warm and dry. No rash noted.  Psychiatric: She has a normal mood and affect. Her behavior is normal.    ED Course  Procedures (including critical care time) Labs Review Labs Reviewed - No data to display  Imaging Review Ct Head Wo Contrast  08/31/2015   CLINICAL DATA:  Fall.  Facial contusions.  EXAM: CT HEAD WITHOUT CONTRAST  CT MAXILLOFACIAL WITHOUT CONTRAST  TECHNIQUE: Multidetector CT imaging of the head and maxillofacial structures were performed using the standard protocol without intravenous contrast. Multiplanar CT image reconstructions of the maxillofacial structures were also generated.  COMPARISON:  CT head 05/04/2015  FINDINGS: CT HEAD FINDINGS  Mild atrophy. Chronic microvascular ischemic change in the white matter. Hypodensity right posterior limb internal capsule unchanged  and consistent with chronic ischemia.  Negative for acute infarct.  Negative for hemorrhage or mass.  Negative for skull fracture.  Left frontal scalp laceration.  CT MAXILLOFACIAL FINDINGS  Left frontal scalp laceration.  Negative for facial fracture. Normal orbits. Normal mandible. Negative for nasal bone fracture.  Mild mucosal edema in the paranasal sinuses. No air-fluid levels. Mastoid sinus clear bilaterally.  IMPRESSION: Atrophy and chronic microvascular ischemia. No acute intracranial abnormality  Left frontal scalp laceration  Negative for facial fracture.   Electronically Signed   By: Franchot Gallo M.D.   On: 08/31/2015 08:45   Ct Maxillofacial Wo Cm  08/31/2015   CLINICAL DATA:  Fall.  Facial contusions.  EXAM: CT HEAD WITHOUT CONTRAST  CT MAXILLOFACIAL WITHOUT CONTRAST  TECHNIQUE: Multidetector CT imaging of the head and maxillofacial structures were performed using the standard protocol without intravenous contrast. Multiplanar CT image reconstructions of the maxillofacial structures were also generated.  COMPARISON:  CT  head 05/04/2015  FINDINGS: CT HEAD FINDINGS  Mild atrophy. Chronic microvascular ischemic change in the white matter. Hypodensity right posterior limb internal capsule unchanged and consistent with chronic ischemia.  Negative for acute infarct.  Negative for hemorrhage or mass.  Negative for skull fracture.  Left frontal scalp laceration.  CT MAXILLOFACIAL FINDINGS  Left frontal scalp laceration.  Negative for facial fracture. Normal orbits. Normal mandible. Negative for nasal bone fracture.  Mild mucosal edema in the paranasal sinuses. No air-fluid levels. Mastoid sinus clear bilaterally.  IMPRESSION: Atrophy and chronic microvascular ischemia. No acute intracranial abnormality  Left frontal scalp laceration  Negative for facial fracture.   Electronically Signed   By: Franchot Gallo M.D.   On: 08/31/2015 08:45   I have personally reviewed and evaluated these images and lab results as part of my medical decision-making.   EKG Interpretation   Date/Time:  Wednesday August 31 2015 08:39:22 EDT Ventricular Rate:  78 PR Interval:  130 QRS Duration: 84 QT Interval:  374 QTC Calculation: 426 R Axis:   68 Text Interpretation:  Normal sinus rhythm Normal ECG Confirmed by Jeneen Rinks   MD, [place] ([id number]) on 08/31/2015 8:52:28 AM      MDM   Final diagnoses:  Forehead laceration, initial encounter  Facial contusion, initial encounter  Periorbital ecchymosis, left, initial encounter    EKG shows normal sinus rhythm. CT scan shows no skull fracture, no acute findings. No facial fracture. No nasal fracture LACERATION REPAIR Performed by: Lolita Patella Authorized by: Lolita Patella Consent: Verbal consent obtained. Risks and benefits: risks, benefits and alternatives were discussed Consent given by: patient Patient identity confirmed: provided demographic data Prepped and Draped in normal sterile fashion Wound explored  Laceration Location: Left medial forehead superior to brow   Laceration  Length: 2cm  No Foreign Bodies seen or palpated  Anesthesia: local infiltration  Local anesthetic: lidocaine 1% c epinephrine  Anesthetic total: 4 ml  Irrigation method: syringe Amount of cleaning: standard  Skin closure: 5-0 Nylon  Number of sutures: 5  Technique: simple interrupted  Patient tolerance: Patient tolerated the procedure well with no immediate complications.     Tanna Furry, MD 08/31/15 [phone]

## 2015-08-31 NOTE — ED Notes (Signed)
Patient's daughter at bedside.

## 2015-09-02 ENCOUNTER — Emergency Department (HOSPITAL_BASED_OUTPATIENT_CLINIC_OR_DEPARTMENT_OTHER): Payer: PPO

## 2015-09-02 ENCOUNTER — Telehealth: Payer: Self-pay | Admitting: Family Medicine

## 2015-09-02 ENCOUNTER — Ambulatory Visit (INDEPENDENT_AMBULATORY_CARE_PROVIDER_SITE_OTHER): Payer: PPO | Admitting: Physician Assistant

## 2015-09-02 ENCOUNTER — Encounter: Payer: Self-pay | Admitting: Physician Assistant

## 2015-09-02 ENCOUNTER — Encounter (HOSPITAL_BASED_OUTPATIENT_CLINIC_OR_DEPARTMENT_OTHER): Payer: Self-pay | Admitting: *Deleted

## 2015-09-02 ENCOUNTER — Emergency Department (HOSPITAL_BASED_OUTPATIENT_CLINIC_OR_DEPARTMENT_OTHER)
Admission: EM | Admit: 2015-09-02 | Discharge: 2015-09-03 | Disposition: A | Discharge status: Other Acute Inpatient Hospital | Payer: PPO | Attending: Emergency Medicine | Admitting: Emergency Medicine

## 2015-09-02 VITALS — BP 132/88 | HR 80 | Resp 16

## 2015-09-02 DIAGNOSIS — G44319 Acute post-traumatic headache, not intractable: Secondary | ICD-10-CM

## 2015-09-02 DIAGNOSIS — I10 Essential (primary) hypertension: Secondary | ICD-10-CM | POA: Insufficient documentation

## 2015-09-02 DIAGNOSIS — Z79899 Other long term (current) drug therapy: Secondary | ICD-10-CM | POA: Insufficient documentation

## 2015-09-02 DIAGNOSIS — Y9389 Activity, other specified: Secondary | ICD-10-CM | POA: Insufficient documentation

## 2015-09-02 DIAGNOSIS — W1839XA Other fall on same level, initial encounter: Secondary | ICD-10-CM | POA: Insufficient documentation

## 2015-09-02 DIAGNOSIS — Z87891 Personal history of nicotine dependence: Secondary | ICD-10-CM | POA: Diagnosis not present

## 2015-09-02 DIAGNOSIS — R45851 Suicidal ideations: Secondary | ICD-10-CM

## 2015-09-02 DIAGNOSIS — F1310 Sedative, hypnotic or anxiolytic abuse, uncomplicated: Secondary | ICD-10-CM | POA: Insufficient documentation

## 2015-09-02 DIAGNOSIS — S0012XA Contusion of left eyelid and periocular area, initial encounter: Secondary | ICD-10-CM | POA: Diagnosis not present

## 2015-09-02 DIAGNOSIS — S0011XA Contusion of right eyelid and periocular area, initial encounter: Secondary | ICD-10-CM | POA: Insufficient documentation

## 2015-09-02 DIAGNOSIS — Y998 Other external cause status: Secondary | ICD-10-CM | POA: Insufficient documentation

## 2015-09-02 DIAGNOSIS — K219 Gastro-esophageal reflux disease without esophagitis: Secondary | ICD-10-CM | POA: Diagnosis not present

## 2015-09-02 DIAGNOSIS — Y92091 Bathroom in other non-institutional residence as the place of occurrence of the external cause: Secondary | ICD-10-CM | POA: Diagnosis not present

## 2015-09-02 DIAGNOSIS — G44309 Post-traumatic headache, unspecified, not intractable: Secondary | ICD-10-CM

## 2015-09-02 DIAGNOSIS — S0033XA Contusion of nose, initial encounter: Secondary | ICD-10-CM | POA: Insufficient documentation

## 2015-09-02 DIAGNOSIS — F329 Major depressive disorder, single episode, unspecified: Secondary | ICD-10-CM | POA: Diagnosis present

## 2015-09-02 DIAGNOSIS — Z7982 Long term (current) use of aspirin: Secondary | ICD-10-CM | POA: Insufficient documentation

## 2015-09-02 LAB — ETHANOL: Alcohol, Ethyl (B): <5 mg/dL (ref <5)

## 2015-09-02 LAB — CBC WITH DIFFERENTIAL/PLATELET
BASOS PCT: 1 %
Basophils Absolute: 0.1 10*3/uL (ref 0.0–0.1)
EOS ABS: 0.2 10*3/uL (ref 0.0–0.7)
EOS PCT: 3 %
HEMATOCRIT: 37.1 % (ref 36.0–46.0)
HEMOGLOBIN: 11.6 g/dL — AB (ref 12.0–15.0)
LYMPHS PCT: 35 %
Lymphs Abs: 1.9 10*3/uL (ref 0.7–4.0)
MCH: 29.4 pg (ref 26.0–34.0)
MCHC: 31.3 g/dL (ref 30.0–36.0)
MCV: 93.9 fL (ref 78.0–100.0)
Monocytes Absolute: 0.6 10*3/uL (ref 0.1–1.0)
Monocytes Relative: 11 %
NEUTROS PCT: 50 %
Neutro Abs: 2.7 10*3/uL (ref 1.7–7.7)
Platelets: 205 10*3/uL (ref 150–400)
RBC: 3.95 MIL/uL (ref 3.87–5.11)
RDW: 15.3 % (ref 11.5–15.5)
WBC: 5.5 10*3/uL (ref 4.0–10.5)

## 2015-09-02 LAB — BASIC METABOLIC PANEL
ANION GAP: 4 — AB (ref 5–15)
BUN: 15 mg/dL (ref 6–20)
CALCIUM: 8.6 mg/dL — AB (ref 8.9–10.3)
CHLORIDE: 110 mmol/L (ref 101–111)
CO2: 25 mmol/L (ref 22–32)
Creatinine, Ser: 0.84 mg/dL (ref 0.44–1.00)
GFR calc non Af Amer: >60 mL/min (ref >60)
Glucose, Bld: 88 mg/dL (ref 65–99)
Potassium: 4.6 mmol/L (ref 3.5–5.1)
Sodium: 139 mmol/L (ref 135–145)

## 2015-09-02 MED ORDER — ATORVASTATIN CALCIUM 40 MG PO TABS
40.0000 mg | ORAL_TABLET | Freq: Every day | ORAL | Status: DC
  Filled 2015-09-02: qty 1

## 2015-09-02 MED ORDER — GABAPENTIN 300 MG PO CAPS
300.0000 mg | ORAL_CAPSULE | Freq: Two times a day (BID) | ORAL | Status: DC
  Filled 2015-09-02: qty 1

## 2015-09-02 MED ORDER — SUVOREXANT 10 MG PO TABS: 10.0000 mg | ORAL_TABLET | Freq: Every day | ORAL | Status: DC

## 2015-09-02 MED ORDER — MAGNESIUM SULFATE 2 GM/50ML IV SOLN
2.0000 g | Freq: Once | INTRAVENOUS | Status: AC
  Administered 2015-09-02: 2 g via INTRAVENOUS
  Filled 2015-09-02: qty 50

## 2015-09-02 MED ORDER — LORAZEPAM 1 MG PO TABS
1.0000 mg | ORAL_TABLET | Freq: Every day | ORAL | Status: DC
  Administered 2015-09-02: 1 mg via ORAL
  Filled 2015-09-02: qty 1

## 2015-09-02 MED ORDER — BUTALBITAL-APAP-CAFFEINE 50-325-40 MG PO TABS: 1.0000 | ORAL_TABLET | Freq: Four times a day (QID) | ORAL | Status: DC | PRN

## 2015-09-02 MED ORDER — METOCLOPRAMIDE HCL 5 MG/ML IJ SOLN
10.0000 mg | Freq: Once | INTRAMUSCULAR | Status: AC
  Administered 2015-09-02: 10 mg via INTRAVENOUS
  Filled 2015-09-02: qty 2

## 2015-09-02 MED ORDER — FLUOXETINE HCL 40 MG PO CAPS: 40.0000 mg | ORAL_CAPSULE | Freq: Every day | ORAL | Status: DC

## 2015-09-02 MED ORDER — DIPHENHYDRAMINE HCL 50 MG/ML IJ SOLN
50.0000 mg | Freq: Once | INTRAMUSCULAR | Status: AC
  Administered 2015-09-02: 50 mg via INTRAVENOUS
  Filled 2015-09-02: qty 1

## 2015-09-02 MED ORDER — METOPROLOL TARTRATE 50 MG PO TABS
50.0000 mg | ORAL_TABLET | Freq: Two times a day (BID) | ORAL | Status: DC
  Filled 2015-09-02: qty 1

## 2015-09-02 MED ORDER — KETOROLAC TROMETHAMINE 30 MG/ML IJ SOLN
30.0000 mg | Freq: Once | INTRAMUSCULAR | Status: AC
  Administered 2015-09-02: 30 mg via INTRAVENOUS
  Filled 2015-09-02: qty 1

## 2015-09-02 MED ORDER — OMEPRAZOLE 40 MG PO CPDR: 40.0000 mg | DELAYED_RELEASE_CAPSULE | Freq: Every day | ORAL | Status: DC

## 2015-09-02 MED ORDER — TORSEMIDE 20 MG PO TABS
20.0000 mg | ORAL_TABLET | Freq: Every day | ORAL | Status: DC
  Filled 2015-09-02: qty 1

## 2015-09-02 MED ORDER — TRAZODONE HCL 50 MG PO TABS
50.0000 mg | ORAL_TABLET | Freq: Every day | ORAL | Status: DC
  Filled 2015-09-02: qty 1

## 2015-09-02 MED ORDER — KETOROLAC TROMETHAMINE 30 MG/ML IJ SOLN: 30.0000 mg | Freq: Once | INTRAMUSCULAR | Status: AC

## 2015-09-02 MED ORDER — PANTOPRAZOLE SODIUM 40 MG PO TBEC
40.0000 mg | DELAYED_RELEASE_TABLET | Freq: Every day | ORAL | Status: DC
  Administered 2015-09-03: 40 mg via ORAL
  Filled 2015-09-02: qty 1

## 2015-09-02 NOTE — Patient Instructions (Signed)
Please go to the pharmacy to pick up your medications. Take as directed while keeping up with your current regimen. Please stay hydrated. The medication will help you rest.  I will expedite an appointment for you to see your Psychiatrist. If anything worsens, please call [phone].

## 2015-09-02 NOTE — ED Notes (Signed)
Call placed to Staffing office for a sitter.

## 2015-09-02 NOTE — ED Notes (Signed)
PT on with TTS at this time

## 2015-09-02 NOTE — Progress Notes (Signed)
Patient was referred for inpatient geriatric psych treatment at [facility].  CSW will continue to follow up referral.  Verlon Setting, [place] Disposition staff 09/02/2015 9:32 PM

## 2015-09-02 NOTE — BH Assessment (Signed)
[facility] Assessment Progress Note    Clinician talked to patient again at her request.  Patient is pleading to be able to go back home.  Says she can keep herself safe.  Patient denies current SI & HI.  She says she did not initially come in to acute care clinic with depression and SI.  She said she came in because of headache.  Patient states that when she was asked how she may harm herself she was honest but that this does not mean she is going to do it.  Patient was told that she would need to be seen by psychiatrist to make decision about her leaving and that this would not happen until morning.  Clinician spoke with Dr. Maryan Rued and let her know he had seen patient again.  Clinician let her know that he had communicated with patient that she could be seen by psychiatry in the morning at the very least.  Dr. Maryan Rued said that both son & daughter are there and they have expressed concern about patient's mental state also.  Dr. Maryan Rued is checking on whether they are going to transfer her to [facility] or not.

## 2015-09-02 NOTE — ED Notes (Signed)
Pt spoke with MArcus from ACT team and is upset that she was told she is going to be admitted to the hospital.

## 2015-09-02 NOTE — Telephone Encounter (Signed)
FYI:  Patient No Show for 3:15p Appointment/SLS

## 2015-09-02 NOTE — Telephone Encounter (Signed)
Pt left vm at 12:38 requesting a call back at phone number [phone]. Pt sounded very frustrated. I called pt back and left a detailed message advising her to give [place] a call back once vm has been received. Pt didn't state what she needed on vm so I was unsure of how to help.

## 2015-09-02 NOTE — ED Notes (Signed)
Fall 3 days ago. She was seen here at that time. She drove herself to her MD's office today and was sent here for evaluation of depression. C.o headache.
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
## 2015-09-02 NOTE — ED Notes (Signed)
Patient talking with TTS again, daughter here and waiting for the patients to finish

## 2015-09-02 NOTE — ED Provider Notes (Signed)
Pt awaiting placement No acute issues at this time  ED ECG REPORT   Date: 09/02/2015   Rate: 79  Rhythm: normal sinus rhythm  QRS Axis: normal  Intervals: normal  ST/T Wave abnormalities: normal  Conduction Disutrbances:none   Ripley Fraise, MD 09/02/15 2359

## 2015-09-02 NOTE — ED Notes (Signed)
Sons number is [phone] - name is Liane Comber,

## 2015-09-02 NOTE — ED Notes (Signed)
Purse with phone and belonings given to son Autstin.   Bag of medications as well given to son.

## 2015-09-02 NOTE — ED Provider Notes (Signed)
CSN: [id number]     Arrival date & time 09/02/15  1807 History   First MD Initiated Contact with Patient 09/02/15 1823     Chief Complaint  Patient presents with  . Fall  . Depression     (Consider location/radiation/quality/duration/timing/severity/associated sxs/prior Treatment) HPI Stephanie Mckenzie is a 74 y.o. female with a history of hypertension, depression comes in for evaluation of headache and depression. Patient states 3 days ago she fell forward in her bathroom and has had a headache since that time. She was evaluated in the ED with a negative workup. She presents today to the adjoining acute care clinic concerned about the headache and also reports suicidal ideations. Patient reports that she does not have a good home life, will not elaborate. When asked if she has a plan to harm herself, she reports she will take her entire bottle of Xanax. She denies homicidal ideations, auditory or visual hallucinations. No alcohol or other illicit drug use.  Past Medical History  Diagnosis Date  . Hypertension   . Migraine   . Depression    Past Surgical History  Procedure Laterality Date  . Cesarean section      x 2  . Appendectomy    . Parathyroidectomy    . Laminectomy    . Coronary angioplasty    . Abdominal hysterectomy     Family History  Problem Relation Age of Onset  . Diabetes Mother   . Hypertension Mother   . Thyroid disease Mother    Social History  Substance Use Topics  . Smoking status: Former Smoker    Types: Cigarettes  . Smokeless tobacco: Never Used  . Alcohol Use: 0.0 oz/week    0 Standard drinks or equivalent per week     Comment: occasional   OB History    No data available     Review of Systems A 10 point review of systems was completed and was negative except for pertinent positives and negatives as mentioned in the history of present illness     Allergies  Lorazepam; Acyclovir and related; Dilaudid; Morphine and related; and Buprenorphine  hcl  Home Medications   Prior to Admission medications   Medication Sig Start Date End Date Taking? Authorizing Provider  aspirin 81 MG tablet Take 1 tablet (81 mg total) by mouth daily. Patient taking differently: Take 81 mg by mouth. Take Arby Barrette, Sat 05/13/15   Benjamine Mola, FNP  atorvastatin (LIPITOR) 40 MG tablet Take 1 tablet (40 mg total) by mouth daily. 06/17/15   Rosalita Chessman, DO  butalbital-acetaminophen-caffeine (FIORICET) 50-325-40 MG tablet Take 1 tablet by mouth every 6 (six) hours as needed for headache. 09/02/15 09/01/16  Brunetta Jeans, PA-C  diphenoxylate-atropine (LOMOTIL) 2.5-0.025 MG per tablet Take 1 tablet by mouth 4 (four) times daily as needed for diarrhea or loose stools. 06/17/15   Rosalita Chessman, DO  FLUoxetine (PROZAC) 40 MG capsule Take 40 mg by mouth daily.    Snezana Zivojina Cvejin, MD  gabapentin (NEURONTIN) 300 MG capsule Take 1 capsule (300 mg total) by mouth 2 (two) times daily. At breakfast and then at lunch 06/17/15 06/16/16  Alferd Apa Lowne, DO  LORazepam (ATIVAN) 0.5 MG tablet Take 1 mg by mouth at bedtime.    Historical Provider, MD  metoprolol (LOPRESSOR) 50 MG tablet Take 50 mg by mouth 2 (two) times daily. Patient only takes once daily 05/20/15   Historical Provider, MD  omeprazole (PRILOSEC) 40 MG capsule Take 1  capsule (40 mg total) by mouth daily. Patient takes Rx twice daily 09/02/15   Brunetta Jeans, PA-C  Suvorexant (BELSOMRA) 10 MG TABS Take 10 mg by mouth at bedtime. 09/02/15   Brunetta Jeans, PA-C  torsemide (DEMADEX) 20 MG tablet 2 po qd Patient taking differently: 2 po qd Patient states taking once daily 05/26/15   Rosalita Chessman, DO  traZODone (DESYREL) 50 MG tablet Take 1 tablet (50 mg total) by mouth at bedtime. 06/17/15   Rosalita Chessman, DO  vitamin B-12 (CYANOCOBALAMIN) 1000 MCG tablet Take 1,000 mcg by mouth daily. 05/20/15 05/19/16  Historical Provider, MD  Vortioxetine HBr (TRINTELLIX) 10 MG TABS Take by mouth. Patient cannot afford  cost of medication, taken three times this week    Historical Provider, MD   BP 139/57 mmHg  Pulse 66  Temp(Src) 97.5 F (36.4 C) (Oral)  Resp 20  Ht 5\' 4"  (1.626 m)  Wt 207 lb (93.895 kg)  BMI 35.51 kg/m2  SpO2 99% Physical Exam  Constitutional: She is oriented to person, place, and time. She appears well-developed and well-nourished.  HENT:  Head: Normocephalic and atraumatic.  Mouth/Throat: Oropharynx is clear and moist.  Eyes: Conjunctivae are normal. Pupils are equal, round, and reactive to light. Right eye exhibits no discharge. Left eye exhibits no discharge. No scleral icterus.  Bilateral periorbital ecchymosis as well as ecchymosis over bridge of nose  Neck: Neck supple.  Cardiovascular: Normal rate, regular rhythm and normal heart sounds.   Pulmonary/Chest: Effort normal and breath sounds normal. No respiratory distress. She has no wheezes. She has no rales.  Abdominal: Soft. There is no tenderness.  Musculoskeletal: She exhibits no tenderness.  Neurological: She is alert and oriented to person, place, and time.  Cranial Nerves II-XII grossly intact  Skin: Skin is warm and dry. No rash noted.  Psychiatric: She has a normal mood and affect.  Nursing note and vitals reviewed.   ED Course  Procedures (including critical care time) Labs Review Labs Reviewed  BASIC METABOLIC PANEL  CBC WITH DIFFERENTIAL/PLATELET  URINALYSIS, ROUTINE W REFLEX MICROSCOPIC (NOT AT [facility])    Imaging Review No results found. I have personally reviewed and evaluated these images and lab results as part of my medical decision-making.   EKG Interpretation None     Meds given in ED:  Medications  metoCLOPramide (REGLAN) injection 10 mg (10 mg Intravenous Given 09/02/15 1921)  diphenhydrAMINE (BENADRYL) injection 50 mg (50 mg Intravenous Given 09/02/15 1919)  ketorolac (TORADOL) 30 MG/ML injection 30 mg (30 mg Intravenous Given 09/02/15 1919)  magnesium sulfate IVPB 2 g 50 mL (0 g  Intravenous Stopped 09/02/15 2018)    New Prescriptions   No medications on file   Filed Vitals:   09/02/15 1817  BP: 139/57  Pulse: 66  Temp: 97.5 F (36.4 C)  TempSrc: Oral  Resp: 20  Height: 5\' 4"  (1.626 m)  Weight: 207 lb (93.895 kg)  SpO2: 99%    MDM  Vitals stable - WNL -afebrile Pt resting comfortably in ED. PE--physical exam as above. Bilateral periorbital ecchymosis. Nonfocal neuro exam. Labwork--ethanol, BMP, CBC are noncontributory. Urinalysis negative. UDS significant for benzodiazepine's and barbituates Imaging-chest x-ray shows no acute cardiopulmonary pathology.  DDX-patient with mechanical fall 3 days ago sustaining head injury comes in for evaluation of persistent headache. Original CT scan done was negative. We'll treat headache in the ED. Patient also has suicidal ideations with a plan of overdosing on Xanax she has at home. We'll  consult TTS. Discussed with [facility]/TTS, they recommended inpatient management, they are actively seeking placement at this time.  Patient will be placed in psych hold. Discussed my attending, Dr. Maryan Rued who also saw and evaluated the patient and agrees with plan. Final diagnoses:  Post-traumatic headache, not intractable, unspecified chronicity pattern  Suicidal ideations      Comer Locket, PA-C 09/03/15 [place], MD 09/07/15 2030

## 2015-09-02 NOTE — ED Notes (Signed)
Patient is upset that she is going to get admitted, the patient wants to talk to the counselor again

## 2015-09-02 NOTE — Telephone Encounter (Signed)
PLEASE NOTE: All timestamps contained within this report are represented as [place] Standard Time. CONFIDENTIALTY NOTICE: This fax transmission is intended only for the addressee. It contains information that is legally privileged, confidential or otherwise protected from use or disclosure. If you are not the intended recipient, you are strictly prohibited from reviewing, disclosing, copying using or disseminating any of this information or taking any action in reliance on or regarding this information. If you have received this fax in error, please notify us immediately by telephone so that we can arrange for its return to [place]. Phone: [phone], Toll-Free: [phone], Fax: [phone] Page: 1 of 1 Call Id: [id number] [facility] Patient Name: Stephanie Mckenzie DOB: 1941/10/17 Initial Comment Caller states Edd Arbour, fell Tues and hit head; has 7 stitches above left eye; has had headache since; having hard time coping with it; had CAT scan-they said no concussion; had cataract surgery on LT eye-same as when she fell; having pain between eyes and behind LT eye; ED MD said looked fine; Nurse Assessment Guidelines Guideline Title Affirmed Question Affirmed Notes Head Injury [1] SEVERE headache AND [2] not improved 2 hours after pain medicine/ice packs Final Disposition User Go to ED Now (or PCP triage) Mechele Dawley, RN, Amy Referrals REFERRED TO PCP OFFICE Disagree/Comply: Comply

## 2015-09-03 LAB — RAPID URINE DRUG SCREEN, HOSP PERFORMED
AMPHETAMINES: NOT DETECTED
BARBITURATES: POSITIVE — AB
Benzodiazepines: POSITIVE — AB
Cocaine: NOT DETECTED
Opiates: NOT DETECTED
TETRAHYDROCANNABINOL: NOT DETECTED

## 2015-09-03 LAB — URINALYSIS, ROUTINE W REFLEX MICROSCOPIC
GLUCOSE, UA: NEGATIVE mg/dL
HGB URINE DIPSTICK: NEGATIVE
Ketones, ur: 15 mg/dL — AB
Leukocytes, UA: NEGATIVE
Nitrite: NEGATIVE
PH: 5.5 (ref 5.0–8.0)
PROTEIN: NEGATIVE mg/dL
SPECIFIC GRAVITY, URINE: 1.03 (ref 1.005–1.030)
Urobilinogen, UA: 0.2 mg/dL (ref 0.0–1.0)

## 2015-09-03 NOTE — ED Provider Notes (Signed)
Pt stable in the ED Pt will be transferred to [facility] pending acceptance at [facility] or other psychiatric hospital Pt informed of plan BP 140/52 mmHg  Pulse 82  Temp(Src) 97.5 F (36.4 C) (Oral)  Resp 18  Ht 5\' 4"  (1.626 m)  Wt 207 lb (93.895 kg)  BMI 35.51 kg/m2  SpO2 16% D/w dr Roxanne Mins, will accept in transfer  Ripley Fraise, MD 09/03/15 [phone]

## 2015-09-03 NOTE — ED Provider Notes (Signed)
The patient appears reasonably stabilized for transfer considering the current resources, flow, and capabilities available in the ED at this time, and I doubt any other [facility] requiring further screening and/or treatment in the ED prior to transfer.   Accepted to [facility]  Accepted by Dr Olin Pia Pt stable at this time   Ripley Fraise, MD 09/03/15 0300

## 2015-09-05 NOTE — Telephone Encounter (Signed)
Spoke with patient's son, who says that pt is currently in [employer].  He's unsure of discharge date.  He was encouraged to have patient follow up with [place] after discharge as needed.

## 2015-09-06 DIAGNOSIS — G44319 Acute post-traumatic headache, not intractable: Secondary | ICD-10-CM | POA: Insufficient documentation

## 2015-09-06 NOTE — Assessment & Plan Note (Signed)
With plan to harm herself. Also with need for ER workup for severe headache s/p trauma. Discussed case with ER MD, Dr. Maryan Rued who is aware of patient's intent to harm herself to make sure this is addressed and inpatient management is obtained. Patient taken to ER via wheelchair by myself.

## 2015-09-06 NOTE — Assessment & Plan Note (Signed)
IM Toradol given with only minimal relief in symptoms. Rx Butalbital for use at home to relieve pain. Giving severity of symptoms and recent injury along with severe depression and SI with intent, patient sent to ER for further assessment and management.

## 2015-09-06 NOTE — Progress Notes (Signed)
Patient presents to clinic today as ER follow-up c/o severe headache after concussion sustained 2 night ago. Patient evaluated in the ER on 08/31/15 after hitting her head on her bathtub. Laceration was sutured. CT negative. Patient discharged to follow-up if symptoms not improving. Patient endorses severe headache not responsive to OTC pain medications. Is affecting sleep which in turn is exacerbating her severe depression. Endorses nausea but denies vomiting. Denies AMS. Endorses depressed mood with anhedonia and suicidal ideation. Endorses giving thought to suicide attempt via Xanax. States she feels her family doesn't care anymore and that no one will miss her anyway. Is followed by Psychiatry but cannot get appointment until 4 weeks from now.  Of note patient arrived to her appointment > 1 hour after appointment time. Was found sitting against her car in the parking lot, in the rain, crying hysterically by a [facility] employee.  Past Medical History  Diagnosis Date  . Hypertension   . Migraine   . Depression     Current Outpatient Prescriptions on File Prior to Visit  Medication Sig Dispense Refill  . aspirin 81 MG tablet Take 1 tablet (81 mg total) by mouth daily. (Patient taking differently: Take 81 mg by mouth. Take Demetrius Charity, Fri, Sat) 30 tablet 0  . atorvastatin (LIPITOR) 40 MG tablet Take 1 tablet (40 mg total) by mouth daily. 90 tablet 1  . diphenoxylate-atropine (LOMOTIL) 2.5-0.025 MG per tablet Take 1 tablet by mouth 4 (four) times daily as needed for diarrhea or loose stools. 30 tablet 0  . FLUoxetine (PROZAC) 40 MG capsule Take 40 mg by mouth daily.    Marland Kitchen gabapentin (NEURONTIN) 300 MG capsule Take 1 capsule (300 mg total) by mouth 2 (two) times daily. At breakfast and then at lunch 60 capsule 5  . metoprolol (LOPRESSOR) 50 MG tablet Take 50 mg by mouth 2 (two) times daily. Patient only takes once daily    . torsemide (DEMADEX) 20 MG tablet 2 po qd (Patient taking differently: 2 po  qd Patient states taking once daily) 60 tablet 2  . traZODone (DESYREL) 50 MG tablet Take 1 tablet (50 mg total) by mouth at bedtime.    . vitamin B-12 (CYANOCOBALAMIN) 1000 MCG tablet Take 1,000 mcg by mouth daily.     No current facility-administered medications on file prior to visit.    Allergies  Allergen Reactions  . Acyclovir And Related   . Dilaudid [Hydromorphone] Other (See Comments)    Causes confusion and hallucinations per pt's daughter  . Morphine And Related Itching  . Buprenorphine Hcl Itching    Family History  Problem Relation Age of Onset  . Diabetes Mother   . Hypertension Mother   . Thyroid disease Mother     Social History   Social History  . Marital Status: Divorced    Spouse Name: N/A  . Number of Children: 2  . Years of Education: N/A   Occupational History  . Retired    Social History Main Topics  . Smoking status: Former Smoker    Types: Cigarettes  . Smokeless tobacco: Never Used  . Alcohol Use: 0.0 oz/week    0 Standard drinks or equivalent per week     Comment: occasional  . Drug Use: No  . Sexual Activity: Not Asked   Other Topics Concern  . None   Social History Narrative    Review of Systems - See HPI.  All other ROS are negative.  BP 132/88 mmHg  Pulse 80  Resp 16  Physical Exam  Constitutional: She is oriented to person, place, and time and well-developed, well-nourished, and in no distress.  HENT:  Head: Normocephalic.  Right Ear: External ear normal.  Left Ear: External ear normal.  Nose: Nose normal.  Mouth/Throat: Oropharynx is clear and moist. No oropharyngeal exudate.  Left supraorbital laceration s/p suture noted with routine healing  Eyes: Conjunctivae and EOM are normal. Pupils are equal, round, and reactive to light.  Neck: Neck supple.  Neurological: She is alert and oriented to person, place, and time. No cranial nerve deficit. GCS score is 15.  Skin: Skin is warm and dry. No rash noted.  Psychiatric:  Her mood appears anxious. Her affect is labile. She exhibits a depressed mood. She expresses suicidal ideation. She expresses suicidal plans. She exhibits disordered thought content.    Recent Results (from the past 2160 hour(s))  Vitamin B12     Status: None   Collection Time: 07/25/15 10:14 AM  Result Value Ref Range   Vitamin B-12 396 211 - 911 pg/mL  Basic metabolic panel     Status: Abnormal   Collection Time: 09/02/15  7:20 PM  Result Value Ref Range   Sodium 139 135 - 145 mmol/L   Potassium 4.6 3.5 - 5.1 mmol/L   Chloride 110 101 - 111 mmol/L   CO2 25 22 - 32 mmol/L   Glucose, Bld 88 65 - 99 mg/dL   BUN 15 6 - 20 mg/dL   Creatinine, Ser 0.84 0.44 - 1.00 mg/dL   Calcium 8.6 (L) 8.9 - 10.3 mg/dL   GFR calc non Af Amer >60 >60 mL/min   GFR calc Af Amer >60 >60 mL/min    Comment: (NOTE) The eGFR has been calculated using the CKD EPI equation. This calculation has not been validated in all clinical situations. eGFR's persistently <60 mL/min signify possible Chronic Kidney Disease.    Anion gap 4 (L) 5 - 15  CBC with Differential     Status: Abnormal   Collection Time: 09/02/15  7:20 PM  Result Value Ref Range   WBC 5.5 4.0 - 10.5 K/uL    Comment: WHITE COUNT CONFIRMED ON SMEAR   RBC 3.95 3.87 - 5.11 MIL/uL   Hemoglobin 11.6 (L) 12.0 - 15.0 g/dL   HCT 37.1 36.0 - 46.0 %   MCV 93.9 78.0 - 100.0 fL   MCH 29.4 26.0 - 34.0 pg   MCHC 31.3 30.0 - 36.0 g/dL   RDW 15.3 11.5 - 15.5 %   Platelets 205 150 - 400 K/uL    Comment: PLATELET COUNT CONFIRMED BY SMEAR PLATELET CLUMPS NOTED ON SMEAR, COUNT APPEARS ADEQUATE    Neutrophils Relative % 50 %   Lymphocytes Relative 35 %   Monocytes Relative 11 %   Eosinophils Relative 3 %   Basophils Relative 1 %   Neutro Abs 2.7 1.7 - 7.7 K/uL   Lymphs Abs 1.9 0.7 - 4.0 K/uL   Monocytes Absolute 0.6 0.1 - 1.0 K/uL   Eosinophils Absolute 0.2 0.0 - 0.7 K/uL   Basophils Absolute 0.1 0.0 - 0.1 K/uL  Ethanol     Status: None   Collection  Time: 09/02/15  9:08 PM  Result Value Ref Range   Alcohol, Ethyl (B) <5 <5 mg/dL    Comment:        LOWEST DETECTABLE LIMIT FOR SERUM ALCOHOL IS 5 mg/dL FOR MEDICAL PURPOSES ONLY   Urinalysis, Routine w reflex microscopic     Status: Abnormal  Collection Time: 09/03/15 12:59 AM  Result Value Ref Range   Color, Urine YELLOW YELLOW   APPearance CLEAR CLEAR   Specific Gravity, Urine 1.030 1.005 - 1.030   pH 5.5 5.0 - 8.0   Glucose, UA NEGATIVE NEGATIVE mg/dL   Hgb urine dipstick NEGATIVE NEGATIVE   Bilirubin Urine SMALL (A) NEGATIVE   Ketones, ur 15 (A) NEGATIVE mg/dL   Protein, ur NEGATIVE NEGATIVE mg/dL   Urobilinogen, UA 0.2 0.0 - 1.0 mg/dL   Nitrite NEGATIVE NEGATIVE   Leukocytes, UA NEGATIVE NEGATIVE    Comment: MICROSCOPIC NOT DONE ON URINES WITH NEGATIVE PROTEIN, BLOOD, LEUKOCYTES, NITRITE, OR GLUCOSE <1000 mg/dL.  Urine rapid drug screen (hosp performed)     Status: Abnormal   Collection Time: 09/03/15 12:59 AM  Result Value Ref Range   Opiates NONE DETECTED NONE DETECTED   Cocaine NONE DETECTED NONE DETECTED   Benzodiazepines POSITIVE (A) NONE DETECTED   Amphetamines NONE DETECTED NONE DETECTED   Tetrahydrocannabinol NONE DETECTED NONE DETECTED   Barbiturates POSITIVE (A) NONE DETECTED    Comment:        DRUG SCREEN FOR MEDICAL PURPOSES ONLY.  IF CONFIRMATION IS NEEDED FOR ANY PURPOSE, NOTIFY LAB WITHIN 5 DAYS.        LOWEST DETECTABLE LIMITS FOR URINE DRUG SCREEN Drug Class       Cutoff (ng/mL) Amphetamine      1000 Barbiturate      200 Benzodiazepine   841 Tricyclics       660 Opiates          300 Cocaine          300 THC              50     Assessment/Plan: Acute post-traumatic headache, not intractable IM Toradol given with only minimal relief in symptoms. Rx Butalbital for use at home to relieve pain. Giving severity of symptoms and recent injury along with severe depression and SI with intent, patient sent to ER for further assessment and  management.  Suicidal ideation With plan to harm herself. Also with need for ER workup for severe headache s/p trauma. Discussed case with ER MD, Dr. Maryan Rued who is aware of patient's intent to harm herself to make sure this is addressed and inpatient management is obtained. Patient taken to ER via wheelchair by myself.

## 2015-09-07 DIAGNOSIS — I313 Pericardial effusion (noninflammatory): Secondary | ICD-10-CM | POA: Insufficient documentation

## 2015-09-07 DIAGNOSIS — I3139 Other pericardial effusion (noninflammatory): Secondary | ICD-10-CM | POA: Insufficient documentation

## 2015-09-13 NOTE — ED Notes (Signed)
Call received from Stephanie Stephanie Mckenzie who stated she could not find her medication when discharged from [place] where she was transferred to from [facility].  Conversation with staff member who took care of Stephanie Mckenzie on the date she was transfered stated that her medications were sent home with her son. This information was passed on to Stephanie Mckenzie who stated her son had not mentioned he had her medication.

## 2015-09-22 ENCOUNTER — Other Ambulatory Visit: Payer: Self-pay

## 2015-09-22 ENCOUNTER — Ambulatory Visit (INDEPENDENT_AMBULATORY_CARE_PROVIDER_SITE_OTHER): Payer: PPO | Admitting: Family Medicine

## 2015-09-22 ENCOUNTER — Encounter: Payer: Self-pay | Admitting: Family Medicine

## 2015-09-22 VITALS — BP 145/82 | HR 116 | Temp 98.0°F | Wt 225.0 lb

## 2015-09-22 DIAGNOSIS — R51 Headache: Secondary | ICD-10-CM

## 2015-09-22 DIAGNOSIS — R296 Repeated falls: Secondary | ICD-10-CM | POA: Diagnosis not present

## 2015-09-22 DIAGNOSIS — K589 Irritable bowel syndrome without diarrhea: Secondary | ICD-10-CM

## 2015-09-22 DIAGNOSIS — K219 Gastro-esophageal reflux disease without esophagitis: Secondary | ICD-10-CM | POA: Diagnosis not present

## 2015-09-22 DIAGNOSIS — F329 Major depressive disorder, single episode, unspecified: Secondary | ICD-10-CM

## 2015-09-22 DIAGNOSIS — R413 Other amnesia: Secondary | ICD-10-CM | POA: Diagnosis not present

## 2015-09-22 DIAGNOSIS — R519 Headache, unspecified: Secondary | ICD-10-CM

## 2015-09-22 DIAGNOSIS — F32A Depression, unspecified: Secondary | ICD-10-CM

## 2015-09-22 DIAGNOSIS — R197 Diarrhea, unspecified: Secondary | ICD-10-CM

## 2015-09-22 MED ORDER — ARIPIPRAZOLE 5 MG PO TABS: 5.0000 mg | ORAL_TABLET | Freq: Every day | ORAL | Status: DC

## 2015-09-22 MED ORDER — DIPHENOXYLATE-ATROPINE 2.5-0.025 MG PO TABS: 1.0000 | ORAL_TABLET | Freq: Four times a day (QID) | ORAL | Status: DC | PRN

## 2015-09-22 MED ORDER — BUTALBITAL-APAP-CAFFEINE 50-325-40 MG PO TABS: 1.0000 | ORAL_TABLET | Freq: Four times a day (QID) | ORAL | Status: DC | PRN

## 2015-09-22 MED ORDER — ARIPIPRAZOLE 15 MG PO TABS: 15.0000 mg | ORAL_TABLET | Freq: Every day | ORAL | Status: DC

## 2015-09-22 MED ORDER — OMEPRAZOLE 40 MG PO CPDR: DELAYED_RELEASE_CAPSULE | ORAL | Status: DC

## 2015-09-22 NOTE — Telephone Encounter (Signed)
Patient came into clinic her Surorexant 10 MG was never received at the pharmacy so she needs that medication sent so she can begin taking the medication. Please send order to pharmacy

## 2015-09-22 NOTE — Patient Instructions (Signed)
Fall Prevention in the Home  Falls can cause injuries and can affect people from all age groups. There are many simple things that you can do to make your home safe and to help prevent falls. WHAT CAN I DO ON THE OUTSIDE OF MY HOME?  Regularly repair the edges of walkways and driveways and fix any cracks.  Remove high doorway thresholds.  Trim any shrubbery on the main path into your home.  Use bright outdoor lighting.  Clear walkways of debris and clutter, including tools and rocks.  Regularly check that handrails are securely fastened and in good repair. Both sides of any steps should have handrails.  Install guardrails along the edges of any raised decks or porches.  Have leaves, snow, and ice cleared regularly.  Use sand or salt on walkways during winter months.  In the garage, clean up any spills right away, including grease or oil spills. WHAT CAN I DO IN THE BATHROOM?  Use night lights.  Install grab bars by the toilet and in the tub and shower. Do not use towel bars as grab bars.  Use non-skid mats or decals on the floor of the tub or shower.  If you need to sit down while you are in the shower, use a plastic, non-slip stool..  Keep the floor dry. Immediately clean up any water that spills on the floor.  Remove soap buildup in the tub or shower on a regular basis.  Attach bath mats securely with double-sided non-slip rug tape.  Remove throw rugs and other tripping hazards from the floor. WHAT CAN I DO IN THE BEDROOM?  Use night lights.  Make sure that a bedside light is easy to reach.  Do not use oversized bedding that drapes onto the floor.  Have a firm chair that has side arms to use for getting dressed.  Remove throw rugs and other tripping hazards from the floor. WHAT CAN I DO IN THE KITCHEN?   Clean up any spills right away.  Avoid walking on wet floors.  Place frequently used items in easy-to-reach places.  If you need to reach for something  above you, use a sturdy step stool that has a grab bar.  Keep electrical cables out of the way.  Do not use floor polish or wax that makes floors slippery. If you have to use wax, make sure that it is non-skid floor wax.  Remove throw rugs and other tripping hazards from the floor. WHAT CAN I DO IN THE STAIRWAYS?  Do not leave any items on the stairs.  Make sure that there are handrails on both sides of the stairs. Fix handrails that are broken or loose. Make sure that handrails are as long as the stairways.  Check any carpeting to make sure that it is firmly attached to the stairs. Fix any carpet that is loose or worn.  Avoid having throw rugs at the top or bottom of stairways, or secure the rugs with carpet tape to prevent them from moving.  Make sure that you have a light switch at the top of the stairs and the bottom of the stairs. If you do not have them, have them installed. WHAT ARE SOME OTHER FALL PREVENTION TIPS?  Wear closed-toe shoes that fit well and support your feet. Wear shoes that have rubber soles or low heels.  When you use a stepladder, make sure that it is completely opened and that the sides are firmly locked. Have someone hold the ladder while you   are using it. Do not climb a closed stepladder.  Add color or contrast paint or tape to grab bars and handrails in your home. Place contrasting color strips on the first and last steps.  Use mobility aids as needed, such as canes, walkers, scooters, and crutches.  Turn on lights if it is dark. Replace any light bulbs that burn out.  Set up furniture so that there are clear paths. Keep the furniture in the same spot.  Fix any uneven floor surfaces.  Choose a carpet design that does not hide the edge of steps of a stairway.  Be aware of any and all pets.  Review your medicines with your healthcare provider. Some medicines can cause dizziness or changes in blood pressure, which increase your risk of falling. Talk  with your health care provider about other ways that you can decrease your risk of falls. This may include working with a physical therapist or trainer to improve your strength, balance, and endurance.   This information is not intended to replace advice given to you by your health care provider. Make sure you discuss any questions you have with your health care provider.   Document Released: 11/02/2002 Document Revised: 03/29/2015 Document Reviewed: 12/17/2014 Elsevier Interactive Patient Education 2016 [employer] Inc.  

## 2015-09-22 NOTE — Progress Notes (Signed)
Patient ID: Stephanie Mckenzie, female    DOB: [birth year]  Age: 74 y.o. MRN: [id number]    Subjective:  Subjective HPI Stephanie Mckenzie presents for f/u er for head injury, headaches and depression.   Pt was in [place] and was put on abilify about 1 week ago.  She has appointment with psych next week and needs f/u neuro.  She c/o diarrhea that is frequent.   She was on lialda in past and "someone" stopped it-- no bleedeing.  No n/v -- + gerd  Review of Systems  Constitutional: Negative for diaphoresis, appetite change, fatigue and unexpected weight change.  Eyes: Negative for pain, redness and visual disturbance.  Respiratory: Negative for cough, chest tightness, shortness of breath and wheezing.   Cardiovascular: Positive for leg swelling. Negative for chest pain and palpitations.  Gastrointestinal: Positive for abdominal pain and diarrhea. Negative for nausea, constipation and blood in stool.  Endocrine: Negative for cold intolerance, heat intolerance, polydipsia, polyphagia and polyuria.  Genitourinary: Negative for dysuria, frequency and difficulty urinating.  Neurological: Positive for weakness and headaches. Negative for dizziness, light-headedness and numbness.  Psychiatric/Behavioral: Positive for dysphoric mood and agitation. The patient is nervous/anxious.     History Past Medical History  Diagnosis Date  . Hypertension   . Migraine   . Depression     She has past surgical history that includes Cesarean section; Appendectomy; Parathyroidectomy; Laminectomy; Coronary angioplasty; and Abdominal hysterectomy.   Her family history includes Diabetes in her mother; Hypertension in her mother; Thyroid disease in her mother.She reports that she has quit smoking. Her smoking use included Cigarettes. She has never used smokeless tobacco. She reports that she drinks alcohol. She reports that she does not use illicit drugs.  Current Outpatient Prescriptions on File Prior to Visit    Medication Sig Dispense Refill  . atorvastatin (LIPITOR) 40 MG tablet Take 1 tablet (40 mg total) by mouth daily. 90 tablet 1  . gabapentin (NEURONTIN) 300 MG capsule Take 1 capsule (300 mg total) by mouth 2 (two) times daily. At breakfast and then at lunch (Patient taking differently: Take 300 mg by mouth 2 (two) times daily. 2 during the day and 3 at night) 60 capsule 5  . metoprolol (LOPRESSOR) 50 MG tablet Take 50 mg by mouth 2 (two) times daily. Patient only takes once daily    . traZODone (DESYREL) 50 MG tablet Take 1 tablet (50 mg total) by mouth at bedtime.    . vitamin B-12 (CYANOCOBALAMIN) 1000 MCG tablet Take 1,000 mcg by mouth daily.    Marland Kitchen FLUoxetine (PROZAC) 40 MG capsule Take 40 mg by mouth daily.    . Suvorexant (BELSOMRA) 10 MG TABS Take 10 mg by mouth at bedtime. (Patient not taking: Reported on 09/22/2015) 10 tablet 0  . torsemide (DEMADEX) 20 MG tablet 2 po qd (Patient not taking: Reported on 09/22/2015) 60 tablet 2   No current facility-administered medications on file prior to visit.     Objective:  Objective Physical Exam  Constitutional: She is oriented to person, place, and time. She appears well-developed and well-nourished.  HENT:  Head: Normocephalic and atraumatic.  Eyes: Conjunctivae and EOM are normal.  Neck: Normal range of motion. Neck supple. No JVD present. Carotid bruit is not present. No thyromegaly present.  Cardiovascular: Normal rate, regular rhythm and normal heart sounds.   No murmur heard. Pulmonary/Chest: Effort normal and breath sounds normal. No respiratory distress. She has no wheezes. She has no rales. She exhibits no tenderness.  Musculoskeletal: She exhibits no edema.  Neurological: She is alert and oriented to person, place, and time.  Psychiatric: Thought content normal. Her mood appears anxious. Cognition and memory are impaired. She exhibits a depressed mood.   BP 145/82 mmHg  Pulse 116  Temp(Src) 98 F (36.7 C) (Oral)  Wt 225 lb  (102.059 kg)  SpO2 98% Wt Readings from Last 3 Encounters:  09/22/15 225 lb (102.059 kg)  09/02/15 207 lb (93.895 kg)  08/31/15 207 lb (93.895 kg)     Lab Results  Component Value Date   WBC 5.5 09/02/2015   HGB 11.6* 09/02/2015   HCT 37.1 09/02/2015   PLT 205 09/02/2015   GLUCOSE 88 09/02/2015   CHOL 165 05/26/2015   TRIG 192.0* 05/26/2015   HDL 43.80 05/26/2015   LDLCALC 83 05/26/2015   ALT 15 05/26/2015   AST 14 05/26/2015   NA 139 09/02/2015   K 4.6 09/02/2015   CL 110 09/02/2015   CREATININE 0.84 09/02/2015   BUN 15 09/02/2015   CO2 25 09/02/2015   TSH 1.94 05/26/2015   INR 0.88 12/27/2009    Dg Chest 2 View  09/02/2015  CLINICAL DATA:  Patient status post fall. Hit tub. No chest complaints. EXAM: CHEST  2 VIEW COMPARISON:  Chest radiograph 05/12/2015 FINDINGS: Stable cardiac and mediastinal contours. No consolidative pulmonary opacities. No pleural effusion or pneumothorax. Mid thoracic spine degenerative changes. IMPRESSION: No acute cardiopulmonary process. Electronically Signed   By: Lovey Newcomer M.D.   On: 09/02/2015 21:31     Assessment & Plan:  Plan I have discontinued Ms. Caputi's aspirin, LORazepam, and Vortioxetine HBr. I have also changed her omeprazole and diphenoxylate-atropine. Additionally, I am having her start on ARIPiprazole. Lastly, I am having her maintain her vitamin B-12, metoprolol, torsemide, FLUoxetine, atorvastatin, gabapentin, traZODone, Suvorexant, ALPRAZolam, amLODipine, cholecalciferol, folic acid, mirtazapine, and butalbital-acetaminophen-caffeine.  Meds ordered this encounter  Medications  . ALPRAZolam (XANAX) 0.5 MG tablet    Sig: Take 0.5 mg by mouth.  Marland Kitchen amLODipine (NORVASC) 10 MG tablet    Sig: Take 10 mg by mouth.  . cholecalciferol (VITAMIN D) 1000 UNITS tablet    Sig: Take 2,000 Units by mouth.  . folic acid (FOLVITE) 1 MG tablet    Sig: Take 1 mg by mouth.  . mirtazapine (REMERON) 45 MG tablet    Sig: Take 45 mg by mouth.    . ARIPiprazole (ABILIFY) 5 MG tablet    Sig: Take 1 tablet (5 mg total) by mouth daily.  Marland Kitchen omeprazole (PRILOSEC) 40 MG capsule    Sig: 1 po bid    Dispense:  60 capsule    Refill:  5  . butalbital-acetaminophen-caffeine (FIORICET) 50-325-40 MG tablet    Sig: Take 1 tablet by mouth every 6 (six) hours as needed for headache.    Dispense:  20 tablet    Refill:  0  . diphenoxylate-atropine (LOMOTIL) 2.5-0.025 MG tablet    Sig: Take 1 tablet by mouth 4 (four) times daily as needed for diarrhea or loose stools.    Dispense:  30 tablet    Refill:  0    Problem List Items Addressed This Visit    Memory loss   Relevant Orders   Ambulatory referral to Home Health   Depression - Primary   Relevant Medications   ALPRAZolam (XANAX) 0.5 MG tablet   mirtazapine (REMERON) 45 MG tablet   ARIPiprazole (ABILIFY) 5 MG tablet   Other Relevant Orders   Ambulatory referral to  Home Health    Other Visit Diagnoses    Falls frequently        Relevant Orders    Ambulatory referral to Home Health    Gastroesophageal reflux disease, esophagitis presence not specified        Relevant Medications    omeprazole (PRILOSEC) 40 MG capsule    diphenoxylate-atropine (LOMOTIL) 2.5-0.025 MG tablet    Generalized headaches        Relevant Medications    amLODipine (NORVASC) 10 MG tablet    mirtazapine (REMERON) 45 MG tablet    butalbital-acetaminophen-caffeine (FIORICET) 50-325-40 MG tablet    Other Relevant Orders    Ambulatory referral to Neurology    IBS (irritable bowel syndrome)        Relevant Medications    omeprazole (PRILOSEC) 40 MG capsule    diphenoxylate-atropine (LOMOTIL) 2.5-0.025 MG tablet    Diarrhea, unspecified type        Relevant Orders    Ambulatory referral to Gastroenterology       Follow-up: Return in about 6 months (around 03/22/2016), or if symptoms worsen or fail to improve.  Garnet Koyanagi, DO

## 2015-09-22 NOTE — Telephone Encounter (Signed)
Per Dr. Etter Sjogren, Rx called in to pharmacy. Pt was made aware.

## 2015-09-22 NOTE — Progress Notes (Signed)
Pre visit review using our clinic review tool, if applicable. No additional management support is needed unless otherwise documented below in the visit note. 

## 2015-09-26 ENCOUNTER — Telehealth: Payer: Self-pay | Admitting: *Deleted

## 2015-09-26 NOTE — Telephone Encounter (Signed)
PA initiated. Awaiting determination. JG//CMA 

## 2015-09-27 NOTE — Telephone Encounter (Signed)
Approved.  

## 2015-09-28 ENCOUNTER — Telehealth: Payer: Self-pay | Admitting: Family Medicine

## 2015-09-28 NOTE — Telephone Encounter (Signed)
Caller name: [name] Physical Therapist    Can be reached: [phone]   Reason for call: did evaluation for home physical therapy today, she plan to see pt twice a week starting next week.

## 2015-09-29 ENCOUNTER — Telehealth: Payer: Self-pay | Admitting: Family Medicine

## 2015-09-29 NOTE — Telephone Encounter (Signed)
Seen 09/22/15. Please advise if refills appropriate. Amlodipine also causing swelling, please advise       KP

## 2015-09-29 NOTE — Telephone Encounter (Signed)
Pharmacy: [facility] on [address]  Reason for call: Pt called for med refills on below. Has concerns about legs swelling again since she started taking amlodipine.  Amlodipine 10mg  - 2 weeks on hand - causing swelling in legs again Remeron 45mg  - 1 week on hand Folic acid 1mg  - 1 week on hand Suvorexant 10mg  - 1 week on hand

## 2015-09-29 NOTE — Telephone Encounter (Signed)
Is she needs a verbal to do the PT?

## 2015-09-29 NOTE — Telephone Encounter (Signed)
Ok to give verbal 

## 2015-09-29 NOTE — Telephone Encounter (Signed)
It does not look like she has tried cozaar 100 mg 1 po qd,  #30  2 refills  D/c norvasc

## 2015-09-30 ENCOUNTER — Encounter: Payer: Self-pay | Admitting: Family Medicine

## 2015-09-30 ENCOUNTER — Ambulatory Visit (INDEPENDENT_AMBULATORY_CARE_PROVIDER_SITE_OTHER): Payer: PPO | Admitting: Family Medicine

## 2015-09-30 VITALS — BP 142/86 | HR 105 | Temp 98.3°F | Ht 64.0 in | Wt 224.4 lb

## 2015-09-30 DIAGNOSIS — I10 Essential (primary) hypertension: Secondary | ICD-10-CM | POA: Diagnosis not present

## 2015-09-30 DIAGNOSIS — K51819 Other ulcerative colitis with unspecified complications: Secondary | ICD-10-CM

## 2015-09-30 DIAGNOSIS — K219 Gastro-esophageal reflux disease without esophagitis: Secondary | ICD-10-CM | POA: Diagnosis not present

## 2015-09-30 MED ORDER — MAGIC MOUTHWASH W/LIDOCAINE: 5.0000 mL | Freq: Three times a day (TID) | ORAL | Status: DC | PRN

## 2015-09-30 MED ORDER — METOPROLOL TARTRATE 100 MG PO TABS: 100.0000 mg | ORAL_TABLET | Freq: Two times a day (BID) | ORAL | Status: DC

## 2015-09-30 MED ORDER — LOSARTAN POTASSIUM 100 MG PO TABS: 100.0000 mg | ORAL_TABLET | Freq: Every day | ORAL | Status: DC

## 2015-09-30 MED ORDER — MIRTAZAPINE 45 MG PO TABS: 45.0000 mg | ORAL_TABLET | Freq: Every day | ORAL | Status: DC

## 2015-09-30 MED ORDER — FUROSEMIDE 20 MG PO TABS: 20.0000 mg | ORAL_TABLET | Freq: Every day | ORAL | Status: DC | PRN

## 2015-09-30 MED ORDER — FOLIC ACID 1 MG PO TABS: 1.0000 mg | ORAL_TABLET | Freq: Every day | ORAL | Status: DC

## 2015-09-30 NOTE — Progress Notes (Signed)
Pre visit review using our clinic review tool, if applicable. No additional management support is needed unless otherwise documented below in the visit note. 

## 2015-09-30 NOTE — Telephone Encounter (Signed)
Msg left to call the office     KP 

## 2015-09-30 NOTE — Telephone Encounter (Signed)
Spoke with patient and she stated the Norvasc has her hands swelling and she would like to get something called in for the swelling. Please advise     KP

## 2015-09-30 NOTE — Telephone Encounter (Signed)
Patient is aware and verbalized understanding, she is uncomfortable and the swelling is huge, I advised she needs to be seen. Apt scheduled for today at 3:45.     KP

## 2015-09-30 NOTE — Patient Instructions (Signed)
Hypertension Hypertension, commonly called high blood pressure, is when the force of blood pumping through your arteries is too strong. Your arteries are the blood vessels that carry blood from your heart throughout your body. A blood pressure reading consists of a higher number over a lower number, such as 110/72. The higher number (systolic) is the pressure inside your arteries when your heart pumps. The lower number (diastolic) is the pressure inside your arteries when your heart relaxes. Ideally you want your blood pressure below 120/80. Hypertension forces your heart to work harder to pump blood. Your arteries may become narrow or stiff. Having untreated or uncontrolled hypertension can cause heart attack, stroke, kidney disease, and other problems. RISK FACTORS Some risk factors for high blood pressure are controllable. Others are not.  Risk factors you cannot control include:   Race. You may be at higher risk if you are African American.  Age. Risk increases with age.  Gender. Men are at higher risk than women before age 45 years. After age 65, women are at higher risk than men. Risk factors you can control include:  Not getting enough exercise or physical activity.  Being overweight.  Getting too much fat, sugar, calories, or salt in your diet.  Drinking too much alcohol. SIGNS AND SYMPTOMS Hypertension does not usually cause signs or symptoms. Extremely high blood pressure (hypertensive crisis) may cause headache, anxiety, shortness of breath, and nosebleed. DIAGNOSIS To check if you have hypertension, your health care provider will measure your blood pressure while you are seated, with your arm held at the level of your heart. It should be measured at least twice using the same arm. Certain conditions can cause a difference in blood pressure between your right and left arms. A blood pressure reading that is higher than normal on one occasion does not mean that you need treatment. If  it is not clear whether you have high blood pressure, you may be asked to return on a different day to have your blood pressure checked again. Or, you may be asked to monitor your blood pressure at home for 1 or more weeks. TREATMENT Treating high blood pressure includes making lifestyle changes and possibly taking medicine. Living a healthy lifestyle can help lower high blood pressure. You may need to change some of your habits. Lifestyle changes may include:  Following the DASH diet. This diet is high in fruits, vegetables, and whole grains. It is low in salt, red meat, and added sugars.  Keep your sodium intake below 2,300 mg per day.  Getting at least 30-45 minutes of aerobic exercise at least 4 times per week.  Losing weight if necessary.  Not smoking.  Limiting alcoholic beverages.  Learning ways to reduce stress. Your health care provider may prescribe medicine if lifestyle changes are not enough to get your blood pressure under control, and if one of the following is true:  You are 18-59 years of age and your systolic blood pressure is above 140.  You are 60 years of age or older, and your systolic blood pressure is above 150.  Your diastolic blood pressure is above 90.  You have diabetes, and your systolic blood pressure is over 140 or your diastolic blood pressure is over 90.  You have kidney disease and your blood pressure is above 140/90.  You have heart disease and your blood pressure is above 140/90. Your personal target blood pressure may vary depending on your medical conditions, your age, and other factors. HOME CARE INSTRUCTIONS    Have your blood pressure rechecked as directed by your health care provider.   Take medicines only as directed by your health care provider. Follow the directions carefully. Blood pressure medicines must be taken as prescribed. The medicine does not work as well when you skip doses. Skipping doses also puts you at risk for  problems.  Do not smoke.   Monitor your blood pressure at home as directed by your health care provider. SEEK MEDICAL CARE IF:   You think you are having a reaction to medicines taken.  You have recurrent headaches or feel dizzy.  You have swelling in your ankles.  You have trouble with your vision. SEEK IMMEDIATE MEDICAL CARE IF:  You develop a severe headache or confusion.  You have unusual weakness, numbness, or feel faint.  You have severe chest or abdominal pain.  You vomit repeatedly.  You have trouble breathing. MAKE SURE YOU:   Understand these instructions.  Will watch your condition.  Will get help right away if you are not doing well or get worse.   This information is not intended to replace advice given to you by your health care provider. Make sure you discuss any questions you have with your health care provider.   Document Released: 11/12/2005 Document Revised: 03/29/2015 Document Reviewed: 09/04/2013 Elsevier Interactive Patient Education 2016 Elsevier Inc.  

## 2015-09-30 NOTE — Telephone Encounter (Signed)
The swelling will resolve with elevation of legs, increased water intake, and time.  This does not require a separate medication to treat the swelling.  Dr Etter Sjogren has chronic kidney disease on her problem list so I am not comfortable writing a diuretic for temporary swelling

## 2015-10-09 ENCOUNTER — Encounter: Payer: Self-pay | Admitting: Family Medicine

## 2015-10-09 DIAGNOSIS — R609 Edema, unspecified: Secondary | ICD-10-CM

## 2015-10-09 HISTORY — DX: Edema, unspecified: R60.9

## 2015-10-09 NOTE — Assessment & Plan Note (Signed)
Asymptomatic at this time 

## 2015-10-09 NOTE — Progress Notes (Signed)
Subjective:    Patient ID: Stephanie Mckenzie, female    DOB: 1941-06-23, 74 y.o.   MRN: [id number]  Chief Complaint  Patient presents with  . Edema    HPI Patient is in today for follow-up. Is feeling fairly well. But acknowledges depression continues to be an issue. Has had some recent PPD edema as well. No recent illness, fevers or chills. Denies CP/palp/SOB/HA/congestion/fevers/GI or GU c/o. Taking meds as prescribed  Past Medical History  Diagnosis Date  . Hypertension   . Migraine   . Depression   . Edema 10/09/2015    Past Surgical History  Procedure Laterality Date  . Cesarean section      x 2  . Appendectomy    . Parathyroidectomy    . Laminectomy    . Coronary angioplasty    . Abdominal hysterectomy      Family History  Problem Relation Age of Onset  . Diabetes Mother   . Hypertension Mother   . Thyroid disease Mother     Social History   Social History  . Marital Status: Divorced    Spouse Name: N/A  . Number of Children: 2  . Years of Education: N/A   Occupational History  . Retired    Social History Main Topics  . Smoking status: Former Smoker    Types: Cigarettes  . Smokeless tobacco: Never Used  . Alcohol Use: 0.0 oz/week    0 Standard drinks or equivalent per week     Comment: occasional  . Drug Use: No  . Sexual Activity: Not on file   Other Topics Concern  . Not on file   Social History Narrative    Outpatient Prescriptions Prior to Visit  Medication Sig Dispense Refill  . ALPRAZolam (XANAX) 0.5 MG tablet Take 0.5 mg by mouth.    . ARIPiprazole (ABILIFY) 15 MG tablet Take 1 tablet (15 mg total) by mouth daily. 30 tablet 5  . atorvastatin (LIPITOR) 40 MG tablet Take 1 tablet (40 mg total) by mouth daily. 90 tablet 1  . butalbital-acetaminophen-caffeine (FIORICET) 50-325-40 MG tablet Take 1 tablet by mouth every 6 (six) hours as needed for headache. 20 tablet 0  . cholecalciferol (VITAMIN D) 1000 UNITS tablet Take 2,000 Units by  mouth.    . diphenoxylate-atropine (LOMOTIL) 2.5-0.025 MG tablet Take 1 tablet by mouth 4 (four) times daily as needed for diarrhea or loose stools. 30 tablet 0  . folic acid (FOLVITE) 1 MG tablet Take 1 tablet (1 mg total) by mouth daily. 30 tablet 5  . gabapentin (NEURONTIN) 300 MG capsule Take 1 capsule (300 mg total) by mouth 2 (two) times daily. At breakfast and then at lunch (Patient taking differently: Take 300 mg by mouth 2 (two) times daily. 2 during the day and 3 at night) 60 capsule 5  . losartan (COZAAR) 100 MG tablet Take 1 tablet (100 mg total) by mouth daily. 30 tablet 2  . mirtazapine (REMERON) 45 MG tablet Take 1 tablet (45 mg total) by mouth at bedtime. 30 tablet 0  . omeprazole (PRILOSEC) 40 MG capsule 1 po bid 60 capsule 5  . Suvorexant (BELSOMRA) 10 MG TABS Take 10 mg by mouth at bedtime. 10 tablet 0  . traZODone (DESYREL) 50 MG tablet Take 1 tablet (50 mg total) by mouth at bedtime.    . vitamin B-12 (CYANOCOBALAMIN) 1000 MCG tablet Take 1,000 mcg by mouth daily.    . metoprolol (LOPRESSOR) 50 MG tablet Take 50 mg by  mouth 2 (two) times daily. Patient only takes once daily    . torsemide (DEMADEX) 20 MG tablet 2 po qd (Patient not taking: Reported on 09/30/2015) 60 tablet 2  . FLUoxetine (PROZAC) 40 MG capsule Take 40 mg by mouth daily.     No facility-administered medications prior to visit.    Allergies  Allergen Reactions  . Dilaudid [Hydromorphone] Other (See Comments)    Per daughter cause confusion and hallucinations. Causes confusion and hallucinations per pt's daughter  . Lorazepam Rash and Other (See Comments)    Other reaction(s): Hallucinations hallucinations  hallucinations  hallucinations   . Acyclovir And Related   . Amlodipine Besylate     Pedal edema  . Morphine And Related Itching  . Buprenorphine Hcl Itching    Review of Systems  Constitutional: Negative for fever, chills and malaise/fatigue.  HENT: Negative for congestion and hearing loss.     Eyes: Negative for discharge.  Respiratory: Negative for cough, sputum production and shortness of breath.   Cardiovascular: Positive for leg swelling. Negative for chest pain and palpitations.  Gastrointestinal: Negative for heartburn, nausea, vomiting, abdominal pain, diarrhea, constipation and blood in stool.  Genitourinary: Negative for dysuria, urgency, frequency and hematuria.  Musculoskeletal: Negative for myalgias, back pain and falls.  Skin: Negative for rash.  Neurological: Negative for dizziness, sensory change, loss of consciousness, weakness and headaches.  Endo/Heme/Allergies: Negative for environmental allergies. Does not bruise/bleed easily.  Psychiatric/Behavioral: Negative for depression and suicidal ideas. The patient is not nervous/anxious and does not have insomnia.        Objective:    Physical Exam  Constitutional: She is oriented to person, place, and time. She appears well-developed and well-nourished. No distress.  HENT:  Head: Normocephalic and atraumatic.  Nose: Nose normal.  Eyes: Right eye exhibits no discharge. Left eye exhibits no discharge.  Neck: Normal range of motion. Neck supple.  Cardiovascular: Normal rate and regular rhythm.   No murmur heard. Pulmonary/Chest: Effort normal and breath sounds normal.  Abdominal: Soft. Bowel sounds are normal. There is no tenderness.  Musculoskeletal: She exhibits no edema.  Neurological: She is alert and oriented to person, place, and time.  Skin: Skin is warm and dry.  Psychiatric: She has a normal mood and affect.  Nursing note and vitals reviewed.   BP 142/86 mmHg  Pulse 105  Temp(Src) 98.3 F (36.8 C) (Oral)  Ht 5\' 4"  (1.626 m)  Wt 224 lb 6 oz (101.776 kg)  BMI 38.49 kg/m2  SpO2 93% Wt Readings from Last 3 Encounters:  09/30/15 224 lb 6 oz (101.776 kg)  09/22/15 225 lb (102.059 kg)  09/02/15 207 lb (93.895 kg)     Lab Results  Component Value Date   WBC 5.5 09/02/2015   HGB 11.6*  09/02/2015   HCT 37.1 09/02/2015   PLT 205 09/02/2015   GLUCOSE 88 09/02/2015   CHOL 165 05/26/2015   TRIG 192.0* 05/26/2015   HDL 43.80 05/26/2015   LDLCALC 83 05/26/2015   ALT 15 05/26/2015   AST 14 05/26/2015   NA 139 09/02/2015   K 4.6 09/02/2015   CL 110 09/02/2015   CREATININE 0.84 09/02/2015   BUN 15 09/02/2015   CO2 25 09/02/2015   TSH 1.94 05/26/2015   INR 0.88 12/27/2009    Lab Results  Component Value Date   TSH 1.94 05/26/2015   Lab Results  Component Value Date   WBC 5.5 09/02/2015   HGB 11.6* 09/02/2015   HCT 37.1 09/02/2015  MCV 93.9 09/02/2015   PLT 205 09/02/2015   Lab Results  Component Value Date   NA 139 09/02/2015   K 4.6 09/02/2015   CO2 25 09/02/2015   GLUCOSE 88 09/02/2015   BUN 15 09/02/2015   CREATININE 0.84 09/02/2015   BILITOT 0.2 05/26/2015   ALKPHOS 69 05/26/2015   AST 14 05/26/2015   ALT 15 05/26/2015   PROT 6.6 05/26/2015   ALBUMIN 3.7 05/26/2015   CALCIUM 8.6* 09/02/2015   ANIONGAP 4* 09/02/2015   GFR 49.81* 05/26/2015   Lab Results  Component Value Date   CHOL 165 05/26/2015   Lab Results  Component Value Date   HDL 43.80 05/26/2015   Lab Results  Component Value Date   LDLCALC 83 05/26/2015   Lab Results  Component Value Date   TRIG 192.0* 05/26/2015   Lab Results  Component Value Date   CHOLHDL 4 05/26/2015   No results found for: HGBA1C     Assessment & Plan:   Problem List Items Addressed This Visit    Essential hypertension, benign - Primary    Elevated increase Losartan. Encouraged heart healthy diet such as the DASH diet and exercise as tolerated.       Relevant Medications   metoprolol (LOPRESSOR) 100 MG tablet   furosemide (LASIX) 20 MG tablet   GERD (gastroesophageal reflux disease)    Avoid offending foods, start probiotics. Do not eat large meals in late evening and consider raising head of bed.       Relevant Medications   magic mouthwash w/lidocaine SOLN   Ulcerative colitis ([place])     Asymptomatic at this time         I have discontinued Ms. Bevans's metoprolol and FLUoxetine. I am also having her start on metoprolol, furosemide, and magic mouthwash w/lidocaine. Additionally, I am having her maintain her vitamin B-12, torsemide, atorvastatin, gabapentin, traZODone, Suvorexant, ALPRAZolam, cholecalciferol, omeprazole, butalbital-acetaminophen-caffeine, diphenoxylate-atropine, ARIPiprazole, losartan, mirtazapine, and folic acid.  Meds ordered this encounter  Medications  . metoprolol (LOPRESSOR) 100 MG tablet    Sig: Take 1 tablet (100 mg total) by mouth 2 (two) times daily.    Dispense:  180 tablet    Refill:  3  . furosemide (LASIX) 20 MG tablet    Sig: Take 1 tablet (20 mg total) by mouth daily as needed for edema.    Dispense:  15 tablet    Refill:  0  . magic mouthwash w/lidocaine SOLN    Sig: Take 5 mLs by mouth 3 (three) times daily as needed for mouth pain. Swish and spit    Dispense:  120 mL    Refill:  0     Penni Homans, MD

## 2015-10-09 NOTE — Assessment & Plan Note (Signed)
Avoid offending foods, start probiotics. Do not eat large meals in late evening and consider raising head of bed.  

## 2015-10-09 NOTE — Assessment & Plan Note (Addendum)
Elevated increase Losartan. Encouraged heart healthy diet such as the DASH diet and exercise as tolerated.

## 2015-10-09 NOTE — Assessment & Plan Note (Signed)
Minimize sodium, elevate feet above heart bid, compression hose as needed.

## 2015-10-10 ENCOUNTER — Other Ambulatory Visit: Payer: Self-pay | Admitting: Family Medicine

## 2015-10-11 ENCOUNTER — Ambulatory Visit (INDEPENDENT_AMBULATORY_CARE_PROVIDER_SITE_OTHER): Payer: PPO | Admitting: Family Medicine

## 2015-10-11 ENCOUNTER — Encounter: Payer: Self-pay | Admitting: Family Medicine

## 2015-10-11 VITALS — BP 140/84 | HR 74 | Temp 98.2°F | Wt 226.4 lb

## 2015-10-11 DIAGNOSIS — F329 Major depressive disorder, single episode, unspecified: Secondary | ICD-10-CM | POA: Diagnosis not present

## 2015-10-11 DIAGNOSIS — F32A Depression, unspecified: Secondary | ICD-10-CM

## 2015-10-11 DIAGNOSIS — F418 Other specified anxiety disorders: Secondary | ICD-10-CM

## 2015-10-11 NOTE — Progress Notes (Signed)
Pre visit review using our clinic review tool, if applicable. No additional management support is needed unless otherwise documented below in the visit note. 

## 2015-10-11 NOTE — Assessment & Plan Note (Signed)
con't abilify and xanax F/u psych We will call [facility] and have them meet with pt and family to discuss living will, POA Also discuss possibility of assisted living

## 2015-10-11 NOTE — Progress Notes (Signed)
Patient ID: Stephanie Mckenzie, female    DOB: 05-19-1941  Age: 74 y.o. MRN: [id number]    Subjective:  Subjective HPI Stephanie Mckenzie presents with son and daughter.  She feels terrible and her son and daughter thought she had a stroke because she was slurring her speech.  When they got to her they realized she was taking too much medication --- specifically gabepentin and she was taking several meds for sleep.  They brought all her meds in---- there were many duplicates and even 3-4 bottles of same meds.  Pt getting med from [place] and psych.    Review of Systems  Constitutional: Negative for diaphoresis, appetite change, fatigue and unexpected weight change.  Eyes: Negative for pain, redness and visual disturbance.  Respiratory: Negative for cough, chest tightness, shortness of breath and wheezing.   Cardiovascular: Negative for chest pain, palpitations and leg swelling.  Endocrine: Negative for cold intolerance, heat intolerance, polydipsia, polyphagia and polyuria.  Genitourinary: Negative for dysuria, frequency and difficulty urinating.  Neurological: Negative for dizziness, light-headedness, numbness and headaches.  Psychiatric/Behavioral: Positive for confusion, sleep disturbance and dysphoric mood. Negative for suicidal ideas and self-injury. The patient is nervous/anxious.     History Past Medical History  Diagnosis Date  . Hypertension   . Migraine   . Depression   . Edema 10/09/2015    She has past surgical history that includes Cesarean section; Appendectomy; Parathyroidectomy; Laminectomy; Coronary angioplasty; and Abdominal hysterectomy.   Her family history includes Diabetes in her mother; Hypertension in her mother; Thyroid disease in her mother.She reports that she has quit smoking. Her smoking use included Cigarettes. She has never used smokeless tobacco. She reports that she drinks alcohol. She reports that she does not use illicit drugs.  Current Outpatient Prescriptions on  File Prior to Visit  Medication Sig Dispense Refill  . atorvastatin (LIPITOR) 40 MG tablet Take 1 tablet (40 mg total) by mouth daily. 90 tablet 1  . butalbital-acetaminophen-caffeine (FIORICET) 50-325-40 MG tablet Take 1 tablet by mouth every 6 (six) hours as needed for headache. 20 tablet 0  . cholecalciferol (VITAMIN D) 1000 UNITS tablet Take 2,000 Units by mouth.    . diphenoxylate-atropine (LOMOTIL) 2.5-0.025 MG tablet Take 1 tablet by mouth 4 (four) times daily as needed for diarrhea or loose stools. 30 tablet 0  . furosemide (LASIX) 20 MG tablet TAKE ONE TABLET BY MOUTH ONCE DAILY AS NEEDED EDEMA 15 tablet 0  . losartan (COZAAR) 100 MG tablet Take 1 tablet (100 mg total) by mouth daily. 30 tablet 2  . magic mouthwash w/lidocaine SOLN Take 5 mLs by mouth 3 (three) times daily as needed for mouth pain. Swish and spit 120 mL 0  . metoprolol (LOPRESSOR) 100 MG tablet Take 1 tablet (100 mg total) by mouth 2 (two) times daily. 180 tablet 3  . omeprazole (PRILOSEC) 40 MG capsule 1 po bid 60 capsule 5  . Suvorexant (BELSOMRA) 10 MG TABS Take 10 mg by mouth at bedtime. 10 tablet 0  . torsemide (DEMADEX) 20 MG tablet 2 po qd 60 tablet 2  . vitamin B-12 (CYANOCOBALAMIN) 1000 MCG tablet Take 1,000 mcg by mouth daily.     No current facility-administered medications on file prior to visit.     Objective:  Objective Physical Exam  Constitutional: She appears well-developed and well-nourished.  Psychiatric: Her behavior is normal. Judgment normal. Her mood appears anxious. Her speech is slurred. Thought content is not paranoid and not delusional. Cognition and memory are  normal. She exhibits a depressed mood. She expresses no homicidal and no suicidal ideation. She expresses no suicidal plans and no homicidal plans.  Nursing note and vitals reviewed.  BP 140/84 mmHg  Pulse 74  Temp(Src) 98.2 F (36.8 C) (Oral)  Wt 226 lb 6.4 oz (102.694 kg)  SpO2 93% Wt Readings from Last 3 Encounters:    10/11/15 226 lb 6.4 oz (102.694 kg)  09/30/15 224 lb 6 oz (101.776 kg)  09/22/15 225 lb (102.059 kg)     Lab Results  Component Value Date   WBC 5.5 09/02/2015   HGB 11.6* 09/02/2015   HCT 37.1 09/02/2015   PLT 205 09/02/2015   GLUCOSE 88 09/02/2015   CHOL 165 05/26/2015   TRIG 192.0* 05/26/2015   HDL 43.80 05/26/2015   LDLCALC 83 05/26/2015   ALT 15 05/26/2015   AST 14 05/26/2015   NA 139 09/02/2015   K 4.6 09/02/2015   CL 110 09/02/2015   CREATININE 0.84 09/02/2015   BUN 15 09/02/2015   CO2 25 09/02/2015   TSH 1.94 05/26/2015   INR 0.88 12/27/2009    Dg Chest 2 View  09/02/2015  CLINICAL DATA:  Patient status post fall. Hit tub. No chest complaints. EXAM: CHEST  2 VIEW COMPARISON:  Chest radiograph 05/12/2015 FINDINGS: Stable cardiac and mediastinal contours. No consolidative pulmonary opacities. No pleural effusion or pneumothorax. Mid thoracic spine degenerative changes. IMPRESSION: No acute cardiopulmonary process. Electronically Signed   By: Lovey Newcomer M.D.   On: 09/02/2015 21:31     Assessment & Plan:  Plan I have discontinued Ms. Slinker's traZODone. I have also changed her ARIPiprazole and mirtazapine. Additionally, I am having her maintain her vitamin B-12, torsemide, atorvastatin, Suvorexant, cholecalciferol, omeprazole, butalbital-acetaminophen-caffeine, diphenoxylate-atropine, losartan, metoprolol, magic mouthwash w/lidocaine, furosemide, gabapentin, traMADol, ALPRAZolam, and folic acid.  Meds ordered this encounter  Medications  . gabapentin (NEURONTIN) 400 MG capsule    Sig: Take 1,200 mg by mouth.  . traMADol (ULTRAM) 50 MG tablet    Sig: Take 50 mg by mouth every 6 (six) hours as needed.  Marland Kitchen DISCONTD: ALPRAZolam (XANAX) 0.5 MG tablet    Sig: Take 1 tablet (0.5 mg total) by mouth. Dr Micheal Likens  . ARIPiprazole (ABILIFY) 15 MG tablet    Sig: Take 1 tablet (15 mg total) by mouth daily. Dr Josetta Huddle    Dispense:  30 tablet    Refill:  5  . ALPRAZolam  (XANAX) 0.5 MG tablet    Sig: Take 1 tablet (0.5 mg total) by mouth. Dr Micheal Likens  . mirtazapine (REMERON) 45 MG tablet    Sig: Take 1 tablet (45 mg total) by mouth at bedtime. Cvejin-psych    Dispense:  30 tablet    Refill:  0  . folic acid (FOLVITE) 1 MG tablet    Sig: Take 1 tablet (1 mg total) by mouth daily.    Dispense:  30 tablet    Refill:  5    Cvejin--psych    Problem List Items Addressed This Visit    Depression   Relevant Medications   ARIPiprazole (ABILIFY) 15 MG tablet   ALPRAZolam (XANAX) 0.5 MG tablet   mirtazapine (REMERON) 45 MG tablet    Other Visit Diagnoses    Depression with anxiety    -  Primary    Relevant Medications    gabapentin (NEURONTIN) 400 MG capsule    traMADol (ULTRAM) 50 MG tablet    ALPRAZolam (XANAX) 0.5 MG tablet    mirtazapine (REMERON) 45 MG  tablet       Follow-up: Return in about 1 month (around 11/10/2015), or if symptoms worsen or fail to improve.  Garnet Koyanagi, DO

## 2015-10-11 NOTE — Patient Instructions (Signed)
We will wean down the neurontin --- con't 300 mg am and pm and 2 400 mg in PM X 1 week then drop down to 1 400 mg at night and cont the 300 mg in am and pm  X 1 week --- then we will change to 300 mg 3 x a day F/u 1 month

## 2015-10-12 ENCOUNTER — Telehealth: Payer: Self-pay

## 2015-10-12 ENCOUNTER — Other Ambulatory Visit: Payer: Self-pay

## 2015-10-12 DIAGNOSIS — I10 Essential (primary) hypertension: Secondary | ICD-10-CM

## 2015-10-12 MED ORDER — TORSEMIDE 20 MG PO TABS: ORAL_TABLET | ORAL | Status: DC

## 2015-10-12 NOTE — Telephone Encounter (Signed)
Call from the patient and she stated she was unable to sleep last night and she wants you to give her something to help her sleep. I reviewed the medication list with her and advised she has sleep med's listed and she said it did not help her sleep. Please advise      KP

## 2015-10-13 ENCOUNTER — Telehealth: Payer: Self-pay | Admitting: *Deleted

## 2015-10-13 DIAGNOSIS — R2689 Other abnormalities of gait and mobility: Secondary | ICD-10-CM | POA: Diagnosis not present

## 2015-10-13 DIAGNOSIS — R413 Other amnesia: Secondary | ICD-10-CM | POA: Diagnosis not present

## 2015-10-13 DIAGNOSIS — I10 Essential (primary) hypertension: Secondary | ICD-10-CM | POA: Diagnosis not present

## 2015-10-13 DIAGNOSIS — F329 Major depressive disorder, single episode, unspecified: Secondary | ICD-10-CM | POA: Diagnosis not present

## 2015-10-13 NOTE — Telephone Encounter (Signed)
Patient has been made aware and she stated talking about her hear medications and Dr.Lowne taking her medication away, I made her aware we had not taken any of that away, she said she needs Losartan and Torsemide and I told her the other day that they were sent and she does not know where to pick them up, I made her aware that she can get them from [facility] as requested and she verbalized understanding, she has agreed to see psych to get something to help her sleep.  KP

## 2015-10-13 NOTE — Telephone Encounter (Signed)
MSG left to call on VM.     KP

## 2015-10-13 NOTE — Telephone Encounter (Signed)
Forwarded to Dr. Etter Sjogren for review/completion. JG//CMA

## 2015-10-13 NOTE — Telephone Encounter (Signed)
She has too much medicataion and we can not give her more----she can discuss with psych but she needs to bring ALL her meds with her and one of her children should go with her

## 2015-10-14 NOTE — Telephone Encounter (Signed)
Faxed to [facility] successfully. Sent for scanning. JG//CMA  

## 2015-10-25 ENCOUNTER — Ambulatory Visit: Payer: Self-pay | Admitting: Physician Assistant

## 2015-11-15 ENCOUNTER — Encounter: Payer: Self-pay | Admitting: Family Medicine

## 2015-11-15 ENCOUNTER — Ambulatory Visit (INDEPENDENT_AMBULATORY_CARE_PROVIDER_SITE_OTHER): Payer: PPO | Admitting: Family Medicine

## 2015-11-15 VITALS — BP 136/74 | HR 82 | Temp 98.5°F | Ht 64.0 in | Wt 219.0 lb

## 2015-11-15 DIAGNOSIS — R739 Hyperglycemia, unspecified: Secondary | ICD-10-CM | POA: Diagnosis not present

## 2015-11-15 DIAGNOSIS — G894 Chronic pain syndrome: Secondary | ICD-10-CM | POA: Diagnosis not present

## 2015-11-15 DIAGNOSIS — E785 Hyperlipidemia, unspecified: Secondary | ICD-10-CM

## 2015-11-15 DIAGNOSIS — I10 Essential (primary) hypertension: Secondary | ICD-10-CM | POA: Diagnosis not present

## 2015-11-15 DIAGNOSIS — F32A Depression, unspecified: Secondary | ICD-10-CM

## 2015-11-15 DIAGNOSIS — F329 Major depressive disorder, single episode, unspecified: Secondary | ICD-10-CM

## 2015-11-15 DIAGNOSIS — E1165 Type 2 diabetes mellitus with hyperglycemia: Secondary | ICD-10-CM

## 2015-11-15 DIAGNOSIS — E1151 Type 2 diabetes mellitus with diabetic peripheral angiopathy without gangrene: Secondary | ICD-10-CM

## 2015-11-15 DIAGNOSIS — IMO0002 Reserved for concepts with insufficient information to code with codable children: Secondary | ICD-10-CM

## 2015-11-15 LAB — COMPREHENSIVE METABOLIC PANEL
ALK PHOS: 88 U/L (ref 39–117)
ALT: 18 U/L (ref 0–35)
AST: 12 U/L (ref 0–37)
Albumin: 3.6 g/dL (ref 3.5–5.2)
BILIRUBIN TOTAL: 0.2 mg/dL (ref 0.2–1.2)
BUN: 44 mg/dL — ABNORMAL HIGH (ref 6–23)
CO2: 30 meq/L (ref 19–32)
CREATININE: 1.56 mg/dL — AB (ref 0.40–1.20)
Calcium: 9.9 mg/dL (ref 8.4–10.5)
Chloride: 105 mEq/L (ref 96–112)
GFR: 34.64 mL/min — ABNORMAL LOW (ref >60.00)
Glucose, Bld: 104 mg/dL — ABNORMAL HIGH (ref 70–99)
Potassium: 4.3 mEq/L (ref 3.5–5.1)
SODIUM: 143 meq/L (ref 135–145)
Total Protein: 6.7 g/dL (ref 6.0–8.3)

## 2015-11-15 LAB — LIPID PANEL
CHOLESTEROL: 182 mg/dL (ref 0–200)
HDL: 39.1 mg/dL (ref >39.00)
NONHDL: 142.71
TRIGLYCERIDES: 268 mg/dL — AB (ref 0.0–149.0)
Total CHOL/HDL Ratio: 5
VLDL: 53.6 mg/dL — ABNORMAL HIGH (ref 0.0–40.0)

## 2015-11-15 LAB — HEMOGLOBIN A1C: Hgb A1c MFr Bld: 6.5 % (ref 4.6–6.5)

## 2015-11-15 LAB — LDL CHOLESTEROL, DIRECT: Direct LDL: 102 mg/dL

## 2015-11-15 NOTE — Progress Notes (Signed)
Pre visit review using our clinic review tool, if applicable. No additional management support is needed unless otherwise documented below in the visit note. 

## 2015-11-15 NOTE — Progress Notes (Signed)
Patient ID: Stephanie Mckenzie, female    DOB: 18-Nov-1941  Age: 74 y.o. MRN: [id number]    Subjective:  Subjective HPI Stephanie Mckenzie presents for f/u hospital for pneumonia.  She was just d/c from rehab for pneumonia.  Pt did not need abx for home.   Her meds changed some while in hospital.   Hospital meds reviewed.  Review of Systems  Constitutional: Negative for diaphoresis, appetite change, fatigue and unexpected weight change.  Eyes: Negative for pain, redness and visual disturbance.  Respiratory: Negative for cough, chest tightness, shortness of breath and wheezing.   Cardiovascular: Negative for chest pain, palpitations and leg swelling.  Endocrine: Negative for cold intolerance, heat intolerance, polydipsia, polyphagia and polyuria.  Genitourinary: Negative for dysuria, frequency and difficulty urinating.  Neurological: Negative for dizziness, light-headedness, numbness and headaches.    History Past Medical History  Diagnosis Date  . Hypertension   . Migraine   . Depression   . Edema 10/09/2015    She has past surgical history that includes Cesarean section; Appendectomy; Parathyroidectomy; Laminectomy; Coronary angioplasty; and Abdominal hysterectomy.   Her family history includes Diabetes in her mother; Hypertension in her mother; Thyroid disease in her mother.She reports that she has quit smoking. Her smoking use included Cigarettes. She has never used smokeless tobacco. She reports that she drinks alcohol. She reports that she does not use illicit drugs.  Current Outpatient Prescriptions on File Prior to Visit  Medication Sig Dispense Refill  . ALPRAZolam (XANAX) 0.5 MG tablet Take 1 tablet (0.5 mg total) by mouth. Dr Micheal Likens    . ARIPiprazole (ABILIFY) 15 MG tablet Take 1 tablet (15 mg total) by mouth daily. Dr Josetta Huddle 30 tablet 5  . atorvastatin (LIPITOR) 40 MG tablet Take 1 tablet (40 mg total) by mouth daily. 90 tablet 1  . butalbital-acetaminophen-caffeine  (FIORICET) 50-325-40 MG tablet Take 1 tablet by mouth every 6 (six) hours as needed for headache. 20 tablet 0  . cholecalciferol (VITAMIN D) 1000 UNITS tablet Take 2,000 Units by mouth.    . diphenoxylate-atropine (LOMOTIL) 2.5-0.025 MG tablet Take 1 tablet by mouth 4 (four) times daily as needed for diarrhea or loose stools. 30 tablet 0  . folic acid (FOLVITE) 1 MG tablet Take 1 tablet (1 mg total) by mouth daily. 30 tablet 5  . losartan (COZAAR) 100 MG tablet Take 1 tablet (100 mg total) by mouth daily. 30 tablet 2  . magic mouthwash w/lidocaine SOLN Take 5 mLs by mouth 3 (three) times daily as needed for mouth pain. Swish and spit 120 mL 0  . mirtazapine (REMERON) 45 MG tablet Take 1 tablet (45 mg total) by mouth at bedtime. Cvejin-psych 30 tablet 0  . omeprazole (PRILOSEC) 40 MG capsule 1 po bid 60 capsule 5  . torsemide (DEMADEX) 20 MG tablet 2 po qd 60 tablet 2  . traMADol (ULTRAM) 50 MG tablet Take 50 mg by mouth every 6 (six) hours as needed.    . vitamin B-12 (CYANOCOBALAMIN) 1000 MCG tablet Take 1,000 mcg by mouth daily.    . furosemide (LASIX) 20 MG tablet TAKE ONE TABLET BY MOUTH ONCE DAILY AS NEEDED EDEMA (Patient not taking: Reported on 11/15/2015) 15 tablet 0   No current facility-administered medications on file prior to visit.     Objective:  Objective Physical Exam  Constitutional: She is oriented to person, place, and time. She appears well-developed and well-nourished.  HENT:  Head: Normocephalic and atraumatic.  Eyes: Conjunctivae and EOM are normal.  Neck: Normal range of motion. Neck supple. No JVD present. Carotid bruit is not present. No thyromegaly present.  Cardiovascular: Normal rate, regular rhythm and normal heart sounds.   No murmur heard. Pulmonary/Chest: Effort normal and breath sounds normal. No respiratory distress. She has no wheezes. She has no rales. She exhibits no tenderness.  Musculoskeletal: She exhibits no edema.  Neurological: She is alert and  oriented to person, place, and time.  Psychiatric: She has a normal mood and affect. Her behavior is normal. Judgment and thought content normal.  Nursing note and vitals reviewed.  BP 136/74 mmHg  Pulse 82  Temp(Src) 98.5 F (36.9 C) (Oral)  Ht 5' 4"  (1.626 m)  Wt 219 lb (99.338 kg)  BMI 37.57 kg/m2  SpO2 97% Wt Readings from Last 3 Encounters:  11/15/15 219 lb (99.338 kg)  10/11/15 226 lb 6.4 oz (102.694 kg)  09/30/15 224 lb 6 oz (101.776 kg)     Lab Results  Component Value Date   WBC 5.5 09/02/2015   HGB 11.6* 09/02/2015   HCT 37.1 09/02/2015   PLT 205 09/02/2015   GLUCOSE 104* 11/15/2015   CHOL 182 11/15/2015   TRIG 268.0* 11/15/2015   HDL 39.10 11/15/2015   LDLDIRECT 102.0 11/15/2015   LDLCALC 83 05/26/2015   ALT 18 11/15/2015   AST 12 11/15/2015   NA 143 11/15/2015   K 4.3 11/15/2015   CL 105 11/15/2015   CREATININE 1.56* 11/15/2015   BUN 44* 11/15/2015   CO2 30 11/15/2015   TSH 1.94 05/26/2015   INR 0.88 12/27/2009   HGBA1C 6.5 11/15/2015    Dg Chest 2 View  09/02/2015  CLINICAL DATA:  Patient status post fall. Hit tub. No chest complaints. EXAM: CHEST  2 VIEW COMPARISON:  Chest radiograph 05/12/2015 FINDINGS: Stable cardiac and mediastinal contours. No consolidative pulmonary opacities. No pleural effusion or pneumothorax. Mid thoracic spine degenerative changes. IMPRESSION: No acute cardiopulmonary process. Electronically Signed   By: Lovey Newcomer M.D.   On: 09/02/2015 21:31     Assessment & Plan:  Plan I have discontinued Stephanie Mckenzie's Suvorexant, metoprolol, and Suvorexant. I am also having her maintain her vitamin B-12, atorvastatin, cholecalciferol, omeprazole, butalbital-acetaminophen-caffeine, diphenoxylate-atropine, losartan, magic mouthwash w/lidocaine, furosemide, traMADol, ARIPiprazole, ALPRAZolam, mirtazapine, folic acid, torsemide, metoprolol succinate, diltiazem, gabapentin, FLUoxetine, metFORMIN, and glipiZIDE.  Meds ordered this encounter    Medications  . metoprolol succinate (TOPROL XL) 50 MG 24 hr tablet    Sig: Take 50 mg by mouth 2 (two) times daily.  Marland Kitchen DISCONTD: Suvorexant (BELSOMRA) 15 MG TABS    Sig: Take 15 mg by mouth at bedtime.  Marland Kitchen diltiazem (CARDIZEM CD) 180 MG 24 hr capsule    Sig: Take 180 mg by mouth daily.  Marland Kitchen gabapentin (NEURONTIN) 300 MG capsule    Sig: Take 300 mg by mouth 3 (three) times daily.  Marland Kitchen FLUoxetine (PROZAC) 40 MG capsule    Sig: Take 1 capsule by mouth daily.  . metFORMIN (GLUCOPHAGE) 500 MG tablet    Sig: Take 500 mg by mouth 2 (two) times daily with a meal.  . glipiZIDE (GLUCOTROL) 5 MG tablet    Sig: Take 5 mg by mouth daily before breakfast.    Problem List Items Addressed This Visit      Unprioritized   Hyperglycemia - Primary    Check labs Pt was told she was dm in hospital Last few BS here were normal      Relevant Orders   Comp Met (CMET) (Completed)  Lipid panel (Completed)   Hemoglobin A1c (Completed)    Other Visit Diagnoses    Chronic pain syndrome        Hyperlipidemia        Relevant Medications    metoprolol succinate (TOPROL XL) 50 MG 24 hr tablet    diltiazem (CARDIZEM CD) 180 MG 24 hr capsule       Follow-up: Return in about 3 months (around 02/13/2016), or if symptoms worsen or fail to improve.  Garnet Koyanagi, DO

## 2015-11-15 NOTE — Patient Instructions (Signed)
Hyperglycemia °Hyperglycemia occurs when the glucose (sugar) in your blood is too high. Hyperglycemia can happen for many reasons, but it most often happens to people who do not know they have diabetes or are not managing their diabetes properly.  °CAUSES  °Whether you have diabetes or not, there are other causes of hyperglycemia. Hyperglycemia can occur when you have diabetes, but it can also occur in other situations that you might not be as aware of, such as: °Diabetes °· If you have diabetes and are having problems controlling your blood glucose, hyperglycemia could occur because of some of the following reasons: °¨ Not following your meal plan. °¨ Not taking your diabetes medications or not taking it properly. °¨ Exercising less or doing less activity than you normally do. °¨ Being sick. °Pre-diabetes °· This cannot be ignored. Before people develop Type 2 diabetes, they almost always have "pre-diabetes." This is when your blood glucose levels are higher than normal, but not yet high enough to be diagnosed as diabetes. Research has shown that some long-term damage to the body, especially the heart and circulatory system, may already be occurring during pre-diabetes. If you take action to manage your blood glucose when you have pre-diabetes, you may delay or prevent Type 2 diabetes from developing. °Stress °· If you have diabetes, you may be "diet" controlled or on oral medications or insulin to control your diabetes. However, you may find that your blood glucose is higher than usual in the hospital whether you have diabetes or not. This is often referred to as "stress hyperglycemia." Stress can elevate your blood glucose. This happens because of hormones put out by the body during times of stress. If stress has been the cause of your high blood glucose, it can be followed regularly by your caregiver. That way he/she can make sure your hyperglycemia does not continue to get worse or progress to  diabetes. °Steroids °· Steroids are medications that act on the infection fighting system (immune system) to block inflammation or infection. One side effect can be a rise in blood glucose. Most people can produce enough extra insulin to allow for this rise, but for those who cannot, steroids make blood glucose levels go even higher. It is not unusual for steroid treatments to "uncover" diabetes that is developing. It is not always possible to determine if the hyperglycemia will go away after the steroids are stopped. A special blood test called an A1c is sometimes done to determine if your blood glucose was elevated before the steroids were started. °SYMPTOMS °· Thirsty. °· Frequent urination. °· Dry mouth. °· Blurred vision. °· Tired or fatigue. °· Weakness. °· Sleepy. °· Tingling in feet or leg. °DIAGNOSIS  °Diagnosis is made by monitoring blood glucose in one or all of the following ways: °· A1c test. This is a chemical found in your blood. °· Fingerstick blood glucose monitoring. °· Laboratory results. °TREATMENT  °First, knowing the cause of the hyperglycemia is important before the hyperglycemia can be treated. Treatment may include, but is not be limited to: °· Education. °· Change or adjustment in medications. °· Change or adjustment in meal plan. °· Treatment for an illness, infection, etc. °· More frequent blood glucose monitoring. °· Change in exercise plan. °· Decreasing or stopping steroids. °· Lifestyle changes. °HOME CARE INSTRUCTIONS  °· Test your blood glucose as directed. °· Exercise regularly. Your caregiver will give you instructions about exercise. Pre-diabetes or diabetes which comes on with stress is helped by exercising. °· Eat wholesome,   balanced meals. Eat often and at regular, fixed times. Your caregiver or nutritionist will give you a meal plan to guide your sugar intake. °· Being at an ideal weight is important. If needed, losing as little as 10 to 15 pounds may help improve blood  glucose levels. °SEEK MEDICAL CARE IF:  °· You have questions about medicine, activity, or diet. °· You continue to have symptoms (problems such as increased thirst, urination, or weight gain). °SEEK IMMEDIATE MEDICAL CARE IF:  °· You are vomiting or have diarrhea. °· Your breath smells fruity. °· You are breathing faster or slower. °· You are very sleepy or incoherent. °· You have numbness, tingling, or pain in your feet or hands. °· You have chest pain. °· Your symptoms get worse even though you have been following your caregiver's orders. °· If you have any other questions or concerns. °  °This information is not intended to replace advice given to you by your health care provider. Make sure you discuss any questions you have with your health care provider. °  °Document Released: 05/08/2001 Document Revised: 02/04/2012 Document Reviewed: 07/19/2015 °Elsevier Interactive Patient Education ©2016 Elsevier Inc. ° °

## 2015-11-16 DIAGNOSIS — M159 Polyosteoarthritis, unspecified: Secondary | ICD-10-CM | POA: Insufficient documentation

## 2015-11-16 DIAGNOSIS — F4321 Adjustment disorder with depressed mood: Secondary | ICD-10-CM | POA: Insufficient documentation

## 2015-11-16 DIAGNOSIS — R296 Repeated falls: Secondary | ICD-10-CM | POA: Insufficient documentation

## 2015-11-16 DIAGNOSIS — M5126 Other intervertebral disc displacement, lumbar region: Secondary | ICD-10-CM | POA: Insufficient documentation

## 2015-11-16 DIAGNOSIS — M479 Spondylosis, unspecified: Secondary | ICD-10-CM | POA: Insufficient documentation

## 2015-11-16 DIAGNOSIS — M5136 Other intervertebral disc degeneration, lumbar region: Secondary | ICD-10-CM | POA: Insufficient documentation

## 2015-11-16 DIAGNOSIS — R9430 Abnormal result of cardiovascular function study, unspecified: Secondary | ICD-10-CM | POA: Insufficient documentation

## 2015-11-16 DIAGNOSIS — R6884 Jaw pain: Secondary | ICD-10-CM | POA: Insufficient documentation

## 2015-11-16 DIAGNOSIS — M791 Myalgia, unspecified site: Secondary | ICD-10-CM | POA: Insufficient documentation

## 2015-11-16 DIAGNOSIS — K589 Irritable bowel syndrome without diarrhea: Secondary | ICD-10-CM | POA: Insufficient documentation

## 2015-11-16 DIAGNOSIS — N859 Noninflammatory disorder of uterus, unspecified: Secondary | ICD-10-CM | POA: Insufficient documentation

## 2015-11-17 ENCOUNTER — Other Ambulatory Visit: Payer: Self-pay | Admitting: Family Medicine

## 2015-11-17 DIAGNOSIS — R739 Hyperglycemia, unspecified: Secondary | ICD-10-CM | POA: Insufficient documentation

## 2015-11-17 NOTE — Assessment & Plan Note (Signed)
Check labs Pt was told she was dm in hospital Last few BS here were normal

## 2015-11-17 NOTE — Telephone Encounter (Signed)
Will defer further refills of patient's medications to PCP  

## 2015-11-18 ENCOUNTER — Telehealth: Payer: Self-pay | Admitting: Family Medicine

## 2015-11-18 DIAGNOSIS — R296 Repeated falls: Secondary | ICD-10-CM

## 2015-11-18 NOTE — Telephone Encounter (Signed)
Declined OT , I thought

## 2015-11-18 NOTE — Telephone Encounter (Signed)
patient is requesting outpatient PT, she said she did not decline the PT. Please advise      KP

## 2015-11-18 NOTE — Telephone Encounter (Signed)
Caller name: [place] Can be reached: [phone]  Reason for call: Pt refused OT due to copay. Pt is receiving PT.

## 2015-11-18 NOTE — Telephone Encounter (Signed)
Inform daughter please

## 2015-11-18 NOTE — Telephone Encounter (Signed)
Please advise if this refill is appropriate.     KP 

## 2015-11-18 NOTE — Telephone Encounter (Signed)
To MD for review     KP 

## 2015-11-22 NOTE — Telephone Encounter (Signed)
Ref placed.      KP 

## 2015-11-24 ENCOUNTER — Telehealth: Payer: Self-pay | Admitting: Family Medicine

## 2015-11-24 NOTE — Telephone Encounter (Signed)
Please review and advise     KP 

## 2015-11-24 NOTE — Telephone Encounter (Signed)
I want the pt on medicine hosp d/c her on

## 2015-11-24 NOTE — Telephone Encounter (Signed)
Caller name: Charlane Ferretti  Relationship to patient: [facility]  Can be reached: [phone]   Reason for call: Home Health Nurse needs to know if Dr. Etter Sjogren wants this patient to stay on [place], [place], [place] CD. Also would like a current list of this patients medications faxed [phone] if possible.

## 2015-11-25 ENCOUNTER — Telehealth: Payer: Self-pay | Admitting: Family Medicine

## 2015-11-25 NOTE — Telephone Encounter (Signed)
Noted,    KP

## 2015-11-25 NOTE — Telephone Encounter (Signed)
please review the list and advise if it these are the medications and if it is ok to send to Home Health. Thanks     KP

## 2015-11-25 NOTE — Telephone Encounter (Signed)
Yes she should stay on them for now

## 2015-11-25 NOTE — Telephone Encounter (Signed)
A foot exam is not documented. The note is from the surgeon advising her to follow up with PCP, I faxed it and made the patient aware there is a possibility that it may not be approved.    KP

## 2015-11-25 NOTE — Telephone Encounter (Signed)
Pt declined having the flu vac

## 2015-11-25 NOTE — Telephone Encounter (Signed)
The podiatrist doc the problem and we send the rx --- doesn't make sense but that is what we need to do

## 2015-11-30 ENCOUNTER — Telehealth: Payer: Self-pay | Admitting: Family Medicine

## 2015-11-30 DIAGNOSIS — R799 Abnormal finding of blood chemistry, unspecified: Secondary | ICD-10-CM

## 2015-11-30 DIAGNOSIS — R7989 Other specified abnormal findings of blood chemistry: Secondary | ICD-10-CM

## 2015-11-30 NOTE — Telephone Encounter (Signed)
Pt states she received a message on her machine, during the holidays and she was out of town and she didn't understand the message and would like for you to call her back states she does not know if it was you or not

## 2015-12-01 NOTE — Telephone Encounter (Signed)
Bun and cr elevated---- inc fluid intake Recheck 2-3 weeks Cholesterol--- LDL goal < 100, HDL >40, TG < 150. Diet and exercise will increase HDL and decrease LDL and TG. Fish, Fish Oil, Flaxseed oil will also help increase the HDL and decrease Triglycerides.  Recheck labs in 3 months Lipid, cmp,hgba1c.   Message left to call the office      KP

## 2015-12-02 NOTE — Telephone Encounter (Signed)
[phone]. Please call back.

## 2015-12-14 ENCOUNTER — Other Ambulatory Visit: Payer: Self-pay | Admitting: Family Medicine

## 2015-12-14 NOTE — Telephone Encounter (Signed)
Apt scheduled for Friday to recheck labs.     KP

## 2015-12-16 ENCOUNTER — Other Ambulatory Visit: Payer: PPO

## 2015-12-16 ENCOUNTER — Other Ambulatory Visit: Payer: Self-pay

## 2015-12-16 DIAGNOSIS — E785 Hyperlipidemia, unspecified: Secondary | ICD-10-CM

## 2015-12-16 DIAGNOSIS — I10 Essential (primary) hypertension: Secondary | ICD-10-CM

## 2015-12-16 NOTE — Telephone Encounter (Signed)
She is pretty new to the practice and it is on her med list.     KP

## 2015-12-16 NOTE — Telephone Encounter (Signed)
Cardizem has never been filled here. Please advise     KP

## 2015-12-16 NOTE — Telephone Encounter (Signed)
WHO WAS FILLING IT BEFORE

## 2015-12-17 ENCOUNTER — Other Ambulatory Visit: Payer: Self-pay | Admitting: Family Medicine

## 2015-12-17 MED ORDER — ATORVASTATIN CALCIUM 40 MG PO TABS: 40.0000 mg | ORAL_TABLET | Freq: Every day | ORAL | Status: DC

## 2015-12-17 MED ORDER — DILTIAZEM HCL ER COATED BEADS 180 MG PO CP24: 180.0000 mg | ORAL_CAPSULE | Freq: Every day | ORAL | Status: DC

## 2015-12-28 DIAGNOSIS — F331 Major depressive disorder, recurrent, moderate: Secondary | ICD-10-CM | POA: Diagnosis not present

## 2016-01-02 ENCOUNTER — Other Ambulatory Visit: Payer: Self-pay | Admitting: Family Medicine

## 2016-01-02 ENCOUNTER — Ambulatory Visit: Payer: Self-pay | Admitting: Family Medicine

## 2016-01-02 DIAGNOSIS — Z1231 Encounter for screening mammogram for malignant neoplasm of breast: Secondary | ICD-10-CM

## 2016-01-03 ENCOUNTER — Telehealth: Payer: Self-pay

## 2016-01-03 MED ORDER — GABAPENTIN 400 MG PO CAPS: 400.0000 mg | ORAL_CAPSULE | Freq: Three times a day (TID) | ORAL | Status: DC

## 2016-01-03 NOTE — Telephone Encounter (Signed)
Gabapentin 400 mg 3 caps by mouth at bedtime has been faxed over by the pharmacy [id number]. 300 mg is on her medication list.  please advise    KP

## 2016-01-03 NOTE — Telephone Encounter (Signed)
Rx faxed for Gabapentin 400 mg TID.     KP

## 2016-01-03 NOTE — Telephone Encounter (Signed)
Ok to refill for 1 year 

## 2016-01-06 ENCOUNTER — Telehealth: Payer: Self-pay | Admitting: Family Medicine

## 2016-01-06 DIAGNOSIS — E1151 Type 2 diabetes mellitus with diabetic peripheral angiopathy without gangrene: Secondary | ICD-10-CM

## 2016-01-06 DIAGNOSIS — IMO0002 Reserved for concepts with insufficient information to code with codable children: Secondary | ICD-10-CM

## 2016-01-06 DIAGNOSIS — E1165 Type 2 diabetes mellitus with hyperglycemia: Principal | ICD-10-CM

## 2016-01-06 MED ORDER — METFORMIN HCL 500 MG PO TABS: 500.0000 mg | ORAL_TABLET | Freq: Two times a day (BID) | ORAL | Status: DC

## 2016-01-06 MED ORDER — GLIPIZIDE 5 MG PO TABS: 5.0000 mg | ORAL_TABLET | Freq: Every day | ORAL | Status: DC

## 2016-01-06 NOTE — Telephone Encounter (Signed)
Spoke with patient and she is aware that I can call in the Dm med's but she will need to get her depression med's and sleep med's from her [place]. The omeprazole has been sent last week for once a day. She verbalized understanding. DM med's have been faxed.    KP

## 2016-01-06 NOTE — Telephone Encounter (Signed)
Pharmacy: [id number] - [place], [place]  Reason for call: Pt called upset that she can't get refills when she needs them and is out of medications. Pt stated the pharmacy had been requesting the meds but later stated the pharmacy had not requested them.   Pt needs omeprazole new rx for 2/day. She said that Dr. Etter Sjogren knows she takes 2/day during a flare up which she is having. The pharmacy won't fill it for her stating it is too early. Pt needs belsomra. She said she is out. She started taking in the rest home 1/day for insomnia and doesn't understand why we won't fill. Advised pt it was not in chart and we have not received request from pharmacy. Pt needs fluoxetine. She said she is out. She is taking 1/day. Pt needs metformin. She said she is out. She is taking 2/day of 500mg . Chart shows "historical provider". Pt needs glipizide. She said she is out. She is taking 1/day of 71mh. Chart shows "historical provider". Pt needs potassium. She said she is out. She has been out for a long time but said she is to take 1/day. Chart shows pt received in June 2016 from different provider.

## 2016-01-15 DIAGNOSIS — Z79899 Other long term (current) drug therapy: Secondary | ICD-10-CM | POA: Diagnosis not present

## 2016-01-15 DIAGNOSIS — F329 Major depressive disorder, single episode, unspecified: Secondary | ICD-10-CM | POA: Diagnosis not present

## 2016-01-15 DIAGNOSIS — E669 Obesity, unspecified: Secondary | ICD-10-CM | POA: Diagnosis not present

## 2016-01-15 DIAGNOSIS — R410 Disorientation, unspecified: Secondary | ICD-10-CM | POA: Diagnosis not present

## 2016-01-15 DIAGNOSIS — R45851 Suicidal ideations: Secondary | ICD-10-CM | POA: Diagnosis not present

## 2016-01-15 DIAGNOSIS — F332 Major depressive disorder, recurrent severe without psychotic features: Secondary | ICD-10-CM | POA: Diagnosis not present

## 2016-01-15 DIAGNOSIS — M797 Fibromyalgia: Secondary | ICD-10-CM | POA: Diagnosis not present

## 2016-01-16 ENCOUNTER — Telehealth: Payer: Self-pay | Admitting: *Deleted

## 2016-01-16 DIAGNOSIS — F329 Major depressive disorder, single episode, unspecified: Secondary | ICD-10-CM | POA: Diagnosis not present

## 2016-01-16 DIAGNOSIS — I10 Essential (primary) hypertension: Secondary | ICD-10-CM | POA: Diagnosis not present

## 2016-01-16 DIAGNOSIS — I2510 Atherosclerotic heart disease of native coronary artery without angina pectoris: Secondary | ICD-10-CM | POA: Diagnosis not present

## 2016-01-16 DIAGNOSIS — G4700 Insomnia, unspecified: Secondary | ICD-10-CM | POA: Diagnosis not present

## 2016-01-16 DIAGNOSIS — R410 Disorientation, unspecified: Secondary | ICD-10-CM | POA: Diagnosis not present

## 2016-01-16 DIAGNOSIS — M797 Fibromyalgia: Secondary | ICD-10-CM | POA: Diagnosis not present

## 2016-01-16 DIAGNOSIS — E669 Obesity, unspecified: Secondary | ICD-10-CM | POA: Diagnosis not present

## 2016-01-16 DIAGNOSIS — M4800 Spinal stenosis, site unspecified: Secondary | ICD-10-CM | POA: Diagnosis not present

## 2016-01-16 DIAGNOSIS — Z79899 Other long term (current) drug therapy: Secondary | ICD-10-CM | POA: Diagnosis not present

## 2016-01-16 DIAGNOSIS — H9190 Unspecified hearing loss, unspecified ear: Secondary | ICD-10-CM | POA: Diagnosis not present

## 2016-01-16 DIAGNOSIS — F332 Major depressive disorder, recurrent severe without psychotic features: Secondary | ICD-10-CM | POA: Diagnosis not present

## 2016-01-16 DIAGNOSIS — M549 Dorsalgia, unspecified: Secondary | ICD-10-CM | POA: Diagnosis not present

## 2016-01-16 DIAGNOSIS — Z7982 Long term (current) use of aspirin: Secondary | ICD-10-CM | POA: Diagnosis not present

## 2016-01-16 DIAGNOSIS — Z6834 Body mass index (BMI) 34.0-34.9, adult: Secondary | ICD-10-CM | POA: Diagnosis not present

## 2016-01-16 DIAGNOSIS — Z87891 Personal history of nicotine dependence: Secondary | ICD-10-CM | POA: Diagnosis not present

## 2016-01-16 DIAGNOSIS — F322 Major depressive disorder, single episode, severe without psychotic features: Secondary | ICD-10-CM | POA: Diagnosis not present

## 2016-01-16 DIAGNOSIS — Z9049 Acquired absence of other specified parts of digestive tract: Secondary | ICD-10-CM | POA: Diagnosis not present

## 2016-01-16 DIAGNOSIS — R45851 Suicidal ideations: Secondary | ICD-10-CM | POA: Diagnosis not present

## 2016-01-16 DIAGNOSIS — G8929 Other chronic pain: Secondary | ICD-10-CM | POA: Diagnosis not present

## 2016-01-16 DIAGNOSIS — E785 Hyperlipidemia, unspecified: Secondary | ICD-10-CM | POA: Diagnosis not present

## 2016-01-16 DIAGNOSIS — Z9119 Patient's noncompliance with other medical treatment and regimen: Secondary | ICD-10-CM | POA: Diagnosis not present

## 2016-01-16 DIAGNOSIS — E119 Type 2 diabetes mellitus without complications: Secondary | ICD-10-CM | POA: Diagnosis not present

## 2016-01-16 DIAGNOSIS — Z8249 Family history of ischemic heart disease and other diseases of the circulatory system: Secondary | ICD-10-CM | POA: Diagnosis not present

## 2016-01-16 DIAGNOSIS — F419 Anxiety disorder, unspecified: Secondary | ICD-10-CM | POA: Diagnosis not present

## 2016-01-16 DIAGNOSIS — Z7984 Long term (current) use of oral hypoglycemic drugs: Secondary | ICD-10-CM | POA: Diagnosis not present

## 2016-01-16 DIAGNOSIS — Z90710 Acquired absence of both cervix and uterus: Secondary | ICD-10-CM | POA: Diagnosis not present

## 2016-01-16 NOTE — Telephone Encounter (Signed)
Received [place] with physician order for change of Tx; forwarded to provider/SLS 02/20

## 2016-01-17 DIAGNOSIS — Z79899 Other long term (current) drug therapy: Secondary | ICD-10-CM | POA: Diagnosis not present

## 2016-01-17 DIAGNOSIS — F332 Major depressive disorder, recurrent severe without psychotic features: Secondary | ICD-10-CM | POA: Diagnosis not present

## 2016-01-18 ENCOUNTER — Ambulatory Visit: Payer: Self-pay

## 2016-01-18 DIAGNOSIS — F332 Major depressive disorder, recurrent severe without psychotic features: Secondary | ICD-10-CM | POA: Diagnosis not present

## 2016-01-23 ENCOUNTER — Ambulatory Visit
Admission: RE | Admit: 2016-01-23 | Discharge: 2016-01-23 | Disposition: A | Discharge status: Home / Self Care | Payer: PPO | Source: Ambulatory Visit | Attending: Family Medicine | Admitting: Family Medicine

## 2016-01-23 DIAGNOSIS — Z1231 Encounter for screening mammogram for malignant neoplasm of breast: Secondary | ICD-10-CM

## 2016-01-24 ENCOUNTER — Ambulatory Visit: Payer: Self-pay | Admitting: Neurology

## 2016-01-25 DIAGNOSIS — F331 Major depressive disorder, recurrent, moderate: Secondary | ICD-10-CM | POA: Diagnosis not present

## 2016-01-26 ENCOUNTER — Ambulatory Visit (INDEPENDENT_AMBULATORY_CARE_PROVIDER_SITE_OTHER): Payer: PPO | Admitting: Family Medicine

## 2016-01-26 ENCOUNTER — Encounter: Payer: Self-pay | Admitting: Family Medicine

## 2016-01-26 VITALS — BP 130/80 | HR 70 | Temp 99.0°F | Ht 64.0 in | Wt 224.4 lb

## 2016-01-26 DIAGNOSIS — E785 Hyperlipidemia, unspecified: Secondary | ICD-10-CM

## 2016-01-26 DIAGNOSIS — E1151 Type 2 diabetes mellitus with diabetic peripheral angiopathy without gangrene: Secondary | ICD-10-CM

## 2016-01-26 DIAGNOSIS — I10 Essential (primary) hypertension: Secondary | ICD-10-CM | POA: Diagnosis not present

## 2016-01-26 DIAGNOSIS — R799 Abnormal finding of blood chemistry, unspecified: Secondary | ICD-10-CM

## 2016-01-26 DIAGNOSIS — E213 Hyperparathyroidism, unspecified: Secondary | ICD-10-CM

## 2016-01-26 DIAGNOSIS — F329 Major depressive disorder, single episode, unspecified: Secondary | ICD-10-CM

## 2016-01-26 DIAGNOSIS — F32A Depression, unspecified: Secondary | ICD-10-CM

## 2016-01-26 DIAGNOSIS — E876 Hypokalemia: Secondary | ICD-10-CM

## 2016-01-26 DIAGNOSIS — F332 Major depressive disorder, recurrent severe without psychotic features: Secondary | ICD-10-CM

## 2016-01-26 DIAGNOSIS — E1165 Type 2 diabetes mellitus with hyperglycemia: Secondary | ICD-10-CM

## 2016-01-26 DIAGNOSIS — IMO0002 Reserved for concepts with insufficient information to code with codable children: Secondary | ICD-10-CM

## 2016-01-26 LAB — COMPREHENSIVE METABOLIC PANEL
ALBUMIN: 4.2 g/dL (ref 3.5–5.2)
ALT: 13 U/L (ref 0–35)
AST: 11 U/L (ref 0–37)
Alkaline Phosphatase: 89 U/L (ref 39–117)
BUN: 31 mg/dL — AB (ref 6–23)
CHLORIDE: 110 meq/L (ref 96–112)
CO2: 22 meq/L (ref 19–32)
CREATININE: 1.39 mg/dL — AB (ref 0.40–1.20)
Calcium: 9.7 mg/dL (ref 8.4–10.5)
GFR: 39.55 mL/min — ABNORMAL LOW (ref >60.00)
GLUCOSE: 119 mg/dL — AB (ref 70–99)
Potassium: 3.7 mEq/L (ref 3.5–5.1)
SODIUM: 142 meq/L (ref 135–145)
Total Bilirubin: 0.2 mg/dL (ref 0.2–1.2)
Total Protein: 6.9 g/dL (ref 6.0–8.3)

## 2016-01-26 LAB — CBC WITH DIFFERENTIAL/PLATELET
BASOS PCT: 0.5 % (ref 0.0–3.0)
Basophils Absolute: 0 10*3/uL (ref 0.0–0.1)
EOS ABS: 0.1 10*3/uL (ref 0.0–0.7)
EOS PCT: 0.9 % (ref 0.0–5.0)
HCT: 38.6 % (ref 36.0–46.0)
HEMOGLOBIN: 12.6 g/dL (ref 12.0–15.0)
Lymphocytes Relative: 20.3 % (ref 12.0–46.0)
Lymphs Abs: 1.6 10*3/uL (ref 0.7–4.0)
MCHC: 32.5 g/dL (ref 30.0–36.0)
MCV: 87.5 fl (ref 78.0–100.0)
MONO ABS: 0.5 10*3/uL (ref 0.1–1.0)
Monocytes Relative: 6.3 % (ref 3.0–12.0)
NEUTROS ABS: 5.6 10*3/uL (ref 1.4–7.7)
NEUTROS PCT: 72 % (ref 43.0–77.0)
PLATELETS: 323 10*3/uL (ref 150.0–400.0)
RBC: 4.41 Mil/uL (ref 3.87–5.11)
RDW: 17.5 % — AB (ref 11.5–15.5)
WBC: 7.7 10*3/uL (ref 4.0–10.5)

## 2016-01-26 LAB — LIPID PANEL
CHOLESTEROL: 151 mg/dL (ref 0–200)
HDL: 36.8 mg/dL — AB (ref >39.00)
NonHDL: 114.68
TRIGLYCERIDES: 322 mg/dL — AB (ref 0.0–149.0)
Total CHOL/HDL Ratio: 4
VLDL: 64.4 mg/dL — AB (ref 0.0–40.0)

## 2016-01-26 LAB — LDL CHOLESTEROL, DIRECT: Direct LDL: 83 mg/dL

## 2016-01-26 LAB — TSH: TSH: 1.51 u[IU]/mL (ref 0.35–4.50)

## 2016-01-26 MED ORDER — POTASSIUM CHLORIDE ER 10 MEQ PO TBCR: 10.0000 meq | EXTENDED_RELEASE_TABLET | Freq: Three times a day (TID) | ORAL | Status: DC

## 2016-01-26 MED ORDER — GLIPIZIDE 5 MG PO TABS: 5.0000 mg | ORAL_TABLET | Freq: Every day | ORAL | Status: DC

## 2016-01-26 MED ORDER — METOPROLOL SUCCINATE ER 50 MG PO TB24: 50.0000 mg | ORAL_TABLET | Freq: Two times a day (BID) | ORAL | Status: DC

## 2016-01-26 NOTE — Patient Instructions (Signed)
Major Depressive Disorder Major depressive disorder is a mental illness. It also may be called clinical depression or unipolar depression. Major depressive disorder usually causes feelings of sadness, hopelessness, or helplessness. Some people with this disorder do not feel particularly sad but lose interest in doing things they used to enjoy (anhedonia). Major depressive disorder also can cause physical symptoms. It can interfere with work, school, relationships, and other normal everyday activities. The disorder varies in severity but is longer lasting and more serious than the sadness we all feel from time to time in our lives. Major depressive disorder often is triggered by stressful life events or major life changes. Examples of these triggers include divorce, loss of your job or home, a move, and the death of a family member or close friend. Sometimes this disorder occurs for no obvious reason at all. People who have family members with major depressive disorder or bipolar disorder are at higher risk for developing this disorder, with or without life stressors. Major depressive disorder can occur at any age. It may occur just once in your life (single episode major depressive disorder). It may occur multiple times (recurrent major depressive disorder). SYMPTOMS People with major depressive disorder have either anhedonia or depressed mood on nearly a daily basis for at least 2 weeks or longer. Symptoms of depressed mood include:  Feelings of sadness (blue or down in the dumps) or emptiness.  Feelings of hopelessness or helplessness.  Tearfulness or episodes of crying (may be observed by others).  Irritability (children and adolescents). In addition to depressed mood or anhedonia or both, people with this disorder have at least four of the following symptoms:  Difficulty sleeping or sleeping too much.   Significant change (increase or decrease) in appetite or weight.   Lack of energy or  motivation.  Feelings of guilt and worthlessness.   Difficulty concentrating, remembering, or making decisions.  Unusually slow movement (psychomotor retardation) or restlessness (as observed by others).   Recurrent wishes for death, recurrent thoughts of self-harm (suicide), or a suicide attempt. People with major depressive disorder commonly have persistent negative thoughts about themselves, other people, and the world. People with severe major depressive disorder may experiencedistorted beliefs or perceptions about the world (psychotic delusions). They also may see or hear things that are not real (psychotic hallucinations). DIAGNOSIS Major depressive disorder is diagnosed through an assessment by your health care provider. Your health care provider will ask aboutaspects of your daily life, such as mood,sleep, and appetite, to see if you have the diagnostic symptoms of major depressive disorder. Your health care provider may ask about your medical history and use of alcohol or drugs, including prescription medicines. Your health care provider also may do a physical exam and blood work. This is because certain medical conditions and the use of certain substances can cause major depressive disorder-like symptoms (secondary depression). Your health care provider also may refer you to a mental health specialist for further evaluation and treatment. TREATMENT It is important to recognize the symptoms of major depressive disorder and seek treatment. The following treatments can be prescribed for this disorder:   Medicine. Antidepressant medicines usually are prescribed. Antidepressant medicines are thought to correct chemical imbalances in the brain that are commonly associated with major depressive disorder. Other types of medicine may be added if the symptoms do not respond to antidepressant medicines alone or if psychotic delusions or hallucinations occur.  Talk therapy. Talk therapy can be  helpful in treating major depressive disorder by providing   support, education, and guidance. Certain types of talk therapy also can help with negative thinking (cognitive behavioral therapy) and with relationship issues that trigger this disorder (interpersonal therapy). A mental health specialist can help determine which treatment is best for you. Most people with major depressive disorder do well with a combination of medicine and talk therapy. Treatments involving electrical stimulation of the brain can be used in situations with extremely severe symptoms or when medicine and talk therapy do not work over time. These treatments include electroconvulsive therapy, transcranial magnetic stimulation, and vagal nerve stimulation.   This information is not intended to replace advice given to you by your health care provider. Make sure you discuss any questions you have with your health care provider.   Document Released: 03/09/2013 Document Revised: 12/03/2014 Document Reviewed: 03/09/2013 Elsevier Interactive Patient Education 2016 Elsevier Inc.  

## 2016-01-26 NOTE — Progress Notes (Signed)
Pre visit review using our clinic review tool, if applicable. No additional management support is needed unless otherwise documented below in the visit note. 

## 2016-01-27 LAB — PTH, INTACT AND CALCIUM
CALCIUM: 9.4 mg/dL (ref 8.4–10.5)
PTH: 176 pg/mL — ABNORMAL HIGH (ref 14–64)

## 2016-01-29 NOTE — Assessment & Plan Note (Signed)
With anxiety and confusion Pt has had mult ED visits and admissions to behavioral health.  She has a lot of difficulty with medications Referral for [facility] has been put in to get help and home health Pt would benefit from assisted living but she has been resistant to this

## 2016-01-29 NOTE — Progress Notes (Signed)
Patient ID: Stephanie Mckenzie, female    DOB: 09/21/1941  Age: 75 y.o. MRN: [id number]    Subjective:  Subjective HPI Stephanie Mckenzie presents for hosp f/u -- she was admitted to behavior health for depression  She is under the care of a psychiatrist.  She con't to confuse her meds .    Review of Systems  Constitutional: Negative for diaphoresis, appetite change, fatigue and unexpected weight change.  Eyes: Negative for pain, redness and visual disturbance.  Respiratory: Negative for cough, chest tightness, shortness of breath and wheezing.   Cardiovascular: Negative for chest pain, palpitations and leg swelling.  Endocrine: Negative for cold intolerance, heat intolerance, polydipsia, polyphagia and polyuria.  Genitourinary: Negative for dysuria, frequency and difficulty urinating.  Neurological: Negative for dizziness, light-headedness, numbness and headaches.  Psychiatric/Behavioral: Positive for confusion, sleep disturbance, dysphoric mood and decreased concentration. Negative for suicidal ideas and self-injury. The patient is nervous/anxious.     History Past Medical History  Diagnosis Date  . Hypertension   . Migraine   . Depression   . Edema 10/09/2015    She has past surgical history that includes Cesarean section; Appendectomy; Parathyroidectomy; Laminectomy; Coronary angioplasty; and Abdominal hysterectomy.   Her family history includes Diabetes in her mother; Hypertension in her mother; Thyroid disease in her mother.She reports that she has quit smoking. Her smoking use included Cigarettes. She has never used smokeless tobacco. She reports that she drinks alcohol. She reports that she does not use illicit drugs.  Current Outpatient Prescriptions on File Prior to Visit  Medication Sig Dispense Refill  . atorvastatin (LIPITOR) 40 MG tablet Take 1 tablet (40 mg total) by mouth daily. 90 tablet 1  . cholecalciferol (VITAMIN D) 1000 UNITS tablet Take 2,000 Units by mouth.    .  diltiazem (CARDIZEM CD) 180 MG 24 hr capsule Take 1 capsule (180 mg total) by mouth daily. 90 capsule 1  . FLUoxetine (PROZAC) 40 MG capsule Take 1 capsule by mouth daily.    . folic acid (FOLVITE) 1 MG tablet Take 1 tablet (1 mg total) by mouth daily. 30 tablet 5  . furosemide (LASIX) 20 MG tablet TAKE ONE TABLET BY MOUTH ONCE DAILY AS NEEDED FOR EDEMA 15 tablet 0  . metFORMIN (GLUCOPHAGE) 500 MG tablet Take 1 tablet (500 mg total) by mouth 2 (two) times daily with a meal. 60 tablet 5  . mirtazapine (REMERON) 45 MG tablet Take 1 tablet (45 mg total) by mouth at bedtime. Cvejin-psych 30 tablet 0  . torsemide (DEMADEX) 20 MG tablet 2 po qd 60 tablet 2  . vitamin B-12 (CYANOCOBALAMIN) 1000 MCG tablet Take 1,000 mcg by mouth daily.    Marland Kitchen ALPRAZolam (XANAX) 0.5 MG tablet Take 0.5 mg by mouth. Reported on 01/26/2016    . ARIPiprazole (ABILIFY) 15 MG tablet Take 15 mg by mouth daily. Reported on 01/26/2016 30 tablet 5  . butalbital-acetaminophen-caffeine (FIORICET) 50-325-40 MG tablet Take 1 tablet by mouth every 6 (six) hours as needed for headache. (Patient not taking: Reported on 01/26/2016) 20 tablet 0  . gabapentin (NEURONTIN) 400 MG capsule Take 1 capsule (400 mg total) by mouth 3 (three) times daily. (Patient not taking: Reported on 01/26/2016) 90 capsule 11  . losartan (COZAAR) 100 MG tablet Take 1 tablet (100 mg total) by mouth daily. (Patient not taking: Reported on 01/26/2016) 30 tablet 2  . omeprazole (PRILOSEC) 40 MG capsule TAKE ONE CAPSULE BY MOUTH ONCE DAILY (Patient not taking: Reported on 01/26/2016) 30 capsule 11  .  traMADol (ULTRAM) 50 MG tablet Take 50 mg by mouth every 6 (six) hours as needed. Reported on 01/26/2016     No current facility-administered medications on file prior to visit.     Objective:  Objective Physical Exam  Constitutional: She appears well-developed and well-nourished.  HENT:  Head: Normocephalic and atraumatic.  Eyes: Conjunctivae and EOM are normal.  Neck: Normal  range of motion. Neck supple. No JVD present. Carotid bruit is not present. No thyromegaly present.  Cardiovascular: Normal rate, regular rhythm and normal heart sounds.   No murmur heard. Pulmonary/Chest: Effort normal and breath sounds normal. No respiratory distress. She has no wheezes. She has no rales. She exhibits no tenderness.  Musculoskeletal: She exhibits no edema.  Neurological: She is alert.  Psychiatric: Cognition and memory are impaired. She exhibits a depressed mood.  Nursing note and vitals reviewed.  BP 130/80 mmHg  Pulse 70  Temp(Src) 99 F (37.2 C) (Oral)  Ht 5' 4"  (1.626 m)  Wt 224 lb 6.4 oz (101.787 kg)  BMI 38.50 kg/m2  SpO2 97% Wt Readings from Last 3 Encounters:  01/26/16 224 lb 6.4 oz (101.787 kg)  11/15/15 219 lb (99.338 kg)  10/11/15 226 lb 6.4 oz (102.694 kg)     Lab Results  Component Value Date   WBC 7.7 01/26/2016   HGB 12.6 01/26/2016   HCT 38.6 01/26/2016   PLT 323.0 01/26/2016   GLUCOSE 119* 01/26/2016   CHOL 151 01/26/2016   TRIG 322.0* 01/26/2016   HDL 36.80* 01/26/2016   LDLDIRECT 83.0 01/26/2016   LDLCALC 83 05/26/2015   ALT 13 01/26/2016   AST 11 01/26/2016   NA 142 01/26/2016   K 3.7 01/26/2016   CL 110 01/26/2016   CREATININE 1.39* 01/26/2016   BUN 31* 01/26/2016   CO2 22 01/26/2016   TSH 1.51 01/26/2016   INR 0.88 12/27/2009   HGBA1C 6.5 11/15/2015    Mm Screening Breast Tomo Bilateral  01/25/2016  CLINICAL DATA:  Screening. EXAM: DIGITAL SCREENING BILATERAL MAMMOGRAM WITH 3D TOMO WITH CAD COMPARISON:  Previous exam(s). ACR Breast Density Category c: The breast tissue is heterogeneously dense, which may obscure small masses. FINDINGS: There are no findings suspicious for malignancy. Images were processed with CAD. IMPRESSION: No mammographic evidence of malignancy. A result letter of this screening mammogram will be mailed directly to the patient. RECOMMENDATION: Screening mammogram in one year. (Code:SM-B-01Y) BI-RADS  CATEGORY  1: Negative. Electronically Signed   By: Nolon Nations M.D.   On: 01/25/2016 11:37     Assessment & Plan:  Plan I have discontinued Ms. Pardee's diphenoxylate-atropine and magic mouthwash w/lidocaine. I have also changed her potassium chloride and metoprolol succinate. Additionally, I am having her maintain her vitamin B-12, cholecalciferol, butalbital-acetaminophen-caffeine, losartan, traMADol, ARIPiprazole, ALPRAZolam, mirtazapine, folic acid, torsemide, FLUoxetine, furosemide, atorvastatin, diltiazem, omeprazole, gabapentin, metFORMIN, aspirin, Collagen-Vitamin C (COLLAGEN PLUS VITAMIN C PO), and glipiZIDE.  Meds ordered this encounter  Medications  . aspirin 81 MG tablet    Sig: Take 81 mg by mouth daily.  . Collagen-Vitamin C (COLLAGEN PLUS VITAMIN C PO)    Sig: Take by mouth.  . DISCONTD: potassium chloride (K-DUR) 10 MEQ tablet    Sig: Take 10 mEq by mouth 3 (three) times daily.  . potassium chloride (K-DUR) 10 MEQ tablet    Sig: Take 1 tablet (10 mEq total) by mouth 3 (three) times daily.    Dispense:  90 tablet    Refill:  1  . glipiZIDE (GLUCOTROL) 5 MG  tablet    Sig: Take 1 tablet (5 mg total) by mouth daily before breakfast.    Dispense:  30 tablet    Refill:  5  . metoprolol succinate (TOPROL XL) 50 MG 24 hr tablet    Sig: Take 1 tablet (50 mg total) by mouth 2 (two) times daily.    Dispense:  60 tablet    Refill:  5    Problem List Items Addressed This Visit    Depression   Relevant Orders   AMB Referral to [facility]   MDD (major depressive disorder) ([place])    With anxiety and confusion Pt has had mult ED visits and admissions to behavioral health.  She has a lot of difficulty with medications Referral for [facility] has been put in to get help and home health Pt would benefit from assisted living but she has been resistant to this      Hyperparathyroidism [facility])   Relevant Orders   Ambulatory referral to Endocrinology   AMB Referral to [facility]   PTH, Intact and Calcium (Completed)    Other Visit Diagnoses    Essential hypertension    -  Primary    Relevant Medications    aspirin 81 MG tablet    Collagen-Vitamin C (COLLAGEN PLUS VITAMIN C PO)    metoprolol succinate (TOPROL XL) 50 MG 24 hr tablet    Other Relevant Orders    AMB Referral to [facility]    TSH (Completed)    POCT urinalysis dipstick    Lipid panel (Completed)    CBC with Differential/Platelet (Completed)    Comp Met (CMET) (Completed)    DM (diabetes mellitus) type II uncontrolled, periph vascular disorder (HCC)        Relevant Medications    aspirin 81 MG tablet    Collagen-Vitamin C (COLLAGEN PLUS VITAMIN C PO)    glipiZIDE (GLUCOTROL) 5 MG tablet    metoprolol succinate (TOPROL XL) 50 MG 24 hr tablet    Other Relevant Orders    Ambulatory referral to Endocrinology    AMB Referral to [facility]    TSH (Completed)    POCT urinalysis dipstick    Lipid panel (Completed)    CBC with Differential/Platelet (Completed)    Comp Met (CMET) (Completed)    Hypokalemia        Relevant Medications    potassium chloride (K-DUR) 10 MEQ tablet    Other Relevant Orders    AMB Referral to [facility]    Comp Met (CMET) (Completed)    Hyperlipidemia        Relevant Medications    aspirin 81 MG tablet    Collagen-Vitamin C (COLLAGEN PLUS VITAMIN C PO)    metoprolol succinate (TOPROL XL) 50 MG 24 hr tablet       Follow-up: Return in about 3 months (around 04/27/2016), or if symptoms worsen or fail to improve.  Garnet Koyanagi, DO

## 2016-02-01 NOTE — Addendum Note (Signed)
Addended by: Dorrene German on: 02/01/2016 01:15 PM   Modules accepted: Orders

## 2016-02-02 ENCOUNTER — Telehealth: Payer: Self-pay | Admitting: Family Medicine

## 2016-02-02 NOTE — Telephone Encounter (Signed)
Relation to [id number] Call back number:[phone]   Reason for call:  Patient checking on the status of physical therapy referral for back pain and would like to know about her "plan of care for her medication" please advise

## 2016-02-02 NOTE — Telephone Encounter (Signed)
Spoke with patient and I made her aware that [facility] has the referral and they will be contacting her to set something up.     KP

## 2016-02-06 ENCOUNTER — Telehealth: Payer: Self-pay | Admitting: *Deleted

## 2016-02-06 ENCOUNTER — Ambulatory Visit: Payer: Self-pay | Admitting: Neurology

## 2016-02-06 DIAGNOSIS — Z029 Encounter for administrative examinations, unspecified: Secondary | ICD-10-CM

## 2016-02-06 NOTE — Telephone Encounter (Signed)
Received Home Health Certification and Plan of Care via mail; forwarded to provider/SLS 03/13

## 2016-02-07 ENCOUNTER — Other Ambulatory Visit: Payer: Self-pay | Admitting: Family Medicine

## 2016-02-07 NOTE — Telephone Encounter (Signed)
Completed document mailed; copy to scan/SLS 03/14

## 2016-02-08 ENCOUNTER — Telehealth: Payer: Self-pay | Admitting: Family Medicine

## 2016-02-08 NOTE — Telephone Encounter (Signed)
error:[id number] ° °

## 2016-02-09 ENCOUNTER — Other Ambulatory Visit: Payer: Self-pay

## 2016-02-09 ENCOUNTER — Telehealth: Payer: Self-pay | Admitting: Family Medicine

## 2016-02-09 NOTE — Patient Outreach (Signed)
[facility]) Care Management  02/09/2016  Stephanie Mckenzie 1941/02/25 [id number]  Telephone call to patient regarding primary MD referral. Unable to reach patient. HIPAA compliant voice message left with call back phone number.   PLAN: RNCM will attempt 2nd telephone call to patient within 1 week.  Quinn Plowman RN,BSN,CCM [facility] Telephonic  [phone]

## 2016-02-09 NOTE — Telephone Encounter (Signed)
Caller name:Self Relationship to patient: Can be reached: [phone] Pharmacy:  Reason for call:Plse call patient. She was returning a call

## 2016-02-10 ENCOUNTER — Other Ambulatory Visit: Payer: Self-pay

## 2016-02-10 DIAGNOSIS — E0821 Diabetes mellitus due to underlying condition with diabetic nephropathy: Secondary | ICD-10-CM

## 2016-02-10 DIAGNOSIS — Z79899 Other long term (current) drug therapy: Secondary | ICD-10-CM

## 2016-02-10 NOTE — Telephone Encounter (Signed)
I called the patient back and left a message to call. She just needs to schedule a lab apt.     KP

## 2016-02-10 NOTE — Patient Outreach (Signed)
[facility]) Care Management  02/10/2016  Stephanie Mckenzie September 04, 1941 [id number]   SUBJECTIVE:  Telephone call to patient regarding primary MD referral. HIPAA verified with patient. Discussed and offered [facility] care management services to patient. Patient verbally agreed to receive services.  Patient request community case manager.   Patient states she has gained 100 lbs over the last 9 months. Patient states she doesn't understand why and doesn't understand why she is not able to lose the weight when she is putting forth an effort. Patient states he height is 34ft 4in and her weight is 200lbs. Patient states she is concerned about having a heart attack. Patient states she cannot do exercises and her activity is limited due to her weight. Patient states she does not have a scale but it wouldn't be a problem to get one. Patient states she was diagnosed with  Diabetes in December 2016 when she was admitted with pneumonia. Patient states she is not checking her blood sugars regularly. Patient states the last time her blood sugar was checked was at her primary MD appointment on 01/26/16. Patient states she does not know what her A1c is and was unsure what it was. Patient states her blood sugars have been between 80-115.  Patient states she was checking her blood sugar approximately 3 times per week.  Patient states she received a call from her primary MD office telling her her triglycerides were high from her blood work on 01/26/16. Patient states she wasn't told how high or what to do about it. Patient states she did call the office to ask about what to do. Patient still unsure.  Patient states she is frustrated with the fact that she is on so many medications. Patient states she takes approximately 20 plus medications daily. Patient states she has questions about her medications regarding how they are dispensing the dosage. Patient states she has a few medications in which the dosage fluctuates up  and down based on the prescription.  Patient states she has fallen twice within the past year. Patient states 1 fall resulted in injury with stiches to her head.  Patient states she has "major depression."  Patient states her psychiatrist is trying to regulate her medication currently. Patient states she has been in the emergency room 2 times within the past 6 months, once for pneumonia and one time for depression.   ASSESSMENT: Primary MD referral for diabetes.   PLAN:  RNCM will refer patient to community case manager and pharmacy.   Quinn Plowman RN,BSN,CCM [facility] Telephonic  [phone]

## 2016-02-13 ENCOUNTER — Ambulatory Visit: Payer: Self-pay | Admitting: Family Medicine

## 2016-02-13 ENCOUNTER — Ambulatory Visit (INDEPENDENT_AMBULATORY_CARE_PROVIDER_SITE_OTHER): Payer: PPO | Admitting: Neurology

## 2016-02-13 ENCOUNTER — Encounter: Payer: Self-pay | Admitting: Neurology

## 2016-02-13 VITALS — BP 142/84 | HR 82 | Wt 224.0 lb

## 2016-02-13 DIAGNOSIS — F32A Depression, unspecified: Secondary | ICD-10-CM

## 2016-02-13 DIAGNOSIS — F329 Major depressive disorder, single episode, unspecified: Secondary | ICD-10-CM

## 2016-02-13 DIAGNOSIS — R413 Other amnesia: Secondary | ICD-10-CM

## 2016-02-13 NOTE — Progress Notes (Signed)
NEUROLOGY FOLLOW UP OFFICE NOTE  OVEDA HILLSON [id number]  HISTORY OF PRESENT ILLNESS: I had the pleasure of seeing Stephanie Mckenzie in follow-up in the neurology clinic on 02/13/2016.  The patient was last seen 7 months ago for confusion and worsening memory. MMSE in August 2016 was 27/30. She was noted to be tearful at that time. TSH and B12 normal. 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 Since her last visit, she reports feeling much better. She was admitted for inpatient psychiatry at [facility] in February, and reports that her medications were streamlined, she feels much better getting off a lot of her medications. Her thinking is clearer. She denies getting lost driving. She denies missing medications. She has some missed bills, but reports this is due to financial issues. She continues to work on her mood. She denies any headaches, dizziness, diplopia, focal numbness/tingling/weakness, no falls.   HPI 07/25/15: This is a 75 yo RH woman with a history of hypertension, hyperlipidemia, depression, anxiety, fibromyalgia, who presented for confusion and worsening memory. She and her daughter report confusion and memory changes became more noticeable after inpatient psychiatric admission for depression last June 2016. There was some medication changes made, she was confused about how she was supposed to take her medications. She went to her PCP office in July but apparently had no appointment and was scheduled to see psychiatry instead. She was noted to come in her pajama top inside out and backwards. Her daughter has noticed difficulties with word-finding, misplacing things at home. She had some confusion on gabapentin instructions, and her daughter had initially helped her filling her pillbox, but the patient now takes care of this herself and denies missing medications. She had a hysterectomy last November and had some confusion while on pain medications. She states that she gets  confused about what she is doing, confused about the date and gets appointments mixed up. She lives by herself and denies getting lost driving. She occasionally forgets a bill payment. She easily gets distracted, loses her train of thought. Her daughter denies that she repeats herself, and denies any paranoia. Her daughter does report that her mother does not care anymore about her her hygiene and would not bathe or change clothes.   She has frontal throbbing headaches every couple of days that did improved upon stopping Lisinopril. Headaches resolve pretty quickly if she takes Advil or Excedrin. If headaches are more severe, she takes BC powder, usually taking a couple a week. She has had some nausea the past couple of weeks, independent of the headaches ("I can't find anything to eat to soothe my stomach"). She has chronic back pain, as well as aching in her arms, shoulders, back, and legs. She has been taking Gabapentin 300mg  BID for the past couple of months for the fibromyalgia. She denies taking the Tramadol or Trazodone prescribed. She denies any dizziness, diplopia, dysarthria, dysphagia, focal numbness/tingling/weakness. No anosmia or tremors. She had a concussion 4-5 years ago with brief loss of consciousness. She drinks alcohol occasionally. No family history of memory loss. At the end of the visit, she endorsed continued depression, she feels the Trintillex started 4 weeks ago is not helping. She became tearful relating her stress with family, that they do not love her and do not call her if they would be gone for 3 days. I spoke to her daughter after the visit, and she reports she and her brother are very involved with her care, but that  her mother has let her depression for 20+years go untreated, and it has been harder to deal with this.  PAST MEDICAL HISTORY: Past Medical History  Diagnosis Date  . Hypertension   . Migraine   . Depression   . Edema 10/09/2015    MEDICATIONS: Current  Outpatient Prescriptions on File Prior to Visit  Medication Sig Dispense Refill  . aspirin 81 MG tablet Take 81 mg by mouth daily.    Marland Kitchen atorvastatin (LIPITOR) 40 MG tablet Take 1 tablet (40 mg total) by mouth daily. 90 tablet 1  . cholecalciferol (VITAMIN D) 1000 UNITS tablet Take 2,000 Units by mouth.    . Collagen-Vitamin C (COLLAGEN PLUS VITAMIN C PO) Take by mouth.    . diltiazem (CARDIZEM CD) 180 MG 24 hr capsule Take 1 capsule (180 mg total) by mouth daily. 90 capsule 1  . FLUoxetine (PROZAC) 40 MG capsule Take 1 capsule by mouth daily.    . folic acid (FOLVITE) 1 MG tablet Take 1 tablet (1 mg total) by mouth daily. 30 tablet 5  . glipiZIDE (GLUCOTROL) 5 MG tablet Take 1 tablet (5 mg total) by mouth daily before breakfast. 30 tablet 5  . metoprolol succinate (TOPROL XL) 50 MG 24 hr tablet Take 1 tablet (50 mg total) by mouth 2 (two) times daily. 60 tablet 5  . mirtazapine (REMERON) 45 MG tablet Take 1 tablet (45 mg total) by mouth at bedtime. Cvejin-psych 30 tablet 0  . omeprazole (PRILOSEC) 40 MG capsule TAKE ONE CAPSULE BY MOUTH ONCE DAILY 30 capsule 11  . vitamin B-12 (CYANOCOBALAMIN) 1000 MCG tablet Take 1,000 mcg by mouth daily.    . furosemide (LASIX) 20 MG tablet TAKE ONE TABLET BY MOUTH ONCE DAILY AS NEEDED FOR EDEMA (Patient not taking: Reported on 02/13/2016) 15 tablet 1  . metFORMIN (GLUCOPHAGE) 500 MG tablet Take 1 tablet (500 mg total) by mouth 2 (two) times daily with a meal. 60 tablet 5  . potassium chloride (K-DUR) 10 MEQ tablet Take 1 tablet (10 mEq total) by mouth 3 (three) times daily. (Patient not taking: Reported on 02/13/2016) 90 tablet 1  . torsemide (DEMADEX) 20 MG tablet 2 po qd (Patient not taking: Reported on 02/13/2016) 60 tablet 2   No current facility-administered medications on file prior to visit.    ALLERGIES: Allergies  Allergen Reactions  . Dilaudid [Hydromorphone] Other (See Comments)    Per daughter cause confusion and hallucinations. Causes  confusion and hallucinations per pt's daughter  . Lorazepam Rash and Other (See Comments)    Other reaction(s): Hallucinations hallucinations  hallucinations  hallucinations   . Acyclovir And Related   . Amlodipine Besylate     Pedal edema  . Morphine And Related Itching  . Buprenorphine Hcl Itching    FAMILY HISTORY: Family History  Problem Relation Age of Onset  . Diabetes Mother   . Hypertension Mother   . Thyroid disease Mother     SOCIAL HISTORY: Social History   Social History  . Marital Status: Divorced    Spouse Name: N/A  . Number of Children: 2  . Years of Education: N/A   Occupational History  . Retired    Social History Main Topics  . Smoking status: Former Smoker    Types: Cigarettes  . Smokeless tobacco: Never Used  . Alcohol Use: 0.0 oz/week    0 Standard drinks or equivalent per week     Comment: occasional  . Drug Use: No  . Sexual Activity: Not on  file   Other Topics Concern  . Not on file   Social History Narrative    REVIEW OF SYSTEMS: Constitutional: No fevers, chills, or sweats, no generalized fatigue, change in appetite Eyes: No visual changes, double vision, eye pain Ear, nose and throat: No hearing loss, ear pain, nasal congestion, sore throat Cardiovascular: No chest pain, palpitations Respiratory:  No shortness of breath at rest or with exertion, wheezes GastrointestinaI: No nausea, vomiting, diarrhea, abdominal pain, fecal incontinence Genitourinary:  No dysuria, urinary retention or frequency Musculoskeletal:  No neck pain, back pain Integumentary: No rash, pruritus, skin lesions Neurological: as above Psychiatric: No depression, insomnia, anxiety Endocrine: No palpitations, fatigue, diaphoresis, mood swings, change in appetite, change in weight, increased thirst Hematologic/Lymphatic:  No anemia, purpura, petechiae. Allergic/Immunologic: no itchy/runny eyes, nasal congestion, recent allergic reactions, rashes  PHYSICAL  EXAM: Filed Vitals:   02/13/16 1418  BP: 142/84  Pulse: 82   General: No acute distress Head:  Normocephalic/atraumatic Neck: supple, no paraspinal tenderness, full range of motion Heart:  Regular rate and rhythm Lungs:  Clear to auscultation bilaterally Back: No paraspinal tenderness Skin/Extremities: No rash, no edema Neurological Exam: alert and oriented to person, place, and time. No aphasia or dysarthria. Fund of knowledge is appropriate.  Recent and remote memory are intact.  Attention and concentration are normal.    Able to name objects and repeat phrases. CDT 5/5 MMSE - Mini Mental State Exam 02/13/2016 07/25/2015  Orientation to time 5 5  Orientation to Place 5 5  Registration 3 3  Attention/ Calculation 5 5  Recall 2 0  Language- name 2 objects 2 2  Language- repeat 1 1  Language- follow 3 step command 3 3  Language- read & follow direction 1 1  Write a sentence 1 1  Copy design 1 1  Total score 29 27   Cranial nerves: Pupils equal, round, reactive to light. Extraocular movements intact with no nystagmus. Visual fields full. Facial sensation intact. No facial asymmetry. Tongue, uvula, palate midline.  Motor: Bulk and tone normal, muscle strength 5/5 throughout with no pronator drift.  Sensation to light touch intact.  No extinction to double simultaneous stimulation.  Deep tendon reflexes 2+ throughout, toes downgoing.  Finger to nose testing intact.  Gait narrow-based and steady, able to tandem walk adequately.  Romberg negative.  IMPRESSION: This is a 75 yo RH woman with a history of hypertension, hyperlipidemia, depression, anxiety, fibromyalgia, who presented for confusion and memory changes. Her MMSE today is 29/30 (previously 27/30 in August 2016). She reports doing much better after medication adjustment during inpatient psychiatry stay in February. She reports mood is better but still a work in progress. We again discussed effects of mood on memory, she continues to  work with her psychiatrist and therapist. We again discussed the importance of control of vascular risk factors, physical exercise, and brain stimulation exercises for brain health. She will follow-up in 1 year and knows to call for any changes.   Thank you for allowing me to participate in her care.  Please do not hesitate to call for any questions or concerns.  The duration of this appointment visit was 25 minutes of face-to-face time with the patient.  Greater than 50% of this time was spent in counseling, explanation of diagnosis, planning of further management, and coordination of care.   Ellouise Newer, M.D.   CC: Dr. Etter Sjogren

## 2016-02-13 NOTE — Patient Instructions (Signed)
You look great! Continue working with your psychiatrist and therapist for the mood. Physical exercise and brain stimulation exercises have been found to be helpful for brain health. Follow-up in 1 year, call for any changes

## 2016-02-14 ENCOUNTER — Ambulatory Visit: Payer: Self-pay

## 2016-02-15 DIAGNOSIS — J189 Pneumonia, unspecified organism: Secondary | ICD-10-CM | POA: Diagnosis not present

## 2016-02-16 ENCOUNTER — Ambulatory Visit: Payer: Self-pay | Admitting: Family Medicine

## 2016-02-16 ENCOUNTER — Other Ambulatory Visit: Payer: Self-pay | Admitting: *Deleted

## 2016-02-16 NOTE — Patient Outreach (Signed)
[name]) Care Management  02/16/2016  Stephanie Mckenzie 1941/11/15 [id number]  Telephone Assessment  RN spoke with pt today and introduced the community case management services and purpose of today's call.  RN inquired on pt's possible needs as pt reports she needs help in managing her diabetes. Pt admits that she does not take her CBG daily and has gained several pounds over the last few months. Pt admits she has a glucometer and will to make a change to improve her management of care. Pt is aware that this is due to her dietary habits but still receptive to diabetes educations. RN discussed diabetes and how this medical condition can affect other medical problems if not monitored correctly. Pt verbalized an understanding and willing ot make some changes to better monitor this condition. RN inquired on educational material for possible videos and/or printed material as pt verbalized she would prefer printed material to review. RN has informed pt concerning her weight gain with option to consult a dietiation or a nutritionist for a more constructive eating plan however RN able to discussed some health eating habits related to her diabetes and how carbohydrates plays a factor in contributing to her possible weight gained.  Falls were another concern however pt reports the two falls she recent had she felt asleep and fell of the stool and commode. RN discussed home safety evaluate however strongly encouraged pt to use precautionary measures with possible rales beside her commode and avoid sitting on stools by using chairs with side handles if necessary for safety precautions.  Pt also reports her issues with medications has been resolved with the help of her sone who completed her medication planner after [facility] has discharged her from services. RN explained to pt that once she is taught how to completed such task [facility] will usually discharge pt from their care. RN encouraged pt to  observe her son concerning her pill planner and attempt to learn if needed to completed in the future. Pt has opt to decline pharmacy at this time and her medications have reduced from 20 to 12 medications. RN discussed a possible plan of care that would involved goals that she should be able to accomplish and inquired if pt would be willing to make such changes ( pt has agreed and receptive to a home visit). RN scheduled a home visit for next week based upon pt's acceptance. Will proceed with enrollment into the diabetic program for case management services.   [name], RN Care Management Coordinator [place] Office [phone]

## 2016-02-21 ENCOUNTER — Other Ambulatory Visit: Payer: Self-pay | Admitting: *Deleted

## 2016-02-21 ENCOUNTER — Telehealth: Payer: Self-pay | Admitting: Family Medicine

## 2016-02-21 ENCOUNTER — Encounter: Payer: Self-pay | Admitting: *Deleted

## 2016-02-21 VITALS — BP 118/70 | HR 68 | Resp 20 | Ht 64.0 in | Wt 220.0 lb

## 2016-02-21 DIAGNOSIS — E118 Type 2 diabetes mellitus with unspecified complications: Secondary | ICD-10-CM

## 2016-02-21 NOTE — Patient Outreach (Addendum)
[facility]) Care Management   02/21/2016  Stephanie Mckenzie 10-10-1941 [id number]  Stephanie Mckenzie is an 75 y.o. female  Subjective: Pt remains receptive to enrolling into the [facility] program and services DM: Pt states she needs education on diabetes because she has never received it in the pass. States she has not been taking her CBG because "no one has told her to monitor her blood sugars". Pt not aware of her last A1C and really is not aware of how diabetes can affect her and her other medical conditions. MEDICATIONS: Pt states she takes a lot of medications and confused on several that are the same but she is only taking one of the two. Pt states she has two different doses of Metoprolol and Mirtazapine. Other issues present is that it is not taking her Metformin noted in EPIC and does not have this medication. NUTRITION: Pt states she is not a big eater and only eats about two meals a day. States she may snack but really not aware of what to snack on as a diabetic. Reports eating Hummus and crackles as the only meal for today at 4:30pm. Pt states most days she is not that hungry but willing to try to develop more heathy eating habits.  Objective:   Review of Systems  Constitutional: Negative.   HENT: Negative.   Eyes: Negative.   Respiratory: Negative.   Cardiovascular: Negative.   Gastrointestinal: Negative.   Genitourinary: Negative.   Musculoskeletal: Negative.   Skin: Negative.   Neurological: Negative.   Endo/Heme/Allergies: Negative.   Psychiatric/Behavioral: Positive for depression.       History of depression and pt visits a psychiatrist (pending appointment tomorrow)    Physical Exam  Constitutional: She is oriented to person, place, and time. She appears well-developed and well-nourished.  HENT:  Right Ear: External ear normal.  Left Ear: External ear normal.  Neck: Normal range of motion.  Cardiovascular: Normal heart sounds.   Respiratory: Effort normal  and breath sounds normal.  GI: Soft. Bowel sounds are normal.  Musculoskeletal: Normal range of motion.  Neurological: She is alert and oriented to person, place, and time.  Skin: Skin is warm and dry.  Psychiatric: She has a normal mood and affect. Her behavior is normal. Judgment and thought content normal.    Current Medications:   Current Outpatient Prescriptions  Medication Sig Dispense Refill  . aspirin 81 MG tablet Take 81 mg by mouth daily.    Marland Kitchen atorvastatin (LIPITOR) 40 MG tablet Take 1 tablet (40 mg total) by mouth daily. 90 tablet 1  . cholecalciferol (VITAMIN D) 1000 UNITS tablet Take 2,000 Units by mouth.    . Collagen-Vitamin C (COLLAGEN PLUS VITAMIN C PO) Take by mouth.    . diltiazem (CARDIZEM CD) 180 MG 24 hr capsule Take 1 capsule (180 mg total) by mouth daily. 90 capsule 1  . FLUoxetine (PROZAC) 40 MG capsule Take 1 capsule by mouth daily.    . folic acid (FOLVITE) 1 MG tablet Take 1 tablet (1 mg total) by mouth daily. 30 tablet 5  . furosemide (LASIX) 20 MG tablet TAKE ONE TABLET BY MOUTH ONCE DAILY AS NEEDED FOR EDEMA 15 tablet 1  . glipiZIDE (GLUCOTROL) 5 MG tablet Take 1 tablet (5 mg total) by mouth daily before breakfast. 30 tablet 5  . metoprolol succinate (TOPROL XL) 50 MG 24 hr tablet Take 1 tablet (50 mg total) by mouth 2 (two) times daily. 60 tablet 5  .  mirtazapine (REMERON) 45 MG tablet Take 1 tablet (45 mg total) by mouth at bedtime. Cvejin-psych 30 tablet 0  . omeprazole (PRILOSEC) 40 MG capsule TAKE ONE CAPSULE BY MOUTH ONCE DAILY 30 capsule 11  . potassium chloride (K-DUR) 10 MEQ tablet Take 1 tablet (10 mEq total) by mouth 3 (three) times daily. 90 tablet 1  . torsemide (DEMADEX) 20 MG tablet 2 po qd 60 tablet 2  . vitamin B-12 (CYANOCOBALAMIN) 1000 MCG tablet Take 1,000 mcg by mouth daily.    Marland Kitchen zolpidem (AMBIEN) 5 MG tablet Take 5 mg by mouth once.    . metFORMIN (GLUCOPHAGE) 500 MG tablet Take 1 tablet (500 mg total) by mouth 2 (two) times daily with a  meal. (Patient not taking: Reported on 02/21/2016) 60 tablet 5   No current facility-administered medications for this visit.    Functional Status:   In your present state of health, do you have any difficulty performing the following activities: 02/21/2016 09/02/2015  Hearing? [name]  Vision? N Y  Difficulty concentrating or making decisions? N Y  Walking or climbing stairs? N Y  Dressing or bathing? N N  Doing errands, shopping? N -  Preparing Food and eating ? N -  Using the Toilet? N -  In the past six months, have you accidently leaked urine? N -  Do you have problems with loss of bowel control? N -  Managing your Medications? N -  Managing your Finances? N -  Housekeeping or managing your Housekeeping? N -    Fall/Depression Screening:    PHQ 2/9 Scores 02/21/2016 02/10/2016 06/17/2015 05/26/2015  PHQ - 2 Score 2 2 6 6   PHQ- 9 Score - 8 15 19    Filed Vitals:   02/21/16 1449  BP: 118/70  Pulse: 68  Resp: 20    Assessment:   Introduce [facility] for enrollment Case management related to diabetes Medication procurement and education Nutrition issues related to healthy eating habits  Plan:  Will introduce the [facility] program and services and obtain a signed consent [facility] enrollment. Will complete physical assessment with noted issues and interventions performed. Will began education on diabetes and discuss the importance of monitoring her medical condition and the effect of how diabetes will impact her if not monitored with increased CBG and A1C (pt with understanding). Will provide printable information and review with pt for increasing her lack of knowledge base related to his medical condition. Emmi will be provided for Diabetes as followed: DM:why check your blood sugar, DM: why check your A1C and DM: controlling blood sugar. Also provided tools for daily monitoring and documentation with the [facility] calendar tool which allow pt to document all readings. Also perform a random CBG today with  reading at 89. Will review all medication and findings were several discrepancies with two different doses of Metoprolol 50 mg  and 100 mg, two different doses of Mirtazapine 30 mg and 45 mg and Metformin noted on EPIC medication but pt not take and does not have this medication. Pt has a psychiatrist appointment on tomorrow and will inquired on the Remeron medication for her depression. RN will contact Dr. Etter Sjogren office to clarify the above medications (currently awaiting a call back however pt feels comfortable in explained the above for possible intervention on any changes). RN provided call back for pt and this RN to provider's office.  Will provide information on nutritional tools with carbohydrate counting and discussed the importance of a well balanced diet in managing her  diabetes. Will encouraged pt on her main meals and several small meals throughout the day in regulating her diabetes. Portion sizes discussed and a visual aide was provided.  Plan of care and goals discussed as pt has agreed and will attempt to adhere. No other inquires or request at this time as RN will schedule the next home visit and re-evaluate plan of care and adjust goals as needed.    [name], RN Care Management Coordinator [place] Office [phone]

## 2016-02-21 NOTE — Telephone Encounter (Signed)
I review in detail the medication list each time the patient is here, however she is seeing multiple providers that keep changing her med's as well as multiple ED visits. Please advise   KP

## 2016-02-21 NOTE — Telephone Encounter (Signed)
Let her know this is a problem we are having as well.  She is seeing mult providers with different systems.

## 2016-02-21 NOTE — Telephone Encounter (Signed)
Caller name:Lisa Zigmund Daniel (Nurse Case Manager)  Can be reached: [place] [phone]  Reason for call: Patient has duplicate meds with different doses. Request that someone calls her Lattie Haw) so that she can make sure of the correct dosages for the meds. States that you can call patient, but she prefer talking to someone directly.

## 2016-02-22 ENCOUNTER — Other Ambulatory Visit: Payer: Self-pay | Admitting: *Deleted

## 2016-02-22 DIAGNOSIS — F331 Major depressive disorder, recurrent, moderate: Secondary | ICD-10-CM | POA: Diagnosis not present

## 2016-02-22 NOTE — Patient Outreach (Signed)
[facility]) Care Management  02/22/2016  Stephanie Mckenzie [birth year] [id number]  RN spoke with Dr. Etter Sjogren office who indicated pt has several providers writing prescriptions and this confuses the pt with taking several of the same medications. Due to the risk of sign effects and duplication of several medications in the past RN requested to refer this pt to [facility] for procurement of all her medications. In additional to clarifying pt's current medications on what she is suppose to be administering. Detail will be sent to [place] to contact Dr. Etter Sjogren directly for clarification and intervention. RN has informed Dr. Nonda Lou office that Deanne Coffer would be in contact to assist furrther on this pt's needs.   [name], RN Care Management Coordinator [place] Office [phone]

## 2016-02-22 NOTE — Telephone Encounter (Signed)
Spoke with Stephanie Mckenzie and she stated the patient has her pills in pill boxes and many of the doses are incorrect based on her medication list. She is concerned and said she has sent a copy of the report, I reviewed the medication list that on Epic care everywhere from [facility] as well as ours and some of the med's differ in dose and quantity. Metformin, Remeron and Metoprolol are all concerns of Lisa's she is going to get the Pharmacist Dawn Pettus involved. The pharmacist will give you a call within the next few days.      KP

## 2016-02-23 NOTE — Telephone Encounter (Signed)
Great.  Thanks

## 2016-02-24 ENCOUNTER — Other Ambulatory Visit: Payer: Self-pay

## 2016-02-24 ENCOUNTER — Encounter: Payer: Self-pay | Admitting: Endocrinology

## 2016-02-24 ENCOUNTER — Telehealth: Payer: Self-pay | Admitting: Family Medicine

## 2016-02-24 ENCOUNTER — Ambulatory Visit (INDEPENDENT_AMBULATORY_CARE_PROVIDER_SITE_OTHER): Payer: PPO | Admitting: Endocrinology

## 2016-02-24 VITALS — BP 132/70 | HR 74 | Temp 98.3°F | Ht 64.0 in | Wt 225.0 lb

## 2016-02-24 DIAGNOSIS — E559 Vitamin D deficiency, unspecified: Secondary | ICD-10-CM

## 2016-02-24 LAB — VITAMIN D 25 HYDROXY (VIT D DEFICIENCY, FRACTURES): VITD: 24.75 ng/mL — ABNORMAL LOW (ref 30.00–100.00)

## 2016-02-24 MED ORDER — VITAMIN D (ERGOCALCIFEROL) 1.25 MG (50000 UNIT) PO CAPS: 50000.0000 [IU] | ORAL_CAPSULE | ORAL | Status: DC

## 2016-02-24 NOTE — Patient Instructions (Signed)
blood tests are requested for you today.  We'll let you know about the results. It is possible that the low vitamin-D is partially causing the high parathyroid level.  Today's test will tell us more.

## 2016-02-24 NOTE — Progress Notes (Signed)
Subjective:    Patient ID: Stephanie Mckenzie, female    DOB: March 14, 1941, 75 y.o.   MRN: [id number]  HPI Pt says she had surgery at [facility] for an elevated parathyroid level in approx 2010.  She was noted to have severely decreased vitamin-D level in approx 2012.  she fx left ankle (2013), with a fall.  No other bony fracture.  She has never had weight loss surgery.  She now takes ergocalciferol, 1000 units qd.  She has intermittent cramps in the legs, but no assoc numbness.   Past Medical History  Diagnosis Date  . Hypertension   . Migraine   . Depression   . Edema 10/09/2015    Past Surgical History  Procedure Laterality Date  . Cesarean section      x 2  . Appendectomy    . Parathyroidectomy    . Laminectomy    . Coronary angioplasty    . Abdominal hysterectomy      Social History   Social History  . Marital Status: Divorced    Spouse Name: N/A  . Number of Children: 2  . Years of Education: N/A   Occupational History  . Retired    Social History Main Topics  . Smoking status: Former Smoker    Types: Cigarettes  . Smokeless tobacco: Never Used  . Alcohol Use: 0.0 oz/week    0 Standard drinks or equivalent per week     Comment: occasional  . Drug Use: No  . Sexual Activity: Not on file   Other Topics Concern  . Not on file   Social History Narrative    Current Outpatient Prescriptions on File Prior to Visit  Medication Sig Dispense Refill  . aspirin 81 MG tablet Take 81 mg by mouth daily.    Marland Kitchen atorvastatin (LIPITOR) 40 MG tablet Take 1 tablet (40 mg total) by mouth daily. 90 tablet 1  . cholecalciferol (VITAMIN D) 1000 UNITS tablet Take 2,000 Units by mouth.    . Collagen-Vitamin C (COLLAGEN PLUS VITAMIN C PO) Take by mouth.    . diltiazem (CARDIZEM CD) 180 MG 24 hr capsule Take 1 capsule (180 mg total) by mouth daily. 90 capsule 1  . FLUoxetine (PROZAC) 40 MG capsule Take 1 capsule by mouth daily.    . folic acid (FOLVITE) 1 MG tablet Take 1 tablet (1 mg  total) by mouth daily. 30 tablet 5  . furosemide (LASIX) 20 MG tablet TAKE ONE TABLET BY MOUTH ONCE DAILY AS NEEDED FOR EDEMA 15 tablet 1  . glipiZIDE (GLUCOTROL) 5 MG tablet Take 1 tablet (5 mg total) by mouth daily before breakfast. 30 tablet 5  . metoprolol succinate (TOPROL XL) 50 MG 24 hr tablet Take 1 tablet (50 mg total) by mouth 2 (two) times daily. 60 tablet 5  . mirtazapine (REMERON) 45 MG tablet Take 1 tablet (45 mg total) by mouth at bedtime. Cvejin-psych 30 tablet 0  . omeprazole (PRILOSEC) 40 MG capsule TAKE ONE CAPSULE BY MOUTH ONCE DAILY 30 capsule 11  . potassium chloride (K-DUR) 10 MEQ tablet Take 1 tablet (10 mEq total) by mouth 3 (three) times daily. 90 tablet 1  . torsemide (DEMADEX) 20 MG tablet 2 po qd 60 tablet 2  . vitamin B-12 (CYANOCOBALAMIN) 1000 MCG tablet Take 1,000 mcg by mouth daily.    Marland Kitchen zolpidem (AMBIEN) 5 MG tablet Take 5 mg by mouth once.    . metFORMIN (GLUCOPHAGE) 500 MG tablet Take 1 tablet (500 mg total)  by mouth 2 (two) times daily with a meal. (Patient not taking: Reported on 02/21/2016) 60 tablet 5   No current facility-administered medications on file prior to visit.    Allergies  Allergen Reactions  . Dilaudid [Hydromorphone] Other (See Comments)    Per daughter cause confusion and hallucinations. Causes confusion and hallucinations per pt's daughter  . Lorazepam Rash and Other (See Comments)    Other reaction(s): Hallucinations hallucinations  hallucinations  hallucinations   . Acyclovir And Related   . Amlodipine Besylate     Pedal edema  . Morphine And Related Itching  . Buprenorphine Hcl Itching    Family History  Problem Relation Age of Onset  . Diabetes Mother   . Hypertension Mother   . Thyroid disease Mother     BP 132/70 mmHg  Pulse 74  Temp(Src) 98.3 F (36.8 C) (Oral)  Ht 5\' 4"  (1.626 m)  Wt 225 lb (102.059 kg)  BMI 38.60 kg/m2  SpO2 94%  Review of Systems denies muscle weakness, n/v, syncope, cold intolerance,  palpitations, rash, diarrhea, sob, hair loss, seizure, fever, urinary frequency, rhinorrhea, easy bruising, and blurry vision.  She has intermittent diarrhea x many years (she says this has been eval, and has been excluded as a cause of vit-D deficiency). She has gained weight.  she has fatigue.      Objective:   Physical Exam VS: see vs page GEN: no distress HEAD: head: no deformity eyes: no periorbital swelling, no proptosis external nose and ears are normal mouth: no lesion seen NECK: a healed scar is present.  i do not appreciate a nodule in the thyroid or elsewhere in the neck CHEST WALL: no deformity LUNGS:  Clear to auscultation CV: reg rate and rhythm, no murmur ABD: abdomen is soft, nontender.  no hepatosplenomegaly.  not distended.  no hernia MUSCULOSKELETAL: muscle bulk and strength are grossly normal.  no obvious joint swelling.  gait is normal and steady EXTEMITIES: no deformity.  no ulcer on the feet.  feet are of normal color and temp.  1+ bilat leg edema.   PULSES: dorsalis pedis intact bilat.  no carotid bruit NEURO:  cn 2-12 grossly intact.   readily moves all 4's.  sensation is intact to touch on the feet SKIN:  Normal texture and temperature.  No rash or suspicious lesion is visible.   NODES:  None palpable at the neck PSYCH: alert, well-oriented.  Does not appear anxious nor depressed.  CXR (2016): no spinal fracture is noted  Lab Results  Component Value Date   TSH 1.51 01/26/2016   24-OH vit-D=25  I have reviewed outside records, and summarized: Pt was noted to have low vit-D, and referred here.       Assessment & Plan:  Vit-D deficiency, new to me Hyperparathyroidism, uncertain if primary or secondary, or both.  We'll have to follow in order to know.  Diarrhea: apparently unrelated to vit-D deficiency.  Patient is advised the following: Patient Instructions  blood tests are requested for you today.  We'll let you know about the results. It is  possible that the low vitamin-D is partially causing the high parathyroid level.  Today's test will tell us more.    (Dr Etter Sjogren has ordered DEXA--we'll await result) Addendum: i have sent a prescription to your pharmacy, for ergocalciferol

## 2016-02-24 NOTE — Patient Outreach (Signed)
I called Dr. Etter Sjogren and discussed Ms. Turpin with her.  I discussed my findings during the chart review and stated I would make a home visit next week if possible.  I stated I was able to determine many of her medications were stopped during her hospitalization at [facility].  I will confirm this when I make the home visit.  I will update [name], RN, nurse care manager as well.    Deanne Coffer, PharmD, [place] [phone].

## 2016-02-24 NOTE — Patient Outreach (Signed)
I called Stephanie Mckenzie to set up a home visit to review her medications.  I had to leave a HIPPA compliant message for her to call me back.  I will reach out to her again next week.   Deanne Coffer, PharmD, [place] [phone]

## 2016-02-24 NOTE — Telephone Encounter (Signed)
error:[id number] ° °

## 2016-02-27 ENCOUNTER — Other Ambulatory Visit: Payer: Self-pay

## 2016-02-27 NOTE — Patient Outreach (Signed)
[facility]) Care Management  [place]   02/27/2016  Stephanie Mckenzie 1941/10/31 [id number]  Subjective: Stephanie Mckenzie is a 75 year old female who was referred to pharmacy for medication management and review.  She was recently discharged from [facility] in February due to oversedation and depression.  She was taken off many of her medications that were leading to her sedation.  She has some questions in regards to whether or not she is supposed to be on some medications.  She also is not sure of some of the doses of her medications.  When asked how many times she misses her medications, she stated she only misses her medications only once a week.   I have completely reviewed the EPIC, [facility] records, [facility] records, [facility] records and [facility] records to determine the medications and the doses of medications she should be on currently.    Objective:   Encounter Medications: Outpatient Encounter Prescriptions as of 02/27/2016  Medication Sig Note  . aspirin 81 MG tablet Take 81 mg by mouth daily.   Marland Kitchen atorvastatin (LIPITOR) 40 MG tablet Take 1 tablet (40 mg total) by mouth daily.   Marland Kitchen diltiazem (CARDIZEM CD) 180 MG 24 hr capsule Take 1 capsule (180 mg total) by mouth daily.   Marland Kitchen FLUoxetine (PROZAC) 40 MG capsule Take 1 capsule by mouth daily. 11/15/2015: Received from: [place]: Take 1 daily  . folic acid (FOLVITE) 1 MG tablet Take 1 tablet (1 mg total) by mouth daily.   . furosemide (LASIX) 20 MG tablet TAKE ONE TABLET BY MOUTH ONCE DAILY AS NEEDED FOR EDEMA   . glipiZIDE (GLUCOTROL) 5 MG tablet Take 1 tablet (5 mg total) by mouth daily before breakfast.   . metoprolol succinate (TOPROL XL) 50 MG 24 hr tablet Take 1 tablet (50 mg total) by mouth 2 (two) times daily.   . mirtazapine (REMERON) 30 MG tablet Take 30 mg by mouth at bedtime.   Marland Kitchen omeprazole (PRILOSEC) 40 MG capsule Take 40 mg by mouth 2 (two) times daily.    . potassium chloride (K-DUR) 10 MEQ tablet Take 1 tablet (10 mEq total) by mouth 3 (three) times daily. (Patient taking differently: Take 10 mEq by mouth 2 (two) times daily. ) 02/27/2016: Could not remember to take the middle of the day dose due to being the only medication she took at that time  . torsemide (DEMADEX) 20 MG tablet 2 po qd   . vitamin B-12 (CYANOCOBALAMIN) 1000 MCG tablet Take 1,000 mcg by mouth daily. 05/26/2015: Received from: [place]: Take 1,000 mcg by mouth.  . Vitamin D, Ergocalciferol, (DRISDOL) 50000 units CAPS capsule Take 1 capsule (50,000 Units total) by mouth 2 (two) times a week.   . zolpidem (AMBIEN) 5 MG tablet Take 5 mg by mouth once.   . [DISCONTINUED] cholecalciferol (VITAMIN D) 1000 UNITS tablet Take 2,000 Units by mouth. 09/22/2015: Received from: [place]  . [DISCONTINUED] Collagen-Vitamin C (COLLAGEN PLUS VITAMIN C PO) Take by mouth.   . [DISCONTINUED] metFORMIN (GLUCOPHAGE) 500 MG tablet Take 1 tablet (500 mg total) by mouth 2 (two) times daily with a meal. (Patient not taking: Reported on 02/21/2016)   . [DISCONTINUED] mirtazapine (REMERON) 45 MG tablet Take 1 tablet (45 mg total) by mouth at bedtime. Cvejin-psych   . [DISCONTINUED] omeprazole (PRILOSEC) 40 MG capsule TAKE ONE CAPSULE BY MOUTH ONCE DAILY    No facility-administered encounter  medications on file as of 02/27/2016.    Functional Status: In your present state of health, do you have any difficulty performing the following activities: 02/21/2016 09/02/2015  Hearing? [name]  Vision? N Y  Difficulty concentrating or making decisions? N Y  Walking or climbing stairs? N Y  Dressing or bathing? N N  Doing errands, shopping? N -  Preparing Food and eating ? N -  Using the Toilet? N -  In the past six months, have you accidently leaked urine? N -  Do you have problems with loss of bowel control? N -  Managing your Medications? N -  Managing your Finances? N -  Housekeeping or  managing your Housekeeping? N -    Fall/Depression Screening: PHQ 2/9 Scores 02/21/2016 02/10/2016 06/17/2015 05/26/2015  PHQ - 2 Score 2 2 6 6   PHQ- 9 Score - 8 15 19     Assessment:  Drugs sorted by system:  Neurologic/Psychologic: fluoxetine, mirtazapine, zolpidem  Cardiovascular: aspirin, atorvastatin, diltiazem, furosemide, metoprolol succinate, torsemide  Pulmonary/Allergy: none  Gastrointestinal: omeprazole  Endocrine: glipizide  Renal: none  Topical: none  Pain: none  Vitamins/Minerals: folic acid, potassium, vitamin B12, vitamin D  Infectious Diseases: none  Miscellaneous: none   Duplications in therapy: furosemide and torsemide:  Currently uses torsemide daily and only uses furosemide as needed.  Typically uses furosemide once a week.   Gaps in therapy:  Not on an ACE inhibitor or ARB.  Lisinopril recently stopped due to causing daily headaches.  She may warrant a trial of another ACE inhibitor or ARB for renal protection.  Medications to avoid in the elderly: Omeprazole:  Patient states this is the only agent that works for her.  Monitoring her for falls and fractures will be important.  Drug interactions: none  Other issues noted:    Adherence:  [name] does not seem to be adherent to some of her medications.  She had about 90 days of fluoxetine, 90 days of omeprazole and metoprolol which were filled in December 2016.  She will need to be monitored closely to determine how closely she is adherent to her medications.   Potassium:  She is only taking Potassium twice a day instead of three times a day.  This is her preference.  She cannot remember to take the middle of the day dose.    Metoprolol succinate: She is taking Metoprolol succinate twice a day.  She could take it once a day.  It is not clear why it was prescribed twice a day.  Her blood pressure seems to be in good control.  It could be changed to Metoprolol succinate 100 mg daily.    Plan: 1.  I  have review her medications and did not find any major drug interactions.  She is missing an ACE inhibitor or ARB in her diabetes regimen.  She may have a history of adverse effects from ACE inhibitors.  She may benefit from an ARB.   2.  She may not be adherent to her medications.  It is too early for me to determine her exact adherence.  I will monitor over the next couple of weeks to see her exact adherence and develop a plan to increase her adherence if necessary. 3.  I will recommend a 90 supply of her stable chronic medications to help her save money and help with adherence as well.   4.  I will send my recommendations to Dr. Etter Sjogren.  5.  I will follow up in  1 week to see how she is doing with her adherence and determine if any changes have occurred.    Deanne Coffer, PharmD, [place] [phone]

## 2016-02-28 ENCOUNTER — Ambulatory Visit (INDEPENDENT_AMBULATORY_CARE_PROVIDER_SITE_OTHER): Payer: PPO | Admitting: Family Medicine

## 2016-02-28 ENCOUNTER — Encounter: Payer: Self-pay | Admitting: Family Medicine

## 2016-02-28 VITALS — BP 140/66 | HR 70 | Temp 98.2°F | Wt 226.2 lb

## 2016-02-28 DIAGNOSIS — B354 Tinea corporis: Secondary | ICD-10-CM

## 2016-02-28 DIAGNOSIS — R82998 Other abnormal findings in urine: Secondary | ICD-10-CM

## 2016-02-28 DIAGNOSIS — R32 Unspecified urinary incontinence: Secondary | ICD-10-CM | POA: Diagnosis not present

## 2016-02-28 DIAGNOSIS — J324 Chronic pansinusitis: Secondary | ICD-10-CM

## 2016-02-28 DIAGNOSIS — N390 Urinary tract infection, site not specified: Secondary | ICD-10-CM | POA: Diagnosis not present

## 2016-02-28 LAB — POC URINALSYSI DIPSTICK (AUTOMATED)
BILIRUBIN UA: NEGATIVE
Blood, UA: NEGATIVE
Glucose, UA: NEGATIVE
Ketones, UA: NEGATIVE
NITRITE UA: NEGATIVE
PH UA: 5.5
PROTEIN UA: NEGATIVE
Spec Grav, UA: 1.02
UROBILINOGEN UA: 2

## 2016-02-28 MED ORDER — MIRABEGRON ER 50 MG PO TB24: 50.0000 mg | ORAL_TABLET | Freq: Every day | ORAL | Status: DC

## 2016-02-28 MED ORDER — NYSTATIN 100000 UNIT/GM EX POWD: CUTANEOUS | Status: DC

## 2016-02-28 MED ORDER — FLUTICASONE PROPIONATE 50 MCG/ACT NA SUSP: 2.0000 | Freq: Every day | NASAL | Status: AC

## 2016-02-28 MED ORDER — AMOXICILLIN-POT CLAVULANATE 875-125 MG PO TABS: 1.0000 | ORAL_TABLET | Freq: Two times a day (BID) | ORAL | Status: DC

## 2016-02-28 NOTE — Progress Notes (Signed)
Patient ID: Stephanie Mckenzie, female    DOB: June 14, 1941  Age: 75 y.o. MRN: [id number]    Subjective:  Subjective HPI Stephanie Mckenzie presents for f/u from [facility].  She is very happy with her med list being straightened out and very happy with [facility].   She is c/o sinus pressure and congestion.  No fever. She also c/o rash under breasts that is itchy.    Review of Systems  Constitutional: Negative for diaphoresis, appetite change, fatigue and unexpected weight change.  HENT: Positive for congestion and sinus pressure.   Eyes: Negative for pain, redness and visual disturbance.  Respiratory: Negative for cough, chest tightness, shortness of breath and wheezing.   Cardiovascular: Negative for chest pain, palpitations and leg swelling.  Endocrine: Negative for cold intolerance, heat intolerance, polydipsia, polyphagia and polyuria.  Genitourinary: Negative for dysuria, frequency and difficulty urinating.  Skin: Positive for rash.  Neurological: Negative for dizziness, light-headedness, numbness and headaches.    History Past Medical History  Diagnosis Date  . Hypertension   . Migraine   . Depression   . Edema 10/09/2015    She has past surgical history that includes Cesarean section; Appendectomy; Parathyroidectomy; Laminectomy; Coronary angioplasty; and Abdominal hysterectomy.   Her family history includes Diabetes in her mother; Hypertension in her mother; Thyroid disease in her mother.She reports that she has quit smoking. Her smoking use included Cigarettes. She has never used smokeless tobacco. She reports that she drinks alcohol. She reports that she does not use illicit drugs.  Current Outpatient Prescriptions on File Prior to Visit  Medication Sig Dispense Refill  . aspirin 81 MG tablet Take 81 mg by mouth daily.    Marland Kitchen atorvastatin (LIPITOR) 40 MG tablet Take 1 tablet (40 mg total) by mouth daily. 90 tablet 1  . diltiazem (CARDIZEM CD) 180 MG 24 hr capsule Take 1 capsule (180 mg total)  by mouth daily. 90 capsule 1  . FLUoxetine (PROZAC) 40 MG capsule Take 1 capsule by mouth daily.    . folic acid (FOLVITE) 1 MG tablet Take 1 tablet (1 mg total) by mouth daily. 30 tablet 5  . furosemide (LASIX) 20 MG tablet TAKE ONE TABLET BY MOUTH ONCE DAILY AS NEEDED FOR EDEMA 15 tablet 1  . glipiZIDE (GLUCOTROL) 5 MG tablet Take 1 tablet (5 mg total) by mouth daily before breakfast. 30 tablet 5  . metoprolol succinate (TOPROL XL) 50 MG 24 hr tablet Take 1 tablet (50 mg total) by mouth 2 (two) times daily. 60 tablet 5  . mirtazapine (REMERON) 30 MG tablet Take 30 mg by mouth at bedtime.    Marland Kitchen omeprazole (PRILOSEC) 40 MG capsule Take 40 mg by mouth 2 (two) times daily.    . potassium chloride (K-DUR) 10 MEQ tablet Take 1 tablet (10 mEq total) by mouth 3 (three) times daily. (Patient taking differently: Take 10 mEq by mouth 2 (two) times daily. ) 90 tablet 1  . torsemide (DEMADEX) 20 MG tablet 2 po qd 60 tablet 2  . vitamin B-12 (CYANOCOBALAMIN) 1000 MCG tablet Take 1,000 mcg by mouth daily.    . Vitamin D, Ergocalciferol, (DRISDOL) 50000 units CAPS capsule Take 1 capsule (50,000 Units total) by mouth 2 (two) times a week. 8 capsule 0  . zolpidem (AMBIEN) 5 MG tablet Take 5 mg by mouth once.     No current facility-administered medications on file prior to visit.     Objective:  Objective Physical Exam  Constitutional: She is oriented to  person, place, and time. She appears well-developed and well-nourished.  HENT:  Head: Normocephalic and atraumatic.  Right Ear: External ear normal.  Left Ear: External ear normal.  Nose: Right sinus exhibits maxillary sinus tenderness. Left sinus exhibits maxillary sinus tenderness.  + PND + errythema  Eyes: Conjunctivae and EOM are normal. Right eye exhibits no discharge. Left eye exhibits no discharge.  Neck: Normal range of motion. Neck supple. No JVD present. Carotid bruit is not present. No thyromegaly present.  Cardiovascular: Normal rate,  regular rhythm and normal heart sounds.   No murmur heard. Pulmonary/Chest: Effort normal and breath sounds normal. No respiratory distress. She has no wheezes. She has no rales. She exhibits no tenderness.  Musculoskeletal: She exhibits no edema.  Lymphadenopathy:    She has cervical adenopathy.  Neurological: She is alert and oriented to person, place, and time.  Psychiatric: She has a normal mood and affect.  Nursing note and vitals reviewed.  BP 140/66 mmHg  Pulse 70  Temp(Src) 98.2 F (36.8 C) (Oral)  Wt 226 lb 3.2 oz (102.604 kg)  SpO2 96% Wt Readings from Last 3 Encounters:  02/28/16 226 lb 3.2 oz (102.604 kg)  02/24/16 225 lb (102.059 kg)  02/21/16 220 lb (99.791 kg)     Lab Results  Component Value Date   WBC 7.7 01/26/2016   HGB 12.6 01/26/2016   HCT 38.6 01/26/2016   PLT 323.0 01/26/2016   GLUCOSE 119* 01/26/2016   CHOL 151 01/26/2016   TRIG 322.0* 01/26/2016   HDL 36.80* 01/26/2016   LDLDIRECT 83.0 01/26/2016   LDLCALC 83 05/26/2015   ALT 13 01/26/2016   AST 11 01/26/2016   NA 142 01/26/2016   K 3.7 01/26/2016   CL 110 01/26/2016   CREATININE 1.39* 01/26/2016   BUN 31* 01/26/2016   CO2 22 01/26/2016   TSH 1.51 01/26/2016   INR 0.88 12/27/2009   HGBA1C 6.5 11/15/2015    Mm Screening Breast Tomo Bilateral  01/25/2016  CLINICAL DATA:  Screening. EXAM: DIGITAL SCREENING BILATERAL MAMMOGRAM WITH 3D TOMO WITH CAD COMPARISON:  Previous exam(s). ACR Breast Density Category c: The breast tissue is heterogeneously dense, which may obscure small masses. FINDINGS: There are no findings suspicious for malignancy. Images were processed with CAD. IMPRESSION: No mammographic evidence of malignancy. A result letter of this screening mammogram will be mailed directly to the patient. RECOMMENDATION: Screening mammogram in one year. (Code:SM-B-01Y) BI-RADS CATEGORY  1: Negative. Electronically Signed   By: Nolon Nations M.D.   On: 01/25/2016 11:37     Assessment & Plan:    Plan I am having Stephanie Mckenzie start on nystatin, mirabegron ER, amoxicillin-clavulanate, and fluticasone. I am also having her maintain her vitamin 0000000, folic acid, torsemide, FLUoxetine, atorvastatin, diltiazem, aspirin, potassium chloride, glipiZIDE, metoprolol succinate, furosemide, zolpidem, Vitamin D (Ergocalciferol), mirtazapine, and omeprazole.  Meds ordered this encounter  Medications  . nystatin (MYCOSTATIN/NYSTOP) 100000 UNIT/GM POWD    Sig: Apply up to qid    Dispense:  60 g    Refill:  1  . mirabegron ER (MYRBETRIQ) 50 MG TB24 tablet    Sig: Take 1 tablet (50 mg total) by mouth daily.    Dispense:  30 tablet    Refill:  5  . amoxicillin-clavulanate (AUGMENTIN) 875-125 MG tablet    Sig: Take 1 tablet by mouth 2 (two) times daily.    Dispense:  20 tablet    Refill:  0  . fluticasone (FLONASE) 50 MCG/ACT nasal spray  Sig: Place 2 sprays into both nostrils daily.    Dispense:  16 g    Refill:  6    Problem List Items Addressed This Visit    None    Visit Diagnoses    Urinary incontinence, unspecified incontinence type    -  Primary    Relevant Medications    mirabegron ER (MYRBETRIQ) 50 MG TB24 tablet    amoxicillin-clavulanate (AUGMENTIN) 875-125 MG tablet    Other Relevant Orders    POCT Urinalysis Dipstick (Automated) (Completed)    Urine Culture (Completed)    Leukocytes in urine        Relevant Orders    Urine Culture (Completed)    Tinea corporis        Relevant Medications    nystatin (MYCOSTATIN/NYSTOP) 100000 UNIT/GM POWD    Pansinusitis, unspecified chronicity        Relevant Medications    amoxicillin-clavulanate (AUGMENTIN) 875-125 MG tablet    fluticasone (FLONASE) 50 MCG/ACT nasal spray       Follow-up: Return in about 1 week (around 03/06/2016) for irrigate ears.  Ann Held, DO

## 2016-02-28 NOTE — Patient Instructions (Signed)
Use debrox in both ears for 1 week and if needed call to come back in to have ears irrigated       Sinusitis, Adult Sinusitis is redness, soreness, and inflammation of the paranasal sinuses. Paranasal sinuses are air pockets within the bones of your face. They are located beneath your eyes, in the middle of your forehead, and above your eyes. In healthy paranasal sinuses, mucus is able to drain out, and air is able to circulate through them by way of your nose. However, when your paranasal sinuses are inflamed, mucus and air can become trapped. This can allow bacteria and other germs to grow and cause infection. Sinusitis can develop quickly and last only a short time (acute) or continue over a long period (chronic). Sinusitis that lasts for more than 12 weeks is considered chronic. CAUSES Causes of sinusitis include:  Allergies.  Structural abnormalities, such as displacement of the cartilage that separates your nostrils (deviated septum), which can decrease the air flow through your nose and sinuses and affect sinus drainage.  Functional abnormalities, such as when the small hairs (cilia) that line your sinuses and help remove mucus do not work properly or are not present. SIGNS AND SYMPTOMS Symptoms of acute and chronic sinusitis are the same. The primary symptoms are pain and pressure around the affected sinuses. Other symptoms include:  Upper toothache.  Earache.  Headache.  Bad breath.  Decreased sense of smell and taste.  A cough, which worsens when you are lying flat.  Fatigue.  Fever.  Thick drainage from your nose, which often is green and may contain pus (purulent).  Swelling and warmth over the affected sinuses. DIAGNOSIS Your health care provider will perform a physical exam. During your exam, your health care provider may perform any of the following to help determine if you have acute sinusitis or chronic sinusitis:  Look in your nose for signs of abnormal  growths in your nostrils (nasal polyps).  Tap over the affected sinus to check for signs of infection.  View the inside of your sinuses using an imaging device that has a light attached (endoscope). If your health care provider suspects that you have chronic sinusitis, one or more of the following tests may be recommended:  Allergy tests.  Nasal culture. A sample of mucus is taken from your nose, sent to a lab, and screened for bacteria.  Nasal cytology. A sample of mucus is taken from your nose and examined by your health care provider to determine if your sinusitis is related to an allergy. TREATMENT Most cases of acute sinusitis are related to a viral infection and will resolve on their own within 10 days. Sometimes, medicines are prescribed to help relieve symptoms of both acute and chronic sinusitis. These may include pain medicines, decongestants, nasal steroid sprays, or saline sprays. However, for sinusitis related to a bacterial infection, your health care provider will prescribe antibiotic medicines. These are medicines that will help kill the bacteria causing the infection. Rarely, sinusitis is caused by a fungal infection. In these cases, your health care provider will prescribe antifungal medicine. For some cases of chronic sinusitis, surgery is needed. Generally, these are cases in which sinusitis recurs more than 3 times per year, despite other treatments. HOME CARE INSTRUCTIONS  Drink plenty of water. Water helps thin the mucus so your sinuses can drain more easily.  Use a humidifier.  Inhale steam 3-4 times a day (for example, sit in the bathroom with the shower running).  Apply  a warm, moist washcloth to your face 3-4 times a day, or as directed by your health care provider.  Use saline nasal sprays to help moisten and clean your sinuses.  Take medicines only as directed by your health care provider.  If you were prescribed either an antibiotic or antifungal medicine,  finish it all even if you start to feel better. SEEK IMMEDIATE MEDICAL CARE IF:  You have increasing pain or severe headaches.  You have nausea, vomiting, or drowsiness.  You have swelling around your face.  You have vision problems.  You have a stiff neck.  You have difficulty breathing.   This information is not intended to replace advice given to you by your health care provider. Make sure you discuss any questions you have with your health care provider.   Document Released: 11/12/2005 Document Revised: 12/03/2014 Document Reviewed: 11/27/2011 Elsevier Interactive Patient Education [employer].

## 2016-02-28 NOTE — Progress Notes (Signed)
Pre visit review using our clinic review tool, if applicable. No additional management support is needed unless otherwise documented below in the visit note. 

## 2016-02-29 ENCOUNTER — Telehealth: Payer: Self-pay | Admitting: Family Medicine

## 2016-02-29 NOTE — Telephone Encounter (Signed)
Pt called in because she says that PCP was prescribing a medication for her bladder leakage. Pt says that she went to pharmacy for pickup but medication wasn't there. Please assist and advise pt further.     Thanks.   [phone]

## 2016-02-29 NOTE — Telephone Encounter (Signed)
Spoke with pharmacy. Insurance requires step therapy and we will need to call [phone] (ID# [id number]). Spoke with Sunday Spillers at [employer] and was told that formulary will cover oxybutynin ER 10 or 15mg  or Tolterodine cap 2 or 4mg  or tablets for 1 or 2 mg.  Please advise if one of these would be an appropriate alternative?

## 2016-03-01 LAB — URINE CULTURE: Colony Count: 25000

## 2016-03-01 MED ORDER — OXYBUTYNIN CHLORIDE ER 15 MG PO TB24: 15.0000 mg | ORAL_TABLET | Freq: Every day | ORAL | Status: DC

## 2016-03-01 NOTE — Telephone Encounter (Signed)
Rx faxed.    KP 

## 2016-03-01 NOTE — Telephone Encounter (Signed)
Patient aware Rx has been faxed.      KP 

## 2016-03-01 NOTE — Telephone Encounter (Signed)
oxybutin er 15 mg daily #30

## 2016-03-05 ENCOUNTER — Other Ambulatory Visit: Payer: Self-pay

## 2016-03-05 NOTE — Patient Outreach (Signed)
[facility]) Care Management  [place]   03/05/2016  SEDA TISCHNER 1940/12/26 [id number]  Subjective: Stephanie Mckenzie is a 75 year old female who was referred to pharmacy for medication management and review. This is my follow up visit after her recent office visit with her provider.  She has started three new medications: Augmentin, Nystatin, and Oxybutynin ER.  She is tolerating all three very well.  She stated she feels she is doing much better with her medications although she still feels like she is taking a lot.  I went over each of her medications and explained why she is on each of them and why she could not be taken off of them currently.  I did not find a clear reason for Folic Acid.   We discussed in length her need to monitor her blood sugars and blood pressure to have more evidence to have discussions around being able to lower doses of medications or being able to stop medications.  She is most interested in stopping her Glipizide.  I stressed that her A1c was elevated although not above 7%.  Even though the two readings were 85, she is taking Glipizide which is one reason her blood sugar is so good.  I asked her to monitor more often to give Dr. Etter Sjogren more data to go off of.  She stated she would.  I was able to watch her fill her pill box.  She did an excellent job.  She has no issues filling her box.      Objective:   Encounter Medications: Outpatient Encounter Prescriptions as of 03/05/2016  Medication Sig Note  . amoxicillin-clavulanate (AUGMENTIN) 875-125 MG tablet Take 1 tablet by mouth 2 (two) times daily.   Marland Kitchen aspirin 81 MG tablet Take 81 mg by mouth daily.   Marland Kitchen atorvastatin (LIPITOR) 40 MG tablet Take 1 tablet (40 mg total) by mouth daily.   Marland Kitchen diltiazem (CARDIZEM CD) 180 MG 24 hr capsule Take 1 capsule (180 mg total) by mouth daily.   Marland Kitchen FLUoxetine (PROZAC) 40 MG capsule Take 1 capsule by mouth daily. 11/15/2015: Received from: [place]:  Take 1 daily  . fluticasone (FLONASE) 50 MCG/ACT nasal spray Place 2 sprays into both nostrils daily.   . folic acid (FOLVITE) 1 MG tablet Take 1 tablet (1 mg total) by mouth daily.   . furosemide (LASIX) 20 MG tablet TAKE ONE TABLET BY MOUTH ONCE DAILY AS NEEDED FOR EDEMA   . glipiZIDE (GLUCOTROL) 5 MG tablet Take 1 tablet (5 mg total) by mouth daily before breakfast.   . metoprolol succinate (TOPROL XL) 50 MG 24 hr tablet Take 1 tablet (50 mg total) by mouth 2 (two) times daily.   . mirtazapine (REMERON) 30 MG tablet Take 30 mg by mouth at bedtime.   Marland Kitchen nystatin (MYCOSTATIN/NYSTOP) 100000 UNIT/GM POWD Apply up to qid   . omeprazole (PRILOSEC) 40 MG capsule Take 40 mg by mouth 2 (two) times daily.   Marland Kitchen oxybutynin (DITROPAN XL) 15 MG 24 hr tablet Take 1 tablet (15 mg total) by mouth daily.   . potassium chloride (K-DUR) 10 MEQ tablet Take 1 tablet (10 mEq total) by mouth 3 (three) times daily. (Patient taking differently: Take 10 mEq by mouth 2 (two) times daily. ) 02/27/2016: Could not remember to take the middle of the day dose due to being the only medication she took at that time  . torsemide (DEMADEX) 20 MG tablet 2 po  qd   . vitamin B-12 (CYANOCOBALAMIN) 1000 MCG tablet Take 1,000 mcg by mouth daily. 05/26/2015: Received from: [place]: Take 1,000 mcg by mouth.  . Vitamin D, Ergocalciferol, (DRISDOL) 50000 units CAPS capsule Take 1 capsule (50,000 Units total) by mouth 2 (two) times a week.   . zolpidem (AMBIEN) 5 MG tablet Take 5 mg by mouth once.   . [DISCONTINUED] mirabegron ER (MYRBETRIQ) 50 MG TB24 tablet Take 1 tablet (50 mg total) by mouth daily.    No facility-administered encounter medications on file as of 03/05/2016.    Functional Status: In your present state of health, do you have any difficulty performing the following activities: 02/21/2016 09/02/2015  Hearing? [name]  Vision? N Y  Difficulty concentrating or making decisions? N Y  Walking or climbing stairs? N Y   Dressing or bathing? N N  Doing errands, shopping? N -  Preparing Food and eating ? N -  Using the Toilet? N -  In the past six months, have you accidently leaked urine? N -  Do you have problems with loss of bowel control? N -  Managing your Medications? N -  Managing your Finances? N -  Housekeeping or managing your Housekeeping? N -    Fall/Depression Screening: PHQ 2/9 Scores 02/21/2016 02/10/2016 06/17/2015 05/26/2015  PHQ - 2 Score 2 2 6 6   PHQ- 9 Score - 8 15 19     Assessment: 1.  Medication adherence:  She is doing well with adherence.  She was 100% adherent to her medications this week.  She was able to fill her pill box 100% correctly as well.   2.  Medication knowledge:  She has a good understanding of why she is on her medications.  She understands she needs one person in charge of her medications and not multiple providers prescribing the same medications for her.   Plan: 1.  I will reach out to Dr. Etter Sjogren to get clarification on the reason for Folic Acid.  If possible, I will see if it can be discontinued if not clinically needed.  2.  I reminded Ms. Koranda to call and make an appointment to follow up with Dr. Etter Sjogren regarding irrigation of her ears.  She is still complaining of her ears being clogged.  3.  I will follow up in 1 month to determine her adherence and see if she has any further pharmacy needs.    THN CM Care Plan Problem One        Most Recent Value   Care Plan Problem One  Medication adherence   Role Documenting the Problem One  Clinical Pharmacist   Care Plan for Problem One  Active   THN CM Short Term Goal #1 (0-30 days)  Ms. Capozza will be adherent to her medications at least 80% of the time evident by patient report and refill history   THN CM Short Term Goal #1 Start Date  03/05/16   Interventions for Short Term Goal #1  I watched Ms. Akram fill up her pill box.  I discussed the importance of taking her medications correctly and on time.  I reviewed her  medications with her and she stated her understanding of them.       Deanne Coffer, PharmD, [place] [phone]

## 2016-03-12 ENCOUNTER — Other Ambulatory Visit: Payer: Self-pay | Admitting: Endocrinology

## 2016-03-12 NOTE — Telephone Encounter (Signed)
Please advise if this med needs to be refilled.

## 2016-03-15 ENCOUNTER — Other Ambulatory Visit: Payer: Self-pay | Admitting: *Deleted

## 2016-03-15 ENCOUNTER — Telehealth: Payer: Self-pay | Admitting: Family Medicine

## 2016-03-15 MED ORDER — ONETOUCH LANCETS MISC: Status: DC

## 2016-03-15 MED ORDER — GLUCOSE BLOOD VI STRP: ORAL_STRIP | Status: DC

## 2016-03-15 NOTE — Telephone Encounter (Signed)
Can be reached: [phone] Pharmacy: [facility]  Reason for call: Pt needing test strips and lancets sent in for her (using One Touch Verio). She said she has very few. She said she is testing 2-3/day. She needs it sent in today if possible.

## 2016-03-15 NOTE — Patient Outreach (Signed)
[facility]) Care Management  03/15/2016  SHENAY VOLTZ Jan 01, 1941 [id number]  Telephone follow up  RN spoke with pt concerning her supplies on lancets. Pt states she has readings for RN to review on next home visit but remains in need of needles for her ongoing monitoring. RN requested pt to contact her provider (Dr. Etter Sjogren) and request a call in to her local pharmacy for lancets. Pt indicated she would call and make the request and continue taking her BS and document her readings. No other issues or concerns at this time. Will follow up next week as scheduled.  [name], RN Care Management Coordinator [facility] Office [phone]

## 2016-03-15 NOTE — Telephone Encounter (Signed)
Rx sent 

## 2016-03-20 ENCOUNTER — Other Ambulatory Visit: Payer: Self-pay | Admitting: *Deleted

## 2016-03-20 NOTE — Patient Outreach (Signed)
[facility] Care Management   03/20/2016  Stephanie Mckenzie Oct 01, 1941 [id number]  Stephanie Mckenzie is an 75 y.o. female  Subjective:  DM: Pt discussed what has taken place over the last month indicating a recent refill of her test strips for her glucometer were incorrect and did not work for her meter. Pt states she has contacted her local pharmacy and they have corrected the problems. Pt states she will pick up her strips today and take her CBG 3 times weekly for available readings for this RN on the next home visit. Pt reports the last readings was 133 and denies any issues with low or high readings since RN last visit.  MEDICAL APPOINTMENTS: Pt confirms she has attended all medical appointments with no delays and has sufficient transportation for her upcoming appointments. MEDICATION: Pt states she has all her prescribed medications however issues with her pharmacy who provided her with the incorrect strips for her meter. States the pharmacy has the correct version and she will be picking them up today. Pt also prescribed a new medication for her urinary issues and taking without any side effects. Pt is aware of what the medication is and the purpose.  SWELLING: Pt has inquired on compression stockings and requested THN to provider. States she has a history of swelling and her doctor is aware. States it is not related to CAD and it does not happen on a regular bases.  Pt is aware that the version of compression stockings available via [facility] are with surgical compression.   Objective:   Review of Systems  Constitutional: Negative.   HENT: Negative.   Eyes: Negative.   Respiratory: Negative.   Cardiovascular: Negative.   Gastrointestinal: Negative.   Genitourinary: Negative.   Musculoskeletal: Negative.   Skin: Negative.   Neurological: Negative.   Endo/Heme/Allergies: Negative.   Psychiatric/Behavioral: Negative.     Physical Exam  Constitutional: She is oriented to  person, place, and time. She appears well-developed and well-nourished.  HENT:  Right Ear: External ear normal.  Left Ear: External ear normal.  Eyes: EOM are normal.  Neck: Normal range of motion. Neck supple.  Cardiovascular: Normal heart sounds.   Respiratory: Effort normal and breath sounds normal.  GI: Soft. Bowel sounds are normal.  Musculoskeletal: Normal range of motion.  Neurological: She is alert and oriented to person, place, and time.  Skin: Skin is warm and dry.  Psychiatric: She has a normal mood and affect. Her behavior is normal. Judgment and thought content normal.    Encounter Medications:   Outpatient Encounter Prescriptions as of 03/20/2016  Medication Sig Note  . aspirin 81 MG tablet Take 81 mg by mouth daily.   Marland Kitchen atorvastatin (LIPITOR) 40 MG tablet Take 1 tablet (40 mg total) by mouth daily.   Marland Kitchen diltiazem (CARDIZEM CD) 180 MG 24 hr capsule Take 1 capsule (180 mg total) by mouth daily.   Marland Kitchen FLUoxetine (PROZAC) 40 MG capsule Take 1 capsule by mouth daily. 11/15/2015: Received from: [place]: Take 1 daily  . fluticasone (FLONASE) 50 MCG/ACT nasal spray Place 2 sprays into both nostrils daily.   . folic acid (FOLVITE) 1 MG tablet Take 1 tablet (1 mg total) by mouth daily.   . furosemide (LASIX) 20 MG tablet TAKE ONE TABLET BY MOUTH ONCE DAILY AS NEEDED FOR EDEMA   . glipiZIDE (GLUCOTROL) 5 MG tablet Take 1 tablet (5 mg total) by mouth daily before breakfast.   . glucose blood  test strip Check blood sugar no more than three times daily.   . metoprolol succinate (TOPROL XL) 50 MG 24 hr tablet Take 1 tablet (50 mg total) by mouth 2 (two) times daily.   . mirtazapine (REMERON) 30 MG tablet Take 30 mg by mouth at bedtime.   Marland Kitchen nystatin (MYCOSTATIN/NYSTOP) 100000 UNIT/GM POWD Apply up to qid   . omeprazole (PRILOSEC) 40 MG capsule Take 40 mg by mouth 2 (two) times daily.   . ONE TOUCH LANCETS MISC Check blood sugar no more than three times daily   .  oxybutynin (DITROPAN XL) 15 MG 24 hr tablet Take 1 tablet (15 mg total) by mouth daily.   . potassium chloride (K-DUR) 10 MEQ tablet Take 1 tablet (10 mEq total) by mouth 3 (three) times daily. (Patient taking differently: Take 10 mEq by mouth 2 (two) times daily. ) 02/27/2016: Could not remember to take the middle of the day dose due to being the only medication she took at that time  . torsemide (DEMADEX) 20 MG tablet 2 po qd   . vitamin B-12 (CYANOCOBALAMIN) 1000 MCG tablet Take 1,000 mcg by mouth daily. 05/26/2015: Received from: [place]: Take 1,000 mcg by mouth.  . Vitamin D, Ergocalciferol, (DRISDOL) 50000 units CAPS capsule TAKE ONE CAPSULE BY MOUTH TWICE A WEEK   . zolpidem (AMBIEN) 5 MG tablet Take 5 mg by mouth once.   Marland Kitchen amoxicillin-clavulanate (AUGMENTIN) 875-125 MG tablet Take 1 tablet by mouth 2 (two) times daily. (Patient not taking: Reported on 03/20/2016)    No facility-administered encounter medications on file as of 03/20/2016.    Functional Status:   In your present state of health, do you have any difficulty performing the following activities: 02/21/2016 09/02/2015  Hearing? [name]  Vision? N Y  Difficulty concentrating or making decisions? N Y  Walking or climbing stairs? N Y  Dressing or bathing? N N  Doing errands, shopping? N -  Preparing Food and eating ? N -  Using the Toilet? N -  In the past six months, have you accidently leaked urine? N -  Do you have problems with loss of bowel control? N -  Managing your Medications? N -  Managing your Finances? N -  Housekeeping or managing your Housekeeping? N -    Fall/Depression Screening:    PHQ 2/9 Scores 02/21/2016 02/10/2016 06/17/2015 05/26/2015  PHQ - 2 Score 2 2 6 6   PHQ- 9 Score - 8 15 19   BP 122/74 mmHg  Pulse 71  Resp 20  Ht 1.626 m (5\' 4" )  Wt 222 lb (100.699 kg)  BMI 38.09 kg/m2  SpO2 98%  Assessment:   Ongoing case management related to diabetes Ongoing healthier eating habits related to  diabetes Follow up on adherence with all medical appointments Follow up on adherence to all prescribed medications Bilateral swelling related to lower legs  Plan:  Will complete a physical assessment and verified pt adherence with her CBGs. Based upon pt's delay with provisions of the incorrect strips will allow extend of 30 days for pt to refill on the correct strips. Will continue to encouraged adherence with her CBG several days weekly. Will verify pt's awareness of hypo-hyperglycemia and what to do if acute symptoms should occur.  Will discussed options for healthier food items and liquid consumption of lemonade drinks with a healthier version. Reiterated on label reading of carbohydrates and portion sizes. Will review educational material on carbohydrate counting for snacks and meals.  Will verify pt attendance to all her scheduled medical appointments and continue to encouraged adherence. Will also verify pt's adherence to taking all her medications including the new medication from Dr. Etter Sjogren. Verified pt taking new medication without side effects.  Will discussed history of swelling to lower legs and available interventions with compression stockings and elevating her lower legs. Pt requested compression stockings and RN obtained measurements with the needed supply and assisted with the application. Encouraged pt to remove the compression stockings prior to bedtime and to only wear the TEDs when mobile during the day (pt verbalized an understanding).  Plan of care and goals adjusted accordingly to allow pt adherence over the next month. WiIll re-evaluate at that time and discuss ongoing management of care as pt is weaned from the program. RN will continue to encouraged adherence with the above ongoing issues and strongly encouraged pt to participate further in managing her care.   [name], RN Care Management Coordinator [place] Office [phone]

## 2016-03-22 ENCOUNTER — Encounter: Payer: Self-pay | Admitting: Medical

## 2016-03-22 ENCOUNTER — Ambulatory Visit (INDEPENDENT_AMBULATORY_CARE_PROVIDER_SITE_OTHER): Payer: PPO | Admitting: Medical

## 2016-03-22 VITALS — BP 138/70 | HR 90 | Temp 97.8°F | Ht 64.0 in | Wt 234.6 lb

## 2016-03-22 DIAGNOSIS — H6123 Impacted cerumen, bilateral: Secondary | ICD-10-CM | POA: Diagnosis not present

## 2016-03-22 DIAGNOSIS — R0981 Nasal congestion: Secondary | ICD-10-CM | POA: Diagnosis not present

## 2016-03-22 NOTE — Progress Notes (Signed)
Pre visit review using our clinic review tool, if applicable. No additional management support is needed unless otherwise documented below in the visit note. 

## 2016-03-22 NOTE — Progress Notes (Signed)
Subjective:    Patient ID: Stephanie Mckenzie, female    DOB: [birth year], 75 y.o.   MRN: [id number]  HPI  Pt states her ears blocked for a month. No ear pain except if she blows her nose. And if she does that only transient.   Pt states some nasal congestion for one month. No sneezing or itching eyes.  Pt started to use debrox 3-4 weeks ago.  Review of Systems  Constitutional: Negative for fever, chills and fatigue.  HENT: Positive for congestion. Negative for ear discharge, ear pain, postnasal drip, sinus pressure, sneezing, sore throat and trouble swallowing.        Ear pain transient when she blows her nose.  Respiratory: Negative for cough, shortness of breath and wheezing.   Cardiovascular: Negative for chest pain and palpitations.  Gastrointestinal: Negative for abdominal pain.  Musculoskeletal: Negative for back pain.  Neurological: Negative for dizziness, light-headedness and headaches.  Hematological: Negative for adenopathy. Does not bruise/bleed easily.  Psychiatric/Behavioral: Negative for behavioral problems and confusion.   Past Medical History  Diagnosis Date  . Hypertension   . Migraine   . Depression   . Edema 10/09/2015     Social History   Social History  . Marital Status: Divorced    Spouse Name: N/A  . Number of Children: 2  . Years of Education: N/A   Occupational History  . Retired    Social History Main Topics  . Smoking status: Former Smoker    Types: Cigarettes  . Smokeless tobacco: Never Used  . Alcohol Use: 0.0 oz/week    0 Standard drinks or equivalent per week     Comment: occasional  . Drug Use: No  . Sexual Activity: Not on file   Other Topics Concern  . Not on file   Social History Narrative    Past Surgical History  Procedure Laterality Date  . Cesarean section      x 2  . Appendectomy    . Parathyroidectomy    . Laminectomy    . Coronary angioplasty    . Abdominal hysterectomy      Family History  Problem Relation  Age of Onset  . Diabetes Mother   . Hypertension Mother   . Thyroid disease Mother     Allergies  Allergen Reactions  . Dilaudid [Hydromorphone] Other (See Comments)    Per daughter cause confusion and hallucinations. Causes confusion and hallucinations per pt's daughter  . Lorazepam Rash and Other (See Comments)    Other reaction(s): Hallucinations hallucinations  hallucinations  hallucinations   . Acyclovir And Related   . Amlodipine Besylate     Pedal edema  . Morphine And Related Itching  . Buprenorphine Hcl Itching    Current Outpatient Prescriptions on File Prior to Visit  Medication Sig Dispense Refill  . aspirin 81 MG tablet Take 81 mg by mouth daily.    Marland Kitchen atorvastatin (LIPITOR) 40 MG tablet Take 1 tablet (40 mg total) by mouth daily. 90 tablet 1  . diltiazem (CARDIZEM CD) 180 MG 24 hr capsule Take 1 capsule (180 mg total) by mouth daily. 90 capsule 1  . FLUoxetine (PROZAC) 40 MG capsule Take 1 capsule by mouth daily.    . fluticasone (FLONASE) 50 MCG/ACT nasal spray Place 2 sprays into both nostrils daily. 16 g 6  . folic acid (FOLVITE) 1 MG tablet Take 1 tablet (1 mg total) by mouth daily. 30 tablet 5  . furosemide (LASIX) 20 MG tablet TAKE ONE  TABLET BY MOUTH ONCE DAILY AS NEEDED FOR EDEMA 15 tablet 1  . glipiZIDE (GLUCOTROL) 5 MG tablet Take 1 tablet (5 mg total) by mouth daily before breakfast. 30 tablet 5  . glucose blood test strip Check blood sugar no more than three times daily. 300 each 5  . metoprolol succinate (TOPROL XL) 50 MG 24 hr tablet Take 1 tablet (50 mg total) by mouth 2 (two) times daily. 60 tablet 5  . mirtazapine (REMERON) 30 MG tablet Take 30 mg by mouth at bedtime.    Marland Kitchen nystatin (MYCOSTATIN/NYSTOP) 100000 UNIT/GM POWD Apply up to qid 60 g 1  . omeprazole (PRILOSEC) 40 MG capsule Take 40 mg by mouth 2 (two) times daily.    . ONE TOUCH LANCETS MISC Check blood sugar no more than three times daily 300 each 5  . oxybutynin (DITROPAN XL) 15 MG 24 hr  tablet Take 1 tablet (15 mg total) by mouth daily. 30 tablet 5  . potassium chloride (K-DUR) 10 MEQ tablet Take 1 tablet (10 mEq total) by mouth 3 (three) times daily. (Patient taking differently: Take 10 mEq by mouth 2 (two) times daily. ) 90 tablet 1  . torsemide (DEMADEX) 20 MG tablet 2 po qd 60 tablet 2  . vitamin B-12 (CYANOCOBALAMIN) 1000 MCG tablet Take 1,000 mcg by mouth daily.    . Vitamin D, Ergocalciferol, (DRISDOL) 50000 units CAPS capsule TAKE ONE CAPSULE BY MOUTH TWICE A WEEK 8 capsule 0  . zolpidem (AMBIEN) 5 MG tablet Take 5 mg by mouth once.     No current facility-administered medications on file prior to visit.    BP 138/70 mmHg  Pulse 90  Temp(Src) 97.8 F (36.6 C) (Oral)  Ht 5\' 4"  (1.626 m)  Wt 234 lb 9.6 oz (106.414 kg)  BMI 40.25 kg/m2  SpO2 94%       Objective:   Physical Exam   General  Mental Status - Alert. General Appearance - Well groomed. Not in acute distress.  Skin Rashes- No Rashes.  HEENT Head- Normal. Ear Auditory Canal - Left- Normal. Right - Normal.Tympanic Membrane- Left- wax obstuction. prelavage could not see tm.(post lavage tm looks good. 99% of wax removed. Only flaky wax left.  Right- mild wax rt side. Small portion of tm seen and looks normal.(post lavage wax removed) Eye Sclera/Conjunctiva- Left- Normal. Right- Normal. Nose & Sinuses Nasal Mucosa- Left-  Boggy and Congested. Right-  Boggy and  Congested.Bilateral no  maxillary and no frontal sinus pressure. Mouth & Throat Lips: Upper Lip- Normal: no dryness, cracking, pallor, cyanosis, or vesicular eruption. Lower Lip-Normal: no dryness, cracking, pallor, cyanosis or vesicular eruption. Buccal Mucosa- Bilateral- No Aphthous ulcers. Oropharynx- No Discharge or Erythema. Tonsils: Characteristics- Bilateral- No Erythema or Congestion. Size/Enlargement- Bilateral- No enlargement. Discharge- bilateral-None.  Neck Neck- Supple. No Masses.   Chest and Lung  Exam Auscultation: Breath Sounds:-Clear even and unlabored.  Cardiovascular Auscultation:Rythm- Regular, rate and rhythm. Murmurs & Other Heart Sounds:Ausculatation of the heart reveal- No Murmurs.  Lymphatic Head & Neck General Head & Neck Lymphatics: Bilateral: Description- No Localized lymphadenopathy.      Assessment & Plan:  For nasal congestion liklely allergy related can continue flonase.   Your wax was cleared. For future accumulation can use debrox to soften up wax before coming in as you did recently. But if ear pain come in sooner  Follow up as needed.  Sherita Decoste, Percell Miller, PA-C

## 2016-03-22 NOTE — Patient Instructions (Signed)
For nasal congestion liklely allergy related can continue flonase.   Your wax was cleared. For future accumulation can use debrox to soften up wax before coming in as you did recently. But if ear pain come in sooner  Follow up as needed.

## 2016-03-26 ENCOUNTER — Other Ambulatory Visit: Payer: Self-pay | Admitting: Family Medicine

## 2016-03-27 ENCOUNTER — Ambulatory Visit
Admission: RE | Admit: 2016-03-27 | Discharge: 2016-03-27 | Disposition: A | Discharge status: Home / Self Care | Payer: PPO | Source: Ambulatory Visit | Attending: Family Medicine | Admitting: Family Medicine

## 2016-03-27 DIAGNOSIS — Z780 Asymptomatic menopausal state: Secondary | ICD-10-CM | POA: Diagnosis not present

## 2016-03-27 DIAGNOSIS — M810 Age-related osteoporosis without current pathological fracture: Secondary | ICD-10-CM | POA: Diagnosis not present

## 2016-03-27 DIAGNOSIS — E2839 Other primary ovarian failure: Secondary | ICD-10-CM

## 2016-04-02 ENCOUNTER — Other Ambulatory Visit: Payer: Self-pay | Admitting: *Deleted

## 2016-04-02 NOTE — Patient Outreach (Signed)
[place] [facility]) Care Management  04/02/2016  Stephanie Mckenzie 04-04-1941 [id number]   RN received a call via voice message requesting what to do for a nutritionist concerning her diabetes. RN returned to call today and discussed pt's issues further. Pt states she has made some changes in her dietary habits but feels she needs something more constructive and specific for her needs and eating habits. RN encouraged pt to speak with her provider (Dr. Etter Sjogren) and request a dietitian or a nutritionist that can generate a dietary plan base upon incorporating some of the foods she enjoys. Pt also reports some swelling to her lower legs with more swelling to her left then her right and more then her norm.  States it's hard for her to apply her TEDs in the morning and she on most occassions is unable to apply them. RN encouraged pt to call upon her son or daughter who are local to assist with this process if she feels her legs are swelling throughout the day prior to their work day if this assistance is needed. Due to this more then normal swelling RN has strongly encourage pt to contact her provide with this information along with the request for a nutritionist. RN also verified pt is on the salt smart plan and watching her sodium and fluid intake. Pt aware if pain or other symptoms occur to seek medical attention immediately. Pt verbalized an understanding and will follow through with contacting her provider. Pt aware to contact this RN if further inquires are needed.  [name], RN Care Management Coordinator [place] Office [phone]

## 2016-04-03 ENCOUNTER — Other Ambulatory Visit: Payer: Self-pay

## 2016-04-03 NOTE — Patient Outreach (Signed)
[facility]) Care Management  [place]   04/03/2016  Stephanie Mckenzie 1941-05-31 [id number]  Subjective: Stephanie Mckenzie is a 75 year old female who was referred to pharmacy for medication management and review.  This is my follow up visit to see how she is filling her pill box and to assess her adherence.  She is doing a great job.  She stated she is having no issues filling her pill box.  She has not started any new medications.  She is monitoring her blood sugars once a day.  She is not having any side effects from her medications.  She has not questions for me today. She would like to see a nutritionist to help her with her diet.     Objective:   Encounter Medications: Outpatient Encounter Prescriptions as of 04/03/2016  Medication Sig Note  . aspirin 81 MG tablet Take 81 mg by mouth daily.   Marland Kitchen atorvastatin (LIPITOR) 40 MG tablet Take 1 tablet (40 mg total) by mouth daily.   Marland Kitchen diltiazem (CARDIZEM CD) 180 MG 24 hr capsule Take 1 capsule (180 mg total) by mouth daily.   Marland Kitchen FLUoxetine (PROZAC) 40 MG capsule Take 1 capsule by mouth daily. 11/15/2015: Received from: [place]: Take 1 daily  . fluticasone (FLONASE) 50 MCG/ACT nasal spray Place 2 sprays into both nostrils daily.   . folic acid (FOLVITE) 1 MG tablet Take 1 tablet (1 mg total) by mouth daily.   . furosemide (LASIX) 20 MG tablet TAKE ONE TABLET BY MOUTH ONCE DAILY AS NEEDED FOR  EDEMA   . glipiZIDE (GLUCOTROL) 5 MG tablet Take 1 tablet (5 mg total) by mouth daily before breakfast.   . glucose blood test strip Check blood sugar no more than three times daily.   . metoprolol succinate (TOPROL XL) 50 MG 24 hr tablet Take 1 tablet (50 mg total) by mouth 2 (two) times daily.   . mirtazapine (REMERON) 30 MG tablet Take 30 mg by mouth at bedtime.   Marland Kitchen nystatin (MYCOSTATIN/NYSTOP) 100000 UNIT/GM POWD Apply up to qid   . omeprazole (PRILOSEC) 40 MG capsule Take 40 mg by mouth 2 (two) times daily.   . ONE  TOUCH LANCETS MISC Check blood sugar no more than three times daily   . oxybutynin (DITROPAN XL) 15 MG 24 hr tablet Take 1 tablet (15 mg total) by mouth daily.   . potassium chloride (K-DUR) 10 MEQ tablet Take 1 tablet (10 mEq total) by mouth 3 (three) times daily. (Patient taking differently: Take 10 mEq by mouth 2 (two) times daily. ) 02/27/2016: Could not remember to take the middle of the day dose due to being the only medication she took at that time  . torsemide (DEMADEX) 20 MG tablet TAKE TWO TABLETS BY MOUTH ONCE DAILY   . vitamin B-12 (CYANOCOBALAMIN) 1000 MCG tablet Take 1,000 mcg by mouth daily. 05/26/2015: Received from: [place]: Take 1,000 mcg by mouth.  . Vitamin D, Ergocalciferol, (DRISDOL) 50000 units CAPS capsule TAKE ONE CAPSULE BY MOUTH TWICE A WEEK   . zolpidem (AMBIEN) 5 MG tablet Take 5 mg by mouth once.    No facility-administered encounter medications on file as of 04/03/2016.    Functional Status: In your present state of health, do you have any difficulty performing the following activities: 02/21/2016 09/02/2015  Hearing? [name]  Vision? N Y  Difficulty concentrating or making decisions? N Y  Walking or climbing stairs?  N Y  Dressing or bathing? N N  Doing errands, shopping? N -  Preparing Food and eating ? N -  Using the Toilet? N -  In the past six months, have you accidently leaked urine? N -  Do you have problems with loss of bowel control? N -  Managing your Medications? N -  Managing your Finances? N -  Housekeeping or managing your Housekeeping? N -    Fall/Depression Screening: PHQ 2/9 Scores 02/21/2016 02/10/2016 06/17/2015 05/26/2015  PHQ - 2 Score _0 PHQ- 9 Score - _1 Assessment: 1.  Medication adherence:  She is adherent to her medications.  She has made great improvements in her adherence to her medications.   2.  Monitoring blood glucose:  She is monitoring her blood glucose daily now to get a better idea of how her  blood sugars are running.   Plan: 1.  I congratulated her on her success with her adherence to her medications.  She is doing a great job managing them on her own.  2.  I will send a note to Dr. Etter Sjogren in regards to a nutritionist referral.  3.  I encouraged her to monitor her blood sugars at different times of the day so she would have a better idea of how her blood sugars were running throughout the day. 4.  I will close her case to pharmacy since all of her pharmacy needs have been met.  She has my number in case she has future pharmacy questions.  I will let her nurse care manager know as well.   THN CM Care Plan Problem One        Most Recent Value   Care Plan Problem One  Medication adherence   Role Documenting the Problem One  Clinical Pharmacist   Care Plan for Problem One  Active   THN CM Short Term Goal #1 (0-30 days)  Stephanie Mckenzie will be adherent to her medications at least 80% of the time evident by patient report and refill history   THN CM Short Term Goal #1 Start Date  03/05/16   [facility] CM Short Term Goal #1 Met Date  04/03/16   Interventions for Short Term Goal #1  Stephanie Mckenzie was able to fill her pill box correctly.  She is taking her medication timely and she has a good understanding of them.      Deanne Coffer, PharmD, [place] [phone]

## 2016-04-04 ENCOUNTER — Telehealth: Payer: Self-pay | Admitting: Family Medicine

## 2016-04-04 NOTE — Telephone Encounter (Signed)
Pt called in stating that he she is returning Kim's call, not showing a message from [place], however pt says that she called her yesterday 04/03/16. Pt is requesting a call back.   CB: [phone]

## 2016-04-05 ENCOUNTER — Other Ambulatory Visit: Payer: Self-pay

## 2016-04-05 MED ORDER — ALENDRONATE SODIUM 70 MG PO TABS: 70.0000 mg | ORAL_TABLET | ORAL | Status: DC

## 2016-04-16 ENCOUNTER — Other Ambulatory Visit: Payer: Self-pay | Admitting: Endocrinology

## 2016-04-17 ENCOUNTER — Telehealth: Payer: Self-pay | Admitting: Endocrinology

## 2016-04-17 NOTE — Telephone Encounter (Signed)
please call patient: We received a refill request for the vitamin-D But first, we need to recheck the blood test. Please come in to recheck, here or at [facility] office

## 2016-04-18 NOTE — Telephone Encounter (Signed)
I contacted the pt and advised of note below. Pt stated she would call back to schedule her lab appointment.

## 2016-04-19 ENCOUNTER — Other Ambulatory Visit: Payer: Self-pay | Admitting: *Deleted

## 2016-04-20 ENCOUNTER — Encounter: Payer: Self-pay | Admitting: *Deleted

## 2016-04-20 NOTE — Patient Outreach (Addendum)
[facility]) Care Management   04/19/2016  ROSIBEL GIACOBBE 27-Jan-1941 [id number]  Stephanie Mckenzie is an 75 y.o. female  Subjective:  DM: Pt reports she takes her BS but not daily or weekly just several time in the month. States her readings are always less then 100 with no sign or symptoms of low or elevate BS. Reports taking all her prescribed medications with no problems SWELLING: Pt states she will always have swelling due to her history but the compression stockings she has at this time are to difficult to apply. Pt states she will start elevated her lower legs more throughout the day and when possible get her daughter or son to apply if they are visiting for that day. Pt remains aware to remove the stockings prior to bedtime and again reapply when mobile.  SAFETY: Pt reports she has joined the gym and hired a Physiological scientist over the next 3 months several days week. Pt states she is sore from the training and stiff in her movement but tolerable. Denies any injuries at this time. Pt reports she taking her medication and attending all medical appointments with no delays. No other inquires or request at this time.    Objective:   Review of Systems  All other systems reviewed and are negative.   Physical Exam  Constitutional: She is oriented to person, place, and time. She appears well-developed.  HENT:  Right Ear: External ear normal.  Left Ear: External ear normal.  Eyes: EOM are normal.  Neck: Normal range of motion. Neck supple.  Cardiovascular: Normal heart sounds.   Respiratory: Effort normal and breath sounds normal.  GI: Soft. Bowel sounds are normal.  Neurological: She is alert and oriented to person, place, and time.  Skin: Skin is warm and dry.  Psychiatric: She has a normal mood and affect. Her behavior is normal. Judgment and thought content normal.    Encounter Medications:   Outpatient Encounter Prescriptions as of 04/19/2016  Medication Sig Note   . alendronate (FOSAMAX) 70 MG tablet Take 1 tablet (70 mg total) by mouth every 7 (seven) days. Take with a full glass of water on an empty stomach.   Marland Kitchen aspirin 81 MG tablet Take 81 mg by mouth daily.   Marland Kitchen atorvastatin (LIPITOR) 40 MG tablet Take 1 tablet (40 mg total) by mouth daily.   Marland Kitchen diltiazem (CARDIZEM CD) 180 MG 24 hr capsule Take 1 capsule (180 mg total) by mouth daily.   Marland Kitchen FLUoxetine (PROZAC) 40 MG capsule Take 1 capsule by mouth daily. 11/15/2015: Received from: [place]: Take 1 daily  . fluticasone (FLONASE) 50 MCG/ACT nasal spray Place 2 sprays into both nostrils daily.   . folic acid (FOLVITE) 1 MG tablet Take 1 tablet (1 mg total) by mouth daily.   . furosemide (LASIX) 20 MG tablet TAKE ONE TABLET BY MOUTH ONCE DAILY AS NEEDED FOR  EDEMA   . glipiZIDE (GLUCOTROL) 5 MG tablet Take 1 tablet (5 mg total) by mouth daily before breakfast.   . glucose blood test strip Check blood sugar no more than three times daily.   . metoprolol succinate (TOPROL XL) 50 MG 24 hr tablet Take 1 tablet (50 mg total) by mouth 2 (two) times daily.   . mirtazapine (REMERON) 30 MG tablet Take 30 mg by mouth at bedtime.   Marland Kitchen nystatin (MYCOSTATIN/NYSTOP) 100000 UNIT/GM POWD Apply up to qid   . omeprazole (PRILOSEC) 40 MG capsule Take 40 mg  by mouth 2 (two) times daily.   . ONE TOUCH LANCETS MISC Check blood sugar no more than three times daily   . oxybutynin (DITROPAN XL) 15 MG 24 hr tablet Take 1 tablet (15 mg total) by mouth daily.   . potassium chloride (K-DUR) 10 MEQ tablet Take 1 tablet (10 mEq total) by mouth 3 (three) times daily. (Patient taking differently: Take 10 mEq by mouth 2 (two) times daily. ) 02/27/2016: Could not remember to take the middle of the day dose due to being the only medication she took at that time  . torsemide (DEMADEX) 20 MG tablet TAKE TWO TABLETS BY MOUTH ONCE DAILY   . vitamin B-12 (CYANOCOBALAMIN) 1000 MCG tablet Take 1,000 mcg by mouth daily. 05/26/2015:  Received from: [place]: Take 1,000 mcg by mouth.  . Vitamin D, Ergocalciferol, (DRISDOL) 50000 units CAPS capsule TAKE ONE CAPSULE BY MOUTH TWICE A WEEK   . zolpidem (AMBIEN) 5 MG tablet Take 5 mg by mouth once.    No facility-administered encounter medications on file as of 04/19/2016.    Functional Status:   In your present state of health, do you have any difficulty performing the following activities: 02/21/2016 09/02/2015  Hearing? [name]  Vision? N Y  Difficulty concentrating or making decisions? N Y  Walking or climbing stairs? N Y  Dressing or bathing? N N  Doing errands, shopping? N -  Preparing Food and eating ? N -  Using the Toilet? N -  In the past six months, have you accidently leaked urine? N -  Do you have problems with loss of bowel control? N -  Managing your Medications? N -  Managing your Finances? N -  Housekeeping or managing your Housekeeping? N -  BP 118/60 mmHg  Pulse 84  Resp 20  SpO2 98%  Fall/Depression Screening:    PHQ 2/9 Scores 02/21/2016 02/10/2016 06/17/2015 05/26/2015  PHQ - 2 Score 2 2 6 6   PHQ- 9 Score - 8 15 19     Assessment:  Ongoing case management related to diabetes Follow up on medication adherence Follow up on nutritional habits related to healthy eating habits. Safety issues related to increase mobility Follow up on bilateral swelling related to use of her TEDs.   Plan:  Will completed a physical assessment and further inquire on pt's ongoing knowledge base related managing her diabetes. Verified pt's frequency and encouraged pt to complete several CBG throughout the week if not daily. Will verify pt's readings within good standings (below 100 as pt indicated).  Will continue to encourage adherence with managing her diabetes.  Will verify pt is taking all her prescribed medications as prescribed. Will verify pt has made several changes in her eating and drink habits to regulate her diabetes. Will continue to encouraged  pt on health food habits.  Will discuss pt' new person trainer and verified pt stretches prior to her workouts and continue to encourged pt on safety measure and not to over exhaust herself during her new workouts.  Will verify pt continue to use her compression stockings (state difficulty to apply). Will strongly encourage to wear as often as she is able to seek assistance through the week to apply the compression stockings with her son or daughter.  Will stress the risk of not wearing the compression stocking with ongoing swelling to her lower legs. Pt also informed if not able to wearing the compression stockings to elevate her legs throughout the day at much as  possible.  Based upon pt's process and plan of care with most goals met will discuss possible discharge from the program next month based upon pt remains confident with managing her care. Discussed possible telephonic discharge next month however pt prefers a home visit for her pending discharge visit. Will schedule accordingly.  [name], RN Care Management Coordinator [place] Office [phone]

## 2016-05-01 ENCOUNTER — Other Ambulatory Visit: Payer: Self-pay | Admitting: Family Medicine

## 2016-05-15 ENCOUNTER — Other Ambulatory Visit: Payer: Self-pay | Admitting: *Deleted

## 2016-05-15 ENCOUNTER — Ambulatory Visit: Payer: Self-pay | Admitting: *Deleted

## 2016-05-15 NOTE — Patient Outreach (Signed)
[facility]) Care Management  05/15/2016  DEAZIA LAMPI 04/05/1941 [id number]   RN received a call back from pt via voice message apologizing for the appointment this morning. Pt states she fell asleep and did not heard the doorbell. Pt states she would not be available last in the day however request RN to follow up on another day. RN will attempt to reach pt tomorrow and completed the assessment with plans to once again discharge pt via [facility] services if all goals are met.  [name], RN Care Management Coordinator [place] Office [phone]

## 2016-05-15 NOTE — Patient Outreach (Signed)
[facility]) Care Management  05/15/2016  Stephanie Mckenzie Feb 08, 1941 [id number]   RN had a scheduled home visit with pt today and arrived at her home. RN knocked and rand the door bell several times but no answer. RN contacted the pt however unsuccessful. RN left business card on the door that requested a call back. Will completed the discharge of this pt if appropriate. Note today was the last scheduled discharge home visit if pt remains well and all goals are met. Will re-evaluate pt's progress and discharge if applicable.   [name], RN Care Management Coordinator [place] Office [phone]

## 2016-05-16 ENCOUNTER — Other Ambulatory Visit: Payer: Self-pay | Admitting: *Deleted

## 2016-05-16 NOTE — Patient Outreach (Signed)
[facility]) Care Management  05/16/2016  BROOKLAN DUSEK 1941/09/18 [id number]   RN attempted outreach call to pt today due to the NO SHOW appointment on yesterday however pt not available and RN only able to leave a HIPAA approved voice message requesting a call back. Will further pt's progress and determine if pt continues to manage her diabetes well enough for the planned discharged for this month as discussed last month.,  [name], RN Care Management Coordinator [place] Office [phone]

## 2016-05-17 ENCOUNTER — Other Ambulatory Visit: Payer: Self-pay | Admitting: *Deleted

## 2016-05-17 NOTE — Patient Outreach (Signed)
[facility]) Care Management  05/17/2016  LILYANNA REHM [birth year] [id number]   RN attempted outreach call today however unsuccessful. Will continue to reach pt for an  ongoing telephone discharge call over the next week.  [name], RN Care Management Coordinator [place] Office [phone]

## 2016-05-21 ENCOUNTER — Telehealth: Payer: Self-pay

## 2016-05-21 NOTE — Telephone Encounter (Signed)
I'm here late tomorrow

## 2016-05-21 NOTE — Telephone Encounter (Signed)
Pt walked into clinic with complaints of bilateral leg/feet swelling.  Pt states she went to the chiropractor today for her left knee and he told her that she was retaining too much fluids in her legs and to stop by her PCP's office to see if she would be able to see patient today.  Dr. Etter Sjogren had no openings and advised that an RN assess her to see if she needed to go the ER.   Assessment:  Pt states legs feel heavy.  Noticed swelling over the weekend and symptoms persisted today.  Bilateral knee, leg and feet swelling noted.  Legs feel tight and warm to touch.  No discoloration noted.  + capillary refills noted bilaterally. Difficult to feel pulses due to swelling.   Pt c/o of bilateral leg pain.  Described as "achy".  Experiencing more pain in left leg than right leg.  However, she says she twisted her left knee (reason for seeing chiropractor) and also had previous surgery in left ankle.   Swelling also noted in bilateral hands.   Denies shortness of breath/chest pain.  O2 Sat: 98%, HR: 73,  Lung clear, but course throughout.  Pt states she workout 3 days per week without difficulty.   Due to patient having more pain in left leg that right leg and because patient appeared to have difficulty walking, pt was advised to go to ER.  Pt declined stating she has an animal at home that she cannot leave and opted to come in tomorrow to see Mackie Pai, PA-C if Dr. Etter Sjogren did not have any openings.    Med list states that patient was given a script of furosemide as needed for edema on 03/26/16.  Pt states she has not been taking medication.  States she was unaware of the prescription.    Because pt declined to go to ER, she was advised to pick up script for furosemide, to take it, elevate legs while resting and to go back up front to schedule an appt for tomorrow.  She was also advised if symptoms worsen or new symptoms develop, to go to the ER.  She agreed to comply.    [place] desk staff informs Percell Miller  nor Etter Sjogren has any openings tomorrow.  Only Dr. Larose Kells at 8:30am.  Pt states she couldn't come until after 10:30 am.   Therefore, pt scheduled appt for Wednesday with Percell Miller and was again was strongly advised if symptoms worsen to go to the ER.  She agreed.

## 2016-05-22 NOTE — Telephone Encounter (Signed)
She always need 30 min or more

## 2016-05-22 NOTE — Telephone Encounter (Signed)
No openings on your schedule.  Can we open slot for patient?

## 2016-05-23 ENCOUNTER — Ambulatory Visit: Payer: PPO | Admitting: Medical

## 2016-05-23 NOTE — Telephone Encounter (Addendum)
Pt cancelled her appt.  Called pt to follow up with her.  Left a message for call back.  Closed encounter until patient calls back.

## 2016-05-25 ENCOUNTER — Other Ambulatory Visit: Payer: Self-pay | Admitting: Family Medicine

## 2016-05-31 ENCOUNTER — Encounter: Payer: Self-pay | Admitting: Medical

## 2016-05-31 ENCOUNTER — Telehealth: Payer: Self-pay | Admitting: Medical

## 2016-05-31 ENCOUNTER — Ambulatory Visit (HOSPITAL_BASED_OUTPATIENT_CLINIC_OR_DEPARTMENT_OTHER)
Admission: RE | Admit: 2016-05-31 | Discharge: 2016-05-31 | Disposition: A | Discharge status: Home / Self Care | Payer: PPO | Source: Ambulatory Visit | Attending: Medical | Admitting: Medical

## 2016-05-31 ENCOUNTER — Ambulatory Visit (INDEPENDENT_AMBULATORY_CARE_PROVIDER_SITE_OTHER): Payer: PPO | Admitting: Medical

## 2016-05-31 VITALS — BP 130/70 | HR 80 | Temp 97.9°F | Ht 64.0 in | Wt 242.2 lb

## 2016-05-31 DIAGNOSIS — M79609 Pain in unspecified limb: Secondary | ICD-10-CM | POA: Diagnosis not present

## 2016-05-31 DIAGNOSIS — E1165 Type 2 diabetes mellitus with hyperglycemia: Secondary | ICD-10-CM | POA: Diagnosis not present

## 2016-05-31 DIAGNOSIS — IMO0002 Reserved for concepts with insufficient information to code with codable children: Secondary | ICD-10-CM

## 2016-05-31 DIAGNOSIS — E1151 Type 2 diabetes mellitus with diabetic peripheral angiopathy without gangrene: Secondary | ICD-10-CM

## 2016-05-31 DIAGNOSIS — M25562 Pain in left knee: Secondary | ICD-10-CM | POA: Diagnosis not present

## 2016-05-31 DIAGNOSIS — M79605 Pain in left leg: Secondary | ICD-10-CM | POA: Diagnosis not present

## 2016-05-31 DIAGNOSIS — E669 Obesity, unspecified: Secondary | ICD-10-CM

## 2016-05-31 DIAGNOSIS — E118 Type 2 diabetes mellitus with unspecified complications: Secondary | ICD-10-CM

## 2016-05-31 NOTE — Progress Notes (Signed)
Pre visit review using our clinic review tool, if applicable. No additional management support is needed unless otherwise documented below in the visit note. 

## 2016-05-31 NOTE — Telephone Encounter (Signed)
Will you help refer Stephanie Mckenzie to nuritionist for diabetes and obesity. Weight loss.

## 2016-05-31 NOTE — Progress Notes (Signed)
Subjective:    Patient ID: Stephanie Mckenzie, female    DOB: 1941-02-26, 75 y.o.   MRN: [id number]  HPI   Pt in stating she has some left knee for 14 days. Pt had been doing some personal training for two months. Various exercises. No known injury. Then 2 wks ago on Saturday am the left knee was swollen and has pain. Pt thinks maybe has mensicus injury. Pt never had a knee xray then reviewed chart and it is not the case.   Pt is obese. She is not loosen weight despite diet and exercise. Pt wants to see a dietician.  Pt last a1-c was 6.5. Pt has been eating low sugar diet. When she checks her sugars are 70-118. Most are undercontrol.   Obese- last year Jne 2016 she weighted 214. Pt is upset about failed weight loss efforts.     Review of Systems  Constitutional: Negative for chills and fatigue.  Respiratory: Negative for cough, chest tightness, shortness of breath and wheezing.   Cardiovascular: Negative for chest pain and palpitations.  Gastrointestinal: Negative for abdominal pain.  Musculoskeletal: Negative for back pain.       Knee pain. poplieal pain.  Skin: Negative for rash.  Neurological: Negative for dizziness and headaches.  Hematological: Negative for adenopathy. Does not bruise/bleed easily.  Psychiatric/Behavioral: Negative for behavioral problems and confusion.    Past Medical History  Diagnosis Date  . Hypertension   . Migraine   . Depression   . Edema 10/09/2015     Social History   Social History  . Marital Status: Divorced    Spouse Name: N/A  . Number of Children: 2  . Years of Education: N/A   Occupational History  . Retired    Social History Main Topics  . Smoking status: Former Smoker    Types: Cigarettes  . Smokeless tobacco: Never Used  . Alcohol Use: 0.0 oz/week    0 Standard drinks or equivalent per week     Comment: occasional  . Drug Use: No  . Sexual Activity: Not on file   Other Topics Concern  . Not on file   Social History  Narrative    Past Surgical History  Procedure Laterality Date  . Cesarean section      x 2  . Appendectomy    . Parathyroidectomy    . Laminectomy    . Coronary angioplasty    . Abdominal hysterectomy      Family History  Problem Relation Age of Onset  . Diabetes Mother   . Hypertension Mother   . Thyroid disease Mother     Allergies  Allergen Reactions  . Dilaudid [Hydromorphone] Other (See Comments)    Per daughter cause confusion and hallucinations. Causes confusion and hallucinations per pt's daughter  . Lorazepam Rash and Other (See Comments)    Other reaction(s): Hallucinations hallucinations  hallucinations  hallucinations   . Acyclovir And Related   . Amlodipine Besylate     Pedal edema  . Morphine And Related Itching  . Buprenorphine Hcl Itching    Current Outpatient Prescriptions on File Prior to Visit  Medication Sig Dispense Refill  . alendronate (FOSAMAX) 70 MG tablet Take 1 tablet (70 mg total) by mouth every 7 (seven) days. Take with a full glass of water on an empty stomach. 4 tablet 11  . aspirin 81 MG tablet Take 81 mg by mouth daily.    Marland Kitchen atorvastatin (LIPITOR) 40 MG tablet Take 1 tablet (40  mg total) by mouth daily. 90 tablet 1  . diltiazem (CARDIZEM CD) 180 MG 24 hr capsule Take 1 capsule (180 mg total) by mouth daily. 90 capsule 1  . FLUoxetine (PROZAC) 40 MG capsule Take 1 capsule by mouth daily.    . fluticasone (FLONASE) 50 MCG/ACT nasal spray Place 2 sprays into both nostrils daily. 16 g 6  . folic acid (FOLVITE) 1 MG tablet Take 1 tablet (1 mg total) by mouth daily. 30 tablet 5  . folic acid (FOLVITE) 1 MG tablet TAKE ONE TABLET BY MOUTH ONCE DAILY 30 tablet 0  . furosemide (LASIX) 20 MG tablet TAKE ONE TABLET BY MOUTH ONCE DAILY AS NEEDED FOR  EDEMA 15 tablet 2  . glipiZIDE (GLUCOTROL) 5 MG tablet Take 1 tablet (5 mg total) by mouth daily before breakfast. 30 tablet 5  . glucose blood test strip Check blood sugar no more than three times  daily. 300 each 5  . metoprolol succinate (TOPROL XL) 50 MG 24 hr tablet Take 1 tablet (50 mg total) by mouth 2 (two) times daily. 60 tablet 5  . mirtazapine (REMERON) 30 MG tablet Take 30 mg by mouth at bedtime.    Marland Kitchen nystatin (MYCOSTATIN/NYSTOP) 100000 UNIT/GM POWD Apply up to qid 60 g 1  . omeprazole (PRILOSEC) 40 MG capsule Take 40 mg by mouth 2 (two) times daily.    . ONE TOUCH LANCETS MISC Check blood sugar no more than three times daily 300 each 5  . oxybutynin (DITROPAN XL) 15 MG 24 hr tablet Take 1 tablet (15 mg total) by mouth daily. 30 tablet 5  . potassium chloride (K-DUR) 10 MEQ tablet TAKE ONE TABLET BY MOUTH THREE TIMES DAILY 90 tablet 2  . torsemide (DEMADEX) 20 MG tablet TAKE TWO TABLETS BY MOUTH ONCE DAILY 60 tablet 2  . Vitamin D, Ergocalciferol, (DRISDOL) 50000 units CAPS capsule TAKE ONE CAPSULE BY MOUTH TWICE A WEEK 8 capsule 0  . zolpidem (AMBIEN) 5 MG tablet Take 5 mg by mouth once.     No current facility-administered medications on file prior to visit.    BP 130/70 mmHg  Pulse 80  Temp(Src) 97.9 F (36.6 C) (Oral)  Ht 5\' 4"  (1.626 m)  Wt 242 lb 3.2 oz (109.861 kg)  BMI 41.55 kg/m2  SpO2 98%       Objective:   Physical Exam   General Mental Status- Alert. General Appearance- Not in acute distress.   Skin General: Color- Normal Color. Moisture- Normal Moisture.  Neck Carotid Arteries- Normal color. Moisture- Normal Moisture. No carotid bruits. No JVD.  Chest and Lung Exam Auscultation: Breath Sounds:-Normal.  Cardiovascular Auscultation:Rythm- Regular. Murmurs & Other Heart Sounds:Auscultation of the heart reveals- No Murmurs.   Neurologic Cranial Nerve exam:- CN III-XII intact(No nystagmus), symmetric smile. Strength:- 5/5 equal and symmetric strength both upper and lower extremities.  Left lower knee- diffuse swelling of knee. Faint pain on flexion and extension. Mild crepitus. Neg homans signs. But reports some pain in popliteal area when  she walks at time.     Assessment & Plan:  For your left knee pain will get xray of your knee. Use tylenol for pain.  Will also get uric acid. Go ahead and refer to ortho at your request.  For your popliteal pain will get stat US to check for DVT.  For diabetes will get a1c and cmp. Will let you know if any changes to treatment need to be made.  Sent message to Ronaldo Miyamoto  MA to make referral to nutritionist to discuss diabetic diet and weight loss diet.  Follow up in 7-10 days or as needed  Yisel Megill, Percell Miller, [employer], PA-C

## 2016-05-31 NOTE — Patient Instructions (Addendum)
For your left knee pain will get xray of your knee. Use tylenol for pain.  Will also get uric acid. Go ahead and refer to ortho at your request.  For your popliteal pain will get stat US to check for DVT.  For diabetes will get a1c and cmp. Will let you know if any changes to treatment need to be made.  Sent message to Ronaldo Miyamoto MA to make referral to nutritionist to discuss diabetic diet and weight loss diet.  Follow up in 7-10 days or as needed

## 2016-05-31 NOTE — Telephone Encounter (Signed)
Referral placed.

## 2016-05-31 NOTE — Addendum Note (Signed)
Addended by: Anabel Halon on: 05/31/2016 03:24 PM   Modules accepted: Orders

## 2016-06-01 ENCOUNTER — Telehealth: Payer: Self-pay | Admitting: *Deleted

## 2016-06-01 ENCOUNTER — Telehealth: Payer: Self-pay | Admitting: Medical

## 2016-06-01 DIAGNOSIS — N289 Disorder of kidney and ureter, unspecified: Secondary | ICD-10-CM

## 2016-06-01 LAB — COMPREHENSIVE METABOLIC PANEL
ALK PHOS: 91 U/L (ref 39–117)
ALT: 13 U/L (ref 0–35)
AST: 13 U/L (ref 0–37)
Albumin: 3.9 g/dL (ref 3.5–5.2)
BUN: 38 mg/dL — AB (ref 6–23)
CO2: 23 mEq/L (ref 19–32)
CREATININE: 1.91 mg/dL — AB (ref 0.40–1.20)
Calcium: 9.8 mg/dL (ref 8.4–10.5)
Chloride: 110 mEq/L (ref 96–112)
GFR: 27.38 mL/min — ABNORMAL LOW (ref >60.00)
GLUCOSE: 47 mg/dL — AB (ref 70–99)
POTASSIUM: 4.3 meq/L (ref 3.5–5.1)
SODIUM: 140 meq/L (ref 135–145)
TOTAL PROTEIN: 6.7 g/dL (ref 6.0–8.3)
Total Bilirubin: 0.3 mg/dL (ref 0.2–1.2)

## 2016-06-01 LAB — URIC ACID: Uric Acid, Serum: 9.9 mg/dL — ABNORMAL HIGH (ref 2.4–7.0)

## 2016-06-01 LAB — HEMOGLOBIN A1C: HEMOGLOBIN A1C: 6.3 % (ref 4.6–6.5)

## 2016-06-01 NOTE — Telephone Encounter (Signed)
Left detailed message for pt on the note below per providers request and I also asked the pt to call back with any questions as well.

## 2016-06-01 NOTE — Telephone Encounter (Signed)
Pt sugar was 47 at [facility]. Call pt and advise her to have a snack. Check make sure she feels ok. Stop glucotrol. Pt a1c recnelty in 6 range. Have her follow up next week to discuss if she needs any med for diabetes in light of low sugar and very good a1c.

## 2016-06-01 NOTE — Telephone Encounter (Signed)
[facility] lab reporting critical lab Glucose @ 47

## 2016-06-02 MED ORDER — PREDNISONE 10 MG PO TABS: ORAL_TABLET | ORAL | Status: DC

## 2016-06-02 NOTE — Telephone Encounter (Signed)
Sent rx prednisone in.

## 2016-06-05 DIAGNOSIS — M25562 Pain in left knee: Secondary | ICD-10-CM | POA: Diagnosis not present

## 2016-06-05 DIAGNOSIS — M659 Synovitis and tenosynovitis, unspecified: Secondary | ICD-10-CM | POA: Diagnosis not present

## 2016-06-06 ENCOUNTER — Telehealth: Payer: Self-pay

## 2016-06-06 ENCOUNTER — Encounter: Payer: Self-pay | Admitting: Medical

## 2016-06-06 NOTE — Telephone Encounter (Signed)
I spoke with the patient and she states that she would like to see Dr. Orvan July in [place] who is a kidney specialist. Pt states that she would feel more comfortable seeing him. Please advise.

## 2016-06-07 NOTE — Telephone Encounter (Signed)
Referral faxed to Dr Yolonda Kida with [facility], awaiting appt

## 2016-06-11 ENCOUNTER — Encounter: Payer: Self-pay | Admitting: *Deleted

## 2016-06-11 ENCOUNTER — Other Ambulatory Visit: Payer: Self-pay | Admitting: *Deleted

## 2016-06-11 DIAGNOSIS — R32 Unspecified urinary incontinence: Secondary | ICD-10-CM | POA: Diagnosis not present

## 2016-06-11 DIAGNOSIS — Z9889 Other specified postprocedural states: Secondary | ICD-10-CM | POA: Diagnosis not present

## 2016-06-11 DIAGNOSIS — D4959 Neoplasm of unspecified behavior of other genitourinary organ: Secondary | ICD-10-CM | POA: Diagnosis not present

## 2016-06-11 NOTE — Patient Outreach (Signed)
[facility]) Care Management  06/11/2016  RASHEKA DENARD 1940/12/31 [id number]  RN spoke with pt today and discussed the recently missed visit pt states she is doing well with a recent visit to her primary doctors and her new A1C was 4.7 as pt continues her personal training at the [facility] and attempts to improve her dietary measures. Pt states her provider took her off of all her diabetic medications with no additional monitoring. Provided praise for her efforts in improving her overall diabetes over the course of several months. Pt very grateful for the [facility] services offered and agrees with case closure with all goals met and discussed today. Pt aware of how to contact [facility] if services are needed in the future. Case will be closed and primary will be notified.  [name], RN Care Management Coordinator [place] Office [phone]

## 2016-06-11 NOTE — Patient Outreach (Signed)
[facility]) Care Management  06/11/2016  KENNIYAH FELLA 16-Aug-1941 [id number]   RN attempted outreach call to this pt for possible telephonic case closure however pt not available. RN able to leave a HIPAA approved voice message requesting a call back. Will also send outreach letter and allow pt time to follow up prior to closure of this case.   [name], RN Care Management Coordinator [place] Office [phone]

## 2016-06-18 ENCOUNTER — Other Ambulatory Visit: Payer: Self-pay | Admitting: Family Medicine

## 2016-06-18 DIAGNOSIS — I10 Essential (primary) hypertension: Secondary | ICD-10-CM

## 2016-06-21 ENCOUNTER — Ambulatory Visit (INDEPENDENT_AMBULATORY_CARE_PROVIDER_SITE_OTHER): Payer: PPO | Admitting: Family Medicine

## 2016-06-21 ENCOUNTER — Encounter: Payer: Self-pay | Admitting: Family Medicine

## 2016-06-21 VITALS — BP 147/81 | HR 75 | Temp 97.9°F | Ht 64.0 in | Wt 241.4 lb

## 2016-06-21 DIAGNOSIS — E1165 Type 2 diabetes mellitus with hyperglycemia: Secondary | ICD-10-CM

## 2016-06-21 DIAGNOSIS — G4730 Sleep apnea, unspecified: Secondary | ICD-10-CM

## 2016-06-21 DIAGNOSIS — K589 Irritable bowel syndrome without diarrhea: Secondary | ICD-10-CM

## 2016-06-21 DIAGNOSIS — R600 Localized edema: Secondary | ICD-10-CM | POA: Diagnosis not present

## 2016-06-21 DIAGNOSIS — N390 Urinary tract infection, site not specified: Secondary | ICD-10-CM | POA: Diagnosis not present

## 2016-06-21 DIAGNOSIS — I10 Essential (primary) hypertension: Secondary | ICD-10-CM | POA: Diagnosis not present

## 2016-06-21 DIAGNOSIS — IMO0002 Reserved for concepts with insufficient information to code with codable children: Secondary | ICD-10-CM

## 2016-06-21 DIAGNOSIS — E1151 Type 2 diabetes mellitus with diabetic peripheral angiopathy without gangrene: Secondary | ICD-10-CM

## 2016-06-21 LAB — CBC WITH DIFFERENTIAL/PLATELET
BASOS PCT: 0.4 % (ref 0.0–3.0)
Basophils Absolute: 0 10*3/uL (ref 0.0–0.1)
EOS PCT: 1.5 % (ref 0.0–5.0)
Eosinophils Absolute: 0.1 10*3/uL (ref 0.0–0.7)
HCT: 38.3 % (ref 36.0–46.0)
Hemoglobin: 12.5 g/dL (ref 12.0–15.0)
LYMPHS ABS: 2.1 10*3/uL (ref 0.7–4.0)
Lymphocytes Relative: 28.4 % (ref 12.0–46.0)
MCHC: 32.7 g/dL (ref 30.0–36.0)
MCV: 88.1 fl (ref 78.0–100.0)
MONO ABS: 0.6 10*3/uL (ref 0.1–1.0)
MONOS PCT: 8.9 % (ref 3.0–12.0)
NEUTROS ABS: 4.4 10*3/uL (ref 1.4–7.7)
NEUTROS PCT: 60.8 % (ref 43.0–77.0)
Platelets: 256 10*3/uL (ref 150.0–400.0)
RBC: 4.34 Mil/uL (ref 3.87–5.11)
RDW: 17.9 % — AB (ref 11.5–15.5)
WBC: 7.3 10*3/uL (ref 4.0–10.5)

## 2016-06-21 LAB — LIPID PANEL
Cholesterol: 165 mg/dL (ref 0–200)
HDL: 40.9 mg/dL (ref >39.00)
LDL Cholesterol: 85 mg/dL (ref 0–99)
NONHDL: 124.52
Total CHOL/HDL Ratio: 4
Triglycerides: 197 mg/dL — ABNORMAL HIGH (ref 0.0–149.0)
VLDL: 39.4 mg/dL (ref 0.0–40.0)

## 2016-06-21 LAB — COMPREHENSIVE METABOLIC PANEL
ALT: 18 U/L (ref 0–35)
AST: 14 U/L (ref 0–37)
Albumin: 3.8 g/dL (ref 3.5–5.2)
Alkaline Phosphatase: 97 U/L (ref 39–117)
BUN: 23 mg/dL (ref 6–23)
CHLORIDE: 111 meq/L (ref 96–112)
CO2: 21 mEq/L (ref 19–32)
CREATININE: 1.26 mg/dL — AB (ref 0.40–1.20)
Calcium: 9.6 mg/dL (ref 8.4–10.5)
GFR: 44.24 mL/min — ABNORMAL LOW (ref >60.00)
GLUCOSE: 96 mg/dL (ref 70–99)
POTASSIUM: 4.1 meq/L (ref 3.5–5.1)
SODIUM: 141 meq/L (ref 135–145)
TOTAL PROTEIN: 6.7 g/dL (ref 6.0–8.3)
Total Bilirubin: 0.3 mg/dL (ref 0.2–1.2)

## 2016-06-21 LAB — POCT URINALYSIS DIPSTICK
BILIRUBIN UA: NEGATIVE
Glucose, UA: NEGATIVE
KETONES UA: NEGATIVE
NITRITE UA: NEGATIVE
PH UA: 5.5
Protein, UA: NEGATIVE
RBC UA: NEGATIVE
Spec Grav, UA: 1.02
Urobilinogen, UA: 0.2

## 2016-06-21 LAB — MICROALBUMIN / CREATININE URINE RATIO
Creatinine,U: 36.1 mg/dL
MICROALB/CREAT RATIO: 1.9 mg/g (ref 0.0–30.0)
Microalb, Ur: <0.7 mg/dL (ref 0.0–1.9)

## 2016-06-21 LAB — HEMOGLOBIN A1C: Hgb A1c MFr Bld: 6.3 % (ref 4.6–6.5)

## 2016-06-21 MED ORDER — ELUXADOLINE 75 MG PO TABS: 1.0000 | ORAL_TABLET | Freq: Two times a day (BID) | ORAL | 2 refills | Status: DC

## 2016-06-21 NOTE — Progress Notes (Signed)
Patient ID: Stephanie Mckenzie, female    DOB: 1941/08/17  Age: 75 y.o. MRN: [id number]    Subjective:  Subjective  HPI Stephanie Mckenzie presents for f/u dm, cholesterol and htn.  She c/o edema and inc in ibs and diarrhea .  HPI HYPERTENSION   Blood pressure range-not checking   Chest pain- no      Dyspnea- no Lightheadedness- no   Edema- yes  Other side effects - no   Medication compliance: good Low salt diet- yes    DIABETES    Blood Sugar ranges-not checking  Polyuria- no New Visual problems- no  Hypoglycemic symptoms- no  Other side effects-no Medication compliance - not on any Last eye exam- done Foot exam- today   HYPERLIPIDEMIA  Medication compliance- good RUQ pain- no  Muscle aches- no Other side effects-no   Review of Systems  Constitutional: Negative for appetite change, diaphoresis, fatigue and unexpected weight change.  Eyes: Negative for pain, redness and visual disturbance.  Respiratory: Negative for cough, chest tightness, shortness of breath and wheezing.   Cardiovascular: Positive for leg swelling. Negative for chest pain and palpitations.  Endocrine: Negative for cold intolerance, heat intolerance, polydipsia, polyphagia and polyuria.  Genitourinary: Negative for difficulty urinating, dysuria and frequency.  Neurological: Negative for dizziness, light-headedness, numbness and headaches.    History Past Medical History:  Diagnosis Date  . Depression   . Edema 10/09/2015  . Hypertension   . Migraine     She has a past surgical history that includes Cesarean section; Appendectomy; Parathyroidectomy; Laminectomy; Coronary angioplasty; and Abdominal hysterectomy.   Her family history includes Diabetes in her mother; Hypertension in her mother; Thyroid disease in her mother.She reports that she has quit smoking. Her smoking use included Cigarettes. She has never used smokeless tobacco. She reports that she drinks alcohol. She reports that she does not use  drugs.  Current Outpatient Prescriptions on File Prior to Visit  Medication Sig Dispense Refill  . alendronate (FOSAMAX) 70 MG tablet Take 1 tablet (70 mg total) by mouth every 7 (seven) days. Take with a full glass of water on an empty stomach. 4 tablet 11  . aspirin 81 MG tablet Take 81 mg by mouth daily.    Marland Kitchen atorvastatin (LIPITOR) 40 MG tablet Take 1 tablet (40 mg total) by mouth daily. 90 tablet 1  . CARTIA XT 180 MG 24 hr capsule TAKE ONE CAPSULE BY MOUTH ONCE DAILY 90 capsule 0  . FLUoxetine (PROZAC) 40 MG capsule Take 1 capsule by mouth daily.    . fluticasone (FLONASE) 50 MCG/ACT nasal spray Place 2 sprays into both nostrils daily. 16 g 6  . folic acid (FOLVITE) 1 MG tablet TAKE ONE TABLET BY MOUTH ONCE DAILY 30 tablet 0  . furosemide (LASIX) 20 MG tablet TAKE ONE TABLET BY MOUTH ONCE DAILY AS NEEDED FOR  EDEMA 15 tablet 2  . glucose blood test strip Check blood sugar no more than three times daily. 300 each 5  . metoprolol succinate (TOPROL XL) 50 MG 24 hr tablet Take 1 tablet (50 mg total) by mouth 2 (two) times daily. 60 tablet 5  . mirtazapine (REMERON) 30 MG tablet Take 30 mg by mouth at bedtime.    Marland Kitchen nystatin (MYCOSTATIN/NYSTOP) 100000 UNIT/GM POWD Apply up to qid 60 g 1  . omeprazole (PRILOSEC) 40 MG capsule Take 40 mg by mouth 2 (two) times daily.    . ONE TOUCH LANCETS MISC Check blood sugar no more than three  times daily 300 each 5  . oxybutynin (DITROPAN XL) 15 MG 24 hr tablet Take 1 tablet (15 mg total) by mouth daily. 30 tablet 5  . potassium chloride (K-DUR) 10 MEQ tablet TAKE ONE TABLET BY MOUTH THREE TIMES DAILY 90 tablet 2  . torsemide (DEMADEX) 20 MG tablet TAKE TWO TABLETS BY MOUTH ONCE DAILY 60 tablet 2  . Vitamin D, Ergocalciferol, (DRISDOL) 50000 units CAPS capsule TAKE ONE CAPSULE BY MOUTH TWICE A WEEK 8 capsule 0  . zolpidem (AMBIEN) 5 MG tablet Take 5 mg by mouth once.    Marland Kitchen glipiZIDE (GLUCOTROL) 5 MG tablet Take 1 tablet (5 mg total) by mouth daily before  breakfast. (Patient not taking: Reported on 06/21/2016) 30 tablet 5   No current facility-administered medications on file prior to visit.      Objective:  Objective  Physical Exam  Constitutional: She is oriented to person, place, and time. She appears well-developed and well-nourished.  HENT:  Head: Normocephalic and atraumatic.  Eyes: Conjunctivae and EOM are normal.  Neck: Normal range of motion. Neck supple. No JVD present. Carotid bruit is not present. No thyromegaly present.  Cardiovascular: Normal rate, regular rhythm and normal heart sounds.   No murmur heard. Pulmonary/Chest: Effort normal and breath sounds normal. No respiratory distress. She has no wheezes. She has no rales. She exhibits no tenderness.  Musculoskeletal: She exhibits edema.  Neurological: She is alert and oriented to person, place, and time.  Psychiatric: She has a normal mood and affect. Her behavior is normal. Judgment and thought content normal.  Nursing note and vitals reviewed.  BP (!) 147/81 (BP Location: Right Arm, Patient Position: Sitting, Cuff Size: Large)   Pulse 75   Temp 97.9 F (36.6 C) (Oral)   Ht 5\' 4"  (1.626 m)   Wt 241 lb 6.4 oz (109.5 kg)   SpO2 97%   BMI 41.44 kg/m  Wt Readings from Last 3 Encounters:  06/21/16 241 lb 6.4 oz (109.5 kg)  05/31/16 242 lb 3.2 oz (109.9 kg)  03/22/16 234 lb 9.6 oz (106.4 kg)     Lab Results  Component Value Date   WBC 7.7 01/26/2016   HGB 12.6 01/26/2016   HCT 38.6 01/26/2016   PLT 323.0 01/26/2016   GLUCOSE 47 (LL) 05/31/2016   CHOL 151 01/26/2016   TRIG 322.0 (H) 01/26/2016   HDL 36.80 (L) 01/26/2016   LDLDIRECT 83.0 01/26/2016   LDLCALC 83 05/26/2015   ALT 13 05/31/2016   AST 13 05/31/2016   NA 140 05/31/2016   K 4.3 05/31/2016   CL 110 05/31/2016   CREATININE 1.91 (H) 05/31/2016   BUN 38 (H) 05/31/2016   CO2 23 05/31/2016   TSH 1.51 01/26/2016   INR 0.88 12/27/2009   HGBA1C 6.3 05/31/2016    US Venous Img Lower Unilateral  Left  Result Date: 05/31/2016 CLINICAL DATA:  Left-sided knee pain, initial encounter EXAM: Left LOWER EXTREMITY VENOUS DOPPLER ULTRASOUND TECHNIQUE: Gray-scale sonography with graded compression, as well as color Doppler and duplex ultrasound were performed to evaluate the lower extremity deep venous systems from the level of the common femoral vein and including the common femoral, femoral, profunda femoral, popliteal and calf veins including the posterior tibial, peroneal and gastrocnemius veins when visible. The superficial great saphenous vein was also interrogated. Spectral Doppler was utilized to evaluate flow at rest and with distal augmentation maneuvers in the common femoral, femoral and popliteal veins. COMPARISON:  None. FINDINGS: Contralateral Common Femoral Vein: Respiratory phasicity  is normal and symmetric with the symptomatic side. No evidence of thrombus. Normal compressibility. Common Femoral Vein: No evidence of thrombus. Normal compressibility, respiratory phasicity and response to augmentation. Saphenofemoral Junction: No evidence of thrombus. Normal compressibility and flow on color Doppler imaging. Profunda Femoral Vein: No evidence of thrombus. Normal compressibility and flow on color Doppler imaging. Femoral Vein: No evidence of thrombus. Normal compressibility, respiratory phasicity and response to augmentation. Popliteal Vein: No evidence of thrombus. Normal compressibility, respiratory phasicity and response to augmentation. Calf Veins: No evidence of thrombus. Normal compressibility and flow on color Doppler imaging. Superficial Great Saphenous Vein: No evidence of thrombus. Normal compressibility and flow on color Doppler imaging. Venous Reflux:  None. Other Findings: Mild subcutaneous edema is noted in the calf and thigh IMPRESSION: No evidence of deep venous thrombosis. Electronically Signed   By: Inez Catalina M.D.   On: 05/31/2016 16:18     Assessment & Plan:  Plan  I have  discontinued Ms. Shorten's predniSONE. I am also having her start on Eluxadoline. Additionally, I am having her maintain her FLUoxetine, atorvastatin, aspirin, glipiZIDE, metoprolol succinate, zolpidem, mirtazapine, omeprazole, nystatin, fluticasone, oxybutynin, Vitamin D (Ergocalciferol), glucose blood, ONE TOUCH LANCETS, torsemide, furosemide, alendronate, potassium chloride, folic acid, and CARTIA XT.  Meds ordered this encounter  Medications  . Eluxadoline (VIBERZI) 75 MG TABS    Sig: Take 1 tablet by mouth 2 (two) times daily.    Dispense:  60 tablet    Refill:  2    Problem List Items Addressed This Visit      Unprioritized   IBS (irritable bowel syndrome) - Primary    Refer back to GI Try [employer] fiber in diet Probiotic        Relevant Medications   Eluxadoline (VIBERZI) 75 MG TABS   Other Relevant Orders   Ambulatory referral to Gastroenterology   CBC with Differential/Platelet   Comprehensive metabolic panel   Fecal occult blood, imunochemical    Other Visit Diagnoses    Bilateral edema of lower extremity       Relevant Orders   ECHOCARDIOGRAM COMPLETE   CBC with Differential/Platelet   Lipid panel   Comprehensive metabolic panel   Hemoglobin A1c   Microalbumin / creatinine urine ratio   POCT urinalysis dipstick (Completed)   DM (diabetes mellitus) type II uncontrolled, periph vascular disorder (HCC)       Relevant Orders   CBC with Differential/Platelet   Lipid panel   Comprehensive metabolic panel   Hemoglobin A1c   Microalbumin / creatinine urine ratio   POCT urinalysis dipstick (Completed)   Essential hypertension       Relevant Orders   CBC with Differential/Platelet   Sleep apnea       Relevant Orders   Ambulatory referral to Pulmonology      Follow-up: Return in about 3 months (around 09/21/2016) for hypertension, diabetes II.  Ann Held, DO

## 2016-06-21 NOTE — Progress Notes (Signed)
Pre visit review using our clinic review tool, if applicable. No additional management support is needed unless otherwise documented below in the visit note. 

## 2016-06-21 NOTE — Assessment & Plan Note (Signed)
Refer back to GI Try [employer] fiber in diet Probiotic

## 2016-06-21 NOTE — Patient Instructions (Signed)
DASH Eating Plan  DASH stands for "Dietary Approaches to Stop Hypertension." The DASH eating plan is a healthy eating plan that has been shown to reduce high blood pressure (hypertension). Additional health benefits may include reducing the risk of type 2 diabetes mellitus, heart disease, and stroke. The DASH eating plan may also help with weight loss.  WHAT DO I NEED TO KNOW ABOUT THE DASH EATING PLAN?  For the DASH eating plan, you will follow these general guidelines:  · Choose foods with a percent daily value for sodium of less than 5% (as listed on the food label).  · Use salt-free seasonings or herbs instead of table salt or sea salt.  · Check with your health care provider or pharmacist before using salt substitutes.  · Eat lower-sodium products, often labeled as "lower sodium" or "no salt added."  · Eat fresh foods.  · Eat more vegetables, fruits, and low-fat dairy products.  · Choose whole grains. Look for the word "whole" as the first word in the ingredient list.  · Choose fish and skinless chicken or turkey more often than red meat. Limit fish, poultry, and meat to 6 oz (170 g) each day.  · Limit sweets, desserts, sugars, and sugary drinks.  · Choose heart-healthy fats.  · Limit cheese to 1 oz (28 g) per day.  · Eat more home-cooked food and less restaurant, buffet, and fast food.  · Limit fried foods.  · Cook foods using methods other than frying.  · Limit canned vegetables. If you do use them, rinse them well to decrease the sodium.  · When eating at a restaurant, ask that your food be prepared with less salt, or no salt if possible.  WHAT FOODS CAN I EAT?  Seek help from a dietitian for individual calorie needs.  Grains  Whole grain or whole wheat bread. Brown rice. Whole grain or whole wheat pasta. Quinoa, bulgur, and whole grain cereals. Low-sodium cereals. Corn or whole wheat flour tortillas. Whole grain cornbread. Whole grain crackers. Low-sodium crackers.  Vegetables  Fresh or frozen vegetables  (raw, steamed, roasted, or grilled). Low-sodium or reduced-sodium tomato and vegetable juices. Low-sodium or reduced-sodium tomato sauce and paste. Low-sodium or reduced-sodium canned vegetables.   Fruits  All fresh, canned (in natural juice), or frozen fruits.  Meat and Other Protein Products  Ground beef (85% or leaner), grass-fed beef, or beef trimmed of fat. Skinless chicken or turkey. Ground chicken or turkey. Pork trimmed of fat. All fish and seafood. Eggs. Dried beans, peas, or lentils. Unsalted nuts and seeds. Unsalted canned beans.  Dairy  Low-fat dairy products, such as skim or 1% milk, 2% or reduced-fat cheeses, low-fat ricotta or cottage cheese, or plain low-fat yogurt. Low-sodium or reduced-sodium cheeses.  Fats and Oils  Tub margarines without trans fats. Light or reduced-fat mayonnaise and salad dressings (reduced sodium). Avocado. Safflower, olive, or canola oils. Natural peanut or almond butter.  Other  Unsalted popcorn and pretzels.  The items listed above may not be a complete list of recommended foods or beverages. Contact your dietitian for more options.  WHAT FOODS ARE NOT RECOMMENDED?  Grains  White bread. White pasta. White rice. Refined cornbread. Bagels and croissants. Crackers that contain trans fat.  Vegetables  Creamed or fried vegetables. Vegetables in a cheese sauce. Regular canned vegetables. Regular canned tomato sauce and paste. Regular tomato and vegetable juices.  Fruits  Dried fruits. Canned fruit in light or heavy syrup. Fruit juice.  Meat and Other Protein   Products  Fatty cuts of meat. Ribs, chicken wings, bacon, sausage, bologna, salami, chitterlings, fatback, hot dogs, bratwurst, and packaged luncheon meats. Salted nuts and seeds. Canned beans with salt.  Dairy  Whole or 2% milk, cream, half-and-half, and cream cheese. Whole-fat or sweetened yogurt. Full-fat cheeses or blue cheese. Nondairy creamers and whipped toppings. Processed cheese, cheese spreads, or cheese  curds.  Condiments  Onion and garlic salt, seasoned salt, table salt, and sea salt. Canned and packaged gravies. Worcestershire sauce. Tartar sauce. Barbecue sauce. Teriyaki sauce. Soy sauce, including reduced sodium. Steak sauce. Fish sauce. Oyster sauce. Cocktail sauce. Horseradish. Ketchup and mustard. Meat flavorings and tenderizers. Bouillon cubes. Hot sauce. Tabasco sauce. Marinades. Taco seasonings. Relishes.  Fats and Oils  Butter, stick margarine, lard, shortening, ghee, and bacon fat. Coconut, palm kernel, or palm oils. Regular salad dressings.  Other  Pickles and olives. Salted popcorn and pretzels.  The items listed above may not be a complete list of foods and beverages to avoid. Contact your dietitian for more information.  WHERE CAN I FIND MORE INFORMATION?  National Heart, Lung, and [employer] Institute: www.nhlbi.nih.gov/health/health-topics/topics/dash/     This information is not intended to replace advice given to you by your health care provider. Make sure you discuss any questions you have with your health care provider.     Document Released: 11/01/2011 Document Revised: 12/03/2014 Document Reviewed: 09/16/2013  Elsevier Interactive Patient Education ©2016 Elsevier Inc.

## 2016-06-22 ENCOUNTER — Other Ambulatory Visit: Payer: Self-pay | Admitting: Family Medicine

## 2016-06-22 DIAGNOSIS — E785 Hyperlipidemia, unspecified: Secondary | ICD-10-CM

## 2016-06-23 LAB — URINE CULTURE

## 2016-06-25 ENCOUNTER — Telehealth: Payer: Self-pay | Admitting: *Deleted

## 2016-06-25 NOTE — Telephone Encounter (Signed)
error 

## 2016-06-25 NOTE — Telephone Encounter (Addendum)
PA initiated on [email or web address] [id number] Awaiting response.//AB/CMA

## 2016-06-27 ENCOUNTER — Other Ambulatory Visit (HOSPITAL_BASED_OUTPATIENT_CLINIC_OR_DEPARTMENT_OTHER): Payer: Self-pay

## 2016-06-28 NOTE — Telephone Encounter (Signed)
Caller nameBlase Mess PA department  Call back number: [phone]  Reason for call:  calling regarding PA no other information wasn't give EOC/Reference #  [id number]

## 2016-07-04 ENCOUNTER — Ambulatory Visit (HOSPITAL_BASED_OUTPATIENT_CLINIC_OR_DEPARTMENT_OTHER)
Admission: RE | Admit: 2016-07-04 | Discharge: 2016-07-04 | Disposition: A | Discharge status: Home / Self Care | Payer: PPO | Source: Ambulatory Visit | Attending: Family Medicine | Admitting: Family Medicine

## 2016-07-04 DIAGNOSIS — I119 Hypertensive heart disease without heart failure: Secondary | ICD-10-CM | POA: Insufficient documentation

## 2016-07-04 DIAGNOSIS — R600 Localized edema: Secondary | ICD-10-CM | POA: Insufficient documentation

## 2016-07-04 DIAGNOSIS — Z6841 Body Mass Index (BMI) 40.0 and over, adult: Secondary | ICD-10-CM | POA: Insufficient documentation

## 2016-07-04 DIAGNOSIS — E119 Type 2 diabetes mellitus without complications: Secondary | ICD-10-CM | POA: Diagnosis not present

## 2016-07-04 DIAGNOSIS — E669 Obesity, unspecified: Secondary | ICD-10-CM | POA: Diagnosis not present

## 2016-07-04 DIAGNOSIS — Z87891 Personal history of nicotine dependence: Secondary | ICD-10-CM | POA: Insufficient documentation

## 2016-07-04 DIAGNOSIS — R0600 Dyspnea, unspecified: Secondary | ICD-10-CM | POA: Diagnosis not present

## 2016-07-04 MED ORDER — PERFLUTREN LIPID MICROSPHERE
1.0000 mL | INTRAVENOUS | Status: AC | PRN
  Administered 2016-07-04: 2 mL via INTRAVENOUS
  Filled 2016-07-04: qty 10

## 2016-07-04 MED FILL — Perflutren Lipid Microsphere IV Susp 1.1 MG/ML: INTRAVENOUS | Qty: 10 | Status: AC

## 2016-07-04 NOTE — Progress Notes (Signed)
  Echocardiogram 2D Echocardiogram with Definity has been performed.  Tresa Res 07/04/2016, 11:14 AM

## 2016-07-05 ENCOUNTER — Ambulatory Visit: Payer: Self-pay

## 2016-07-06 NOTE — Telephone Encounter (Signed)
Spoke back with the pt and she stated that she was not able to find out the name of the other medication she had taken.    Placed Coverage Determination Request Form along with formulary alternatives in folder for Dr. Carollee Herter to review and complete.//AB/CMA

## 2016-07-06 NOTE — Telephone Encounter (Signed)
Spoke with the pt and she was able to give me some more information regarding what medications she has tried and has failed.  Patient stated that she has tried a medication but not sure of what the name is.   Patient will be calling her old doctors office and will try to get that information.   Patient will be calling back.//AB/CMA

## 2016-07-06 NOTE — Telephone Encounter (Signed)
Called and [facility] @ 11:11am @ [phone]) asking the pt to RTC regarding PA which was denied.  Needing more information.//AB/CMA   Received denial letter for the Viberzi.  Working on the Coverage Determination Request Form.//AB/CMA

## 2016-07-09 NOTE — Telephone Encounter (Signed)
Pt is returning CMA's call    CB: [phone]

## 2016-07-10 ENCOUNTER — Encounter: Payer: PPO | Attending: Medical

## 2016-07-10 DIAGNOSIS — K219 Gastro-esophageal reflux disease without esophagitis: Secondary | ICD-10-CM | POA: Diagnosis not present

## 2016-07-10 DIAGNOSIS — R197 Diarrhea, unspecified: Secondary | ICD-10-CM | POA: Diagnosis not present

## 2016-07-11 ENCOUNTER — Telehealth: Payer: Self-pay | Admitting: Family Medicine

## 2016-07-11 NOTE — Telephone Encounter (Signed)
Returned daughter's call.  Daughter is on patient's DPR.  Results and recommendations discussed.  Daughter was very appreciative for the call back and stated understanding based on the information shared.  Daughter says that mother is just frustrated that the swelling persists and seems to be worsening over time.  She also says that she's not with her mother all the time and so she's not sure if mother is following provider's recommendations (medication and treatment plan) as prescribed and only has her word to depend on.  Daughter was made aware that patient has an appt tomorrow with Dr. Etter Sjogren and tomorrow we assess the situation further.  Daughter agreed.  Daughter was reminded that if patient's symptoms worsen or there are any concerns, to take patient to the ER.  Daughter stated understanding and agreed.

## 2016-07-11 NOTE — Telephone Encounter (Signed)
Pt's daughter Ailene Ravel [id number] called in to speak with nurse. Pt spoke with nurse earlier in regards to x--ray result. Pt would like to have nurse re-explain to her daughter as she wasn't completely clear at understanding.     CB: [phone]

## 2016-07-12 ENCOUNTER — Ambulatory Visit (INDEPENDENT_AMBULATORY_CARE_PROVIDER_SITE_OTHER): Payer: PPO | Admitting: Family Medicine

## 2016-07-12 ENCOUNTER — Ambulatory Visit: Payer: Self-pay

## 2016-07-12 VITALS — BP 118/74 | HR 87 | Temp 98.5°F | Wt 244.4 lb

## 2016-07-12 DIAGNOSIS — R600 Localized edema: Secondary | ICD-10-CM | POA: Diagnosis not present

## 2016-07-12 DIAGNOSIS — I519 Heart disease, unspecified: Secondary | ICD-10-CM | POA: Diagnosis not present

## 2016-07-12 DIAGNOSIS — I5189 Other ill-defined heart diseases: Secondary | ICD-10-CM

## 2016-07-12 MED ORDER — FUROSEMIDE 20 MG PO TABS: ORAL_TABLET | ORAL | 2 refills | Status: DC

## 2016-07-12 MED ORDER — NONFORMULARY OR COMPOUNDED ITEM: 0 refills | Status: AC

## 2016-07-12 NOTE — Telephone Encounter (Signed)
Received Coverage Determination Request Form completed and faxed to [employer].//AB/CMA

## 2016-07-12 NOTE — Patient Instructions (Signed)
Edema °Edema is an abnormal buildup of fluids in your body tissues. Edema is somewhat dependent on gravity to pull the fluid to the lowest place in your body. That makes the condition more common in the legs and thighs (lower extremities). Painless swelling of the feet and ankles is common and becomes more likely as you get older. It is also common in looser tissues, like around your eyes.  °When the affected area is squeezed, the fluid may move out of that spot and leave a dent for a few moments. This dent is called pitting.  °CAUSES  °There are many possible causes of edema. Eating too much salt and being on your feet or sitting for a long time can cause edema in your legs and ankles. Hot weather may make edema worse. Common medical causes of edema include: °· Heart failure. °· Liver disease. °· Kidney disease. °· Weak blood vessels in your legs. °· Cancer. °· An injury. °· Pregnancy. °· Some medications. °· Obesity.  °SYMPTOMS  °Edema is usually painless. Your skin may look swollen or shiny.  °DIAGNOSIS  °Your health care provider may be able to diagnose edema by asking about your medical history and doing a physical exam. You may need to have tests such as X-rays, an electrocardiogram, or blood tests to check for medical conditions that may cause edema.  °TREATMENT  °Edema treatment depends on the cause. If you have heart, liver, or kidney disease, you need the treatment appropriate for these conditions. General treatment may include: °· Elevation of the affected body part above the level of your heart. °· Compression of the affected body part. Pressure from elastic bandages or support stockings squeezes the tissues and forces fluid back into the blood vessels. This keeps fluid from entering the tissues. °· Restriction of fluid and salt intake. °· Use of a water pill (diuretic). These medications are appropriate only for some types of edema. They pull fluid out of your body and make you urinate more often. This  gets rid of fluid and reduces swelling, but diuretics can have side effects. Only use diuretics as directed by your health care provider. °HOME CARE INSTRUCTIONS  °· Keep the affected body part above the level of your heart when you are lying down.   °· Do not sit still or stand for prolonged periods.   °· Do not put anything directly under your knees when lying down. °· Do not wear constricting clothing or garters on your upper legs.   °· Exercise your legs to work the fluid back into your blood vessels. This may help the swelling go down.   °· Wear elastic bandages or support stockings to reduce ankle swelling as directed by your health care provider.   °· Eat a low-salt diet to reduce fluid if your health care provider recommends it.   °· Only take medicines as directed by your health care provider.  °SEEK MEDICAL CARE IF:  °· Your edema is not responding to treatment. °· You have heart, liver, or kidney disease and notice symptoms of edema. °· You have edema in your legs that does not improve after elevating them.   °· You have sudden and unexplained weight gain. °SEEK IMMEDIATE MEDICAL CARE IF:  °· You develop shortness of breath or chest pain.   °· You cannot breathe when you lie down. °· You develop pain, redness, or warmth in the swollen areas.   °· You have heart, liver, or kidney disease and suddenly get edema. °· You have a fever and your symptoms suddenly get worse. °MAKE SURE YOU:  °·   Understand these instructions. °· Will watch your condition. °· Will get help right away if you are not doing well or get worse. °  °This information is not intended to replace advice given to you by your health care provider. Make sure you discuss any questions you have with your health care provider. °  °Document Released: 11/12/2005 Document Revised: 12/03/2014 Document Reviewed: 09/04/2013 °Elsevier Interactive Patient Education ©2016 Elsevier Inc. ° °

## 2016-07-12 NOTE — Progress Notes (Signed)
Pre visit review using our clinic review tool, if applicable. No additional management support is needed unless otherwise documented below in the visit note. 

## 2016-07-12 NOTE — Progress Notes (Signed)
Patient ID: Stephanie Mckenzie, female    DOB: February 27, 1941  Age: 75 y.o. MRN: [id number]    Subjective:  Subjective  HPI Stephanie Mckenzie presents for f/u edema--- it is better today but normally very tight and swelling ---- she was wheezing this am but is better now  No chest pain or sob.  Pt has seen cardiology in the past at [facility] but it has been more than 6 months.    Review of Systems  Constitutional: Negative for activity change, appetite change, fatigue and unexpected weight change.  Respiratory: Negative for cough and shortness of breath.   Cardiovascular: Positive for leg swelling. Negative for chest pain and palpitations.  Psychiatric/Behavioral: Negative for behavioral problems and dysphoric mood. The patient is not nervous/anxious.     History Past Medical History:  Diagnosis Date  . Depression   . Edema 10/09/2015  . Hypertension   . Migraine     She has a past surgical history that includes Cesarean section; Appendectomy; Parathyroidectomy; Laminectomy; Coronary angioplasty; and Abdominal hysterectomy.   Her family history includes Diabetes in her mother; Hypertension in her mother; Thyroid disease in her mother.She reports that she has quit smoking. Her smoking use included Cigarettes. She has never used smokeless tobacco. She reports that she drinks alcohol. She reports that she does not use drugs.  Current Outpatient Prescriptions on File Prior to Visit  Medication Sig Dispense Refill  . alendronate (FOSAMAX) 70 MG tablet Take 1 tablet (70 mg total) by mouth every 7 (seven) days. Take with a full glass of water on an empty stomach. 4 tablet 11  . aspirin 81 MG tablet Take 81 mg by mouth daily.    Marland Kitchen atorvastatin (LIPITOR) 40 MG tablet TAKE ONE TABLET BY MOUTH ONCE DAILY 90 tablet 1  . CARTIA XT 180 MG 24 hr capsule TAKE ONE CAPSULE BY MOUTH ONCE DAILY 90 capsule 0  . Eluxadoline (VIBERZI) 75 MG TABS Take 1 tablet by mouth 2 (two) times daily. 60 tablet 2  . FLUoxetine  (PROZAC) 40 MG capsule Take 1 capsule by mouth daily.    . fluticasone (FLONASE) 50 MCG/ACT nasal spray Place 2 sprays into both nostrils daily. 16 g 6  . folic acid (FOLVITE) 1 MG tablet TAKE ONE TABLET BY MOUTH ONCE DAILY 30 tablet 0  . glucose blood test strip Check blood sugar no more than three times daily. 300 each 5  . metoprolol succinate (TOPROL XL) 50 MG 24 hr tablet Take 1 tablet (50 mg total) by mouth 2 (two) times daily. 60 tablet 5  . mirtazapine (REMERON) 30 MG tablet Take 30 mg by mouth at bedtime.    Marland Kitchen nystatin (MYCOSTATIN/NYSTOP) 100000 UNIT/GM POWD Apply up to qid 60 g 1  . omeprazole (PRILOSEC) 40 MG capsule Take 40 mg by mouth 2 (two) times daily.    . ONE TOUCH LANCETS MISC Check blood sugar no more than three times daily 300 each 5  . oxybutynin (DITROPAN XL) 15 MG 24 hr tablet Take 1 tablet (15 mg total) by mouth daily. 30 tablet 5  . potassium chloride (K-DUR) 10 MEQ tablet TAKE ONE TABLET BY MOUTH THREE TIMES DAILY 90 tablet 2  . torsemide (DEMADEX) 20 MG tablet TAKE TWO TABLETS BY MOUTH ONCE DAILY 60 tablet 2  . Vitamin D, Ergocalciferol, (DRISDOL) 50000 units CAPS capsule TAKE ONE CAPSULE BY MOUTH TWICE A WEEK 8 capsule 0  . zolpidem (AMBIEN) 5 MG tablet Take 5 mg by mouth once.    Marland Kitchen  glipiZIDE (GLUCOTROL) 5 MG tablet Take 1 tablet (5 mg total) by mouth daily before breakfast. (Patient not taking: Reported on 06/21/2016) 30 tablet 5   No current facility-administered medications on file prior to visit.      Objective:  Objective  Physical Exam  Constitutional: She is oriented to person, place, and time. She appears well-developed and well-nourished.  HENT:  Head: Normocephalic and atraumatic.  Eyes: Conjunctivae and EOM are normal.  Neck: Normal range of motion. Neck supple. No JVD present. Carotid bruit is not present. No thyromegaly present.  Cardiovascular: Normal rate, regular rhythm and normal heart sounds.   No murmur heard. Pulmonary/Chest: Effort normal  and breath sounds normal. No respiratory distress. She has no wheezes. She has no rales. She exhibits no tenderness.  Musculoskeletal: She exhibits edema.       Right ankle: She exhibits swelling.       Left ankle: She exhibits swelling.       Feet:  Neurological: She is alert and oriented to person, place, and time.  Psychiatric: She has a normal mood and affect.  Nursing note and vitals reviewed.  BP 118/74 (BP Location: Left Arm, Patient Position: Sitting, Cuff Size: Large)   Pulse 87   Temp 98.5 F (36.9 C) (Oral)   Wt 244 lb 6.4 oz (110.9 kg)   SpO2 98%   BMI 41.95 kg/m  Wt Readings from Last 3 Encounters:  07/12/16 244 lb 6.4 oz (110.9 kg)  06/21/16 241 lb 6.4 oz (109.5 kg)  05/31/16 242 lb 3.2 oz (109.9 kg)     Lab Results  Component Value Date   WBC 7.3 06/21/2016   HGB 12.5 06/21/2016   HCT 38.3 06/21/2016   PLT 256.0 06/21/2016   GLUCOSE 96 06/21/2016   CHOL 165 06/21/2016   TRIG 197.0 (H) 06/21/2016   HDL 40.90 06/21/2016   LDLDIRECT 83.0 01/26/2016   LDLCALC 85 06/21/2016   ALT 18 06/21/2016   AST 14 06/21/2016   NA 141 06/21/2016   K 4.1 06/21/2016   CL 111 06/21/2016   CREATININE 1.26 (H) 06/21/2016   BUN 23 06/21/2016   CO2 21 06/21/2016   TSH 1.51 01/26/2016   INR 0.88 12/27/2009   HGBA1C 6.3 06/21/2016   MICROALBUR <0.7 06/21/2016    No results found.   Assessment & Plan:  Plan  I am having Stephanie Mckenzie start on NONFORMULARY OR COMPOUNDED ITEM. I am also having her maintain her FLUoxetine, aspirin, glipiZIDE, metoprolol succinate, zolpidem, mirtazapine, omeprazole, nystatin, fluticasone, oxybutynin, Vitamin D (Ergocalciferol), glucose blood, ONE TOUCH LANCETS, torsemide, alendronate, potassium chloride, folic acid, CARTIA XT, Eluxadoline, atorvastatin, and furosemide.  Meds ordered this encounter  Medications  . furosemide (LASIX) 20 MG tablet    Sig: TAKE ONE TABLET BY MOUTH ONCE DAILY AS NEEDED FOR  EDEMA    Dispense:  30 tablet    Refill:   2  . NONFORMULARY OR COMPOUNDED ITEM    Sig: Compression stockings 20-30 mg hg #1 pair    Dispense:  1 each    Refill:  0    Problem List Items Addressed This Visit      Unprioritized   Diastolic dysfunction    con't diuretics Refer to cardiology con't compression stockings and elevation      Relevant Orders   Ambulatory referral to Cardiology    Other Visit Diagnoses    Edema of lower extremity, unspecified laterality    -  Primary   Relevant Medications   furosemide (  LASIX) 20 MG tablet   NONFORMULARY OR COMPOUNDED ITEM   Other Relevant Orders   Ambulatory referral to Cardiology      Follow-up: No Follow-up on file.  Ann Held, DO

## 2016-07-13 ENCOUNTER — Encounter: Payer: Self-pay | Admitting: Family Medicine

## 2016-07-13 DIAGNOSIS — I5189 Other ill-defined heart diseases: Secondary | ICD-10-CM | POA: Insufficient documentation

## 2016-07-13 NOTE — Assessment & Plan Note (Signed)
con't diuretics Refer to cardiology con't compression stockings and elevation

## 2016-07-15 ENCOUNTER — Other Ambulatory Visit: Payer: Self-pay | Admitting: Family Medicine

## 2016-07-15 DIAGNOSIS — K589 Irritable bowel syndrome without diarrhea: Secondary | ICD-10-CM

## 2016-07-17 ENCOUNTER — Ambulatory Visit: Payer: Self-pay

## 2016-07-18 DIAGNOSIS — F419 Anxiety disorder, unspecified: Secondary | ICD-10-CM | POA: Diagnosis not present

## 2016-07-18 DIAGNOSIS — R197 Diarrhea, unspecified: Secondary | ICD-10-CM | POA: Diagnosis not present

## 2016-07-18 DIAGNOSIS — F331 Major depressive disorder, recurrent, moderate: Secondary | ICD-10-CM | POA: Diagnosis not present

## 2016-07-20 ENCOUNTER — Telehealth: Payer: Self-pay

## 2016-07-20 NOTE — Telephone Encounter (Signed)
Request for recent lab work/offce notes/diagnostics performed since May 31, 2016 received from [facility], [facility] via fax.    Records printed and faxed to [facility].

## 2016-07-24 ENCOUNTER — Ambulatory Visit: Payer: Self-pay

## 2016-07-25 ENCOUNTER — Other Ambulatory Visit: Payer: Self-pay | Admitting: Family Medicine

## 2016-07-25 ENCOUNTER — Ambulatory Visit: Payer: Self-pay | Admitting: *Deleted

## 2016-07-26 DIAGNOSIS — E1169 Type 2 diabetes mellitus with other specified complication: Secondary | ICD-10-CM | POA: Diagnosis not present

## 2016-07-26 DIAGNOSIS — D649 Anemia, unspecified: Secondary | ICD-10-CM | POA: Diagnosis not present

## 2016-07-26 DIAGNOSIS — N183 Chronic kidney disease, stage 3 (moderate): Secondary | ICD-10-CM | POA: Diagnosis not present

## 2016-07-26 DIAGNOSIS — R809 Proteinuria, unspecified: Secondary | ICD-10-CM | POA: Diagnosis not present

## 2016-07-26 DIAGNOSIS — K219 Gastro-esophageal reflux disease without esophagitis: Secondary | ICD-10-CM | POA: Diagnosis not present

## 2016-07-26 DIAGNOSIS — I10 Essential (primary) hypertension: Secondary | ICD-10-CM | POA: Diagnosis not present

## 2016-07-26 DIAGNOSIS — E211 Secondary hyperparathyroidism, not elsewhere classified: Secondary | ICD-10-CM | POA: Diagnosis not present

## 2016-07-26 DIAGNOSIS — E785 Hyperlipidemia, unspecified: Secondary | ICD-10-CM | POA: Diagnosis not present

## 2016-07-26 DIAGNOSIS — E039 Hypothyroidism, unspecified: Secondary | ICD-10-CM | POA: Diagnosis not present

## 2016-07-26 DIAGNOSIS — N250 Renal osteodystrophy: Secondary | ICD-10-CM | POA: Diagnosis not present

## 2016-07-26 DIAGNOSIS — E669 Obesity, unspecified: Secondary | ICD-10-CM | POA: Diagnosis not present

## 2016-07-26 DIAGNOSIS — N189 Chronic kidney disease, unspecified: Secondary | ICD-10-CM | POA: Diagnosis not present

## 2016-07-27 NOTE — Telephone Encounter (Signed)
Received approved letter for the Viber[employer] via from [employer] on (07/15/16).  Sent to be scanned.//AB/CMA    Received denied letter for the Viberzi via fax from [employer] on (06/29/16).  Sent to be scanned.//AB/CMA

## 2016-08-02 ENCOUNTER — Other Ambulatory Visit: Payer: Self-pay | Admitting: Family Medicine

## 2016-08-07 ENCOUNTER — Telehealth: Payer: Self-pay | Admitting: Family Medicine

## 2016-08-07 DIAGNOSIS — K648 Other hemorrhoids: Secondary | ICD-10-CM | POA: Diagnosis not present

## 2016-08-07 DIAGNOSIS — R197 Diarrhea, unspecified: Secondary | ICD-10-CM | POA: Diagnosis not present

## 2016-08-07 DIAGNOSIS — K639 Disease of intestine, unspecified: Secondary | ICD-10-CM | POA: Diagnosis not present

## 2016-08-07 DIAGNOSIS — K5730 Diverticulosis of large intestine without perforation or abscess without bleeding: Secondary | ICD-10-CM | POA: Diagnosis not present

## 2016-08-07 DIAGNOSIS — Z86010 Personal history of colonic polyps: Secondary | ICD-10-CM | POA: Diagnosis not present

## 2016-08-07 DIAGNOSIS — Z0000 Encounter for general adult medical examination without abnormal findings: Secondary | ICD-10-CM | POA: Diagnosis not present

## 2016-08-07 NOTE — Telephone Encounter (Signed)
Labs were sent to pt Pt needs longer time to be seen

## 2016-08-07 NOTE — Telephone Encounter (Signed)
Pt's daughter Ailene [id number] called in, she says that she and pt's nephrologist has a few concerns about pt's health. She says that she would like to know if PCP could see her sooner than scheduled appt? She says that when she and the pt walks pt gets short of breath. Looking to schedule pt for sooner appt with PCP. Please advise for scheduling?    ALSO, pt's daughter would like to know if pt's results are back in?     CBKarie Kirks [phone]

## 2016-08-07 NOTE — Telephone Encounter (Signed)
Please advise      KP 

## 2016-08-07 NOTE — Telephone Encounter (Signed)
Message left to call the office.    KP 

## 2016-08-08 IMAGING — US US EXTREM LOW VENOUS*L*
1 series · 13 of 19 positions shown · non-contrast
Comparison: None.

CLINICAL DATA: Left-sided knee pain, initial encounter



[Series 1: us extrem low venous*left* · 0.10mm/px · 13 of 19 slices shown]
[im 1/19]
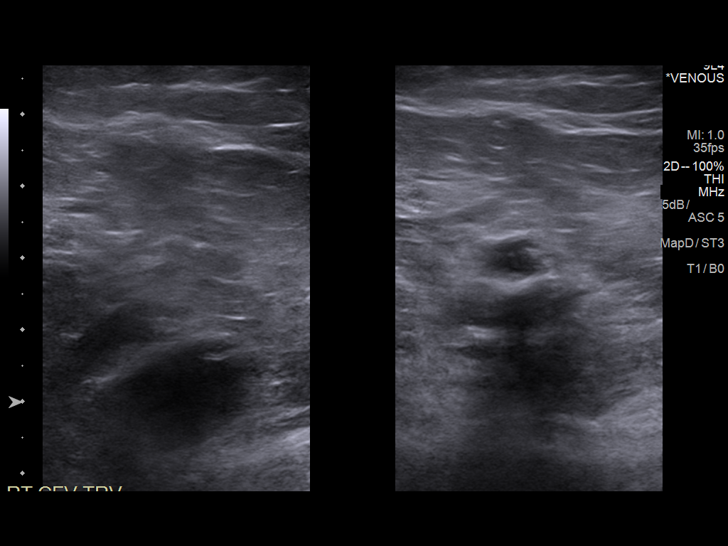
[im 3/19]
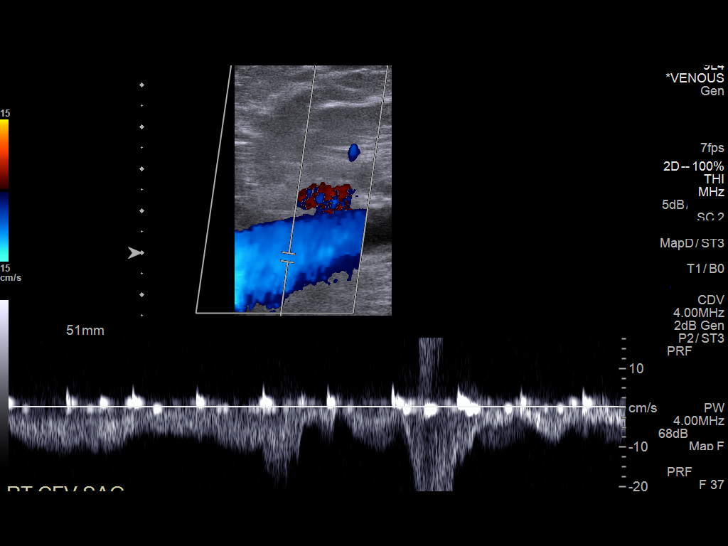
[im 4/19]
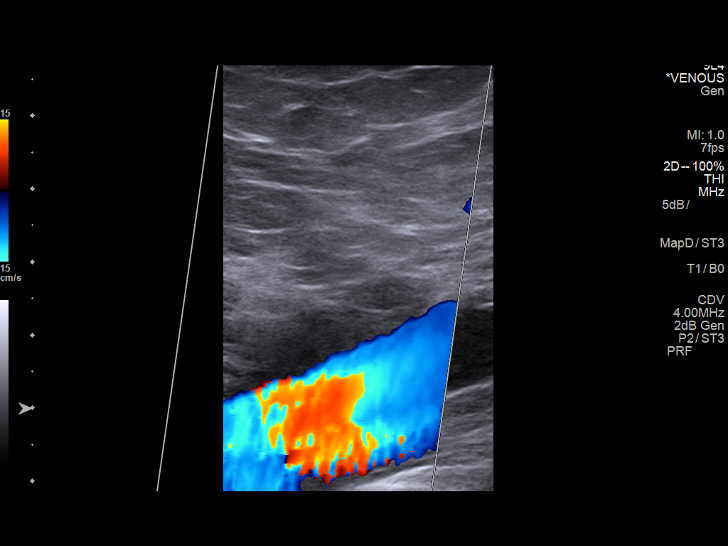
[im 6/19]
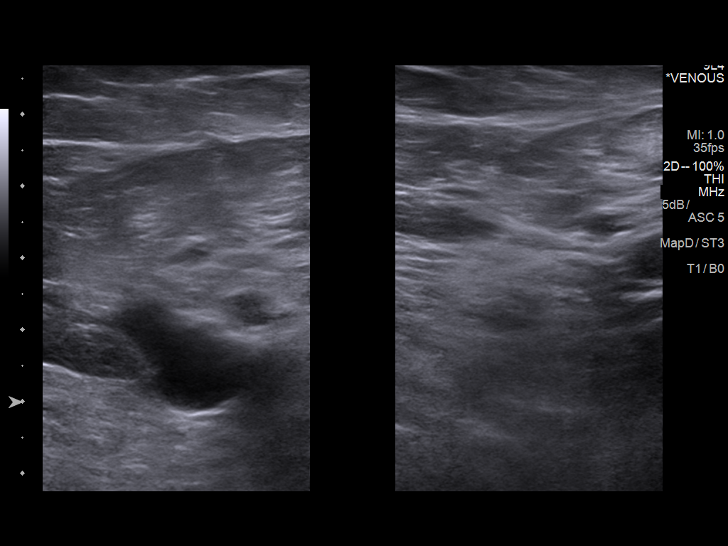
[im 7/19]
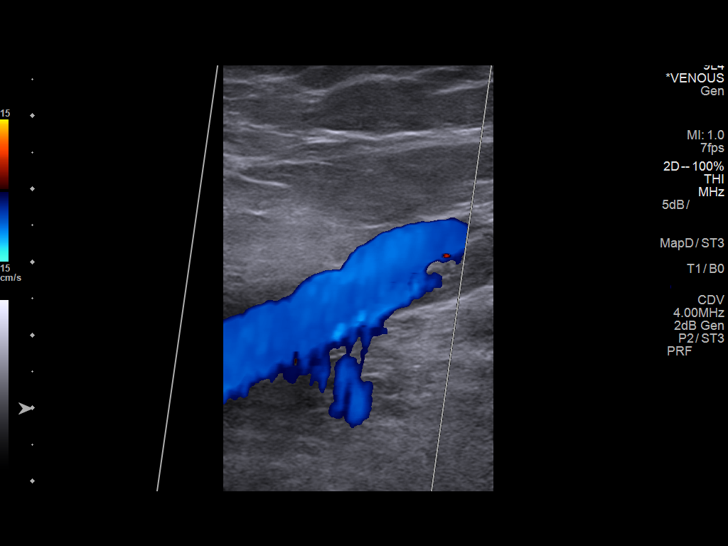
[im 9/19]
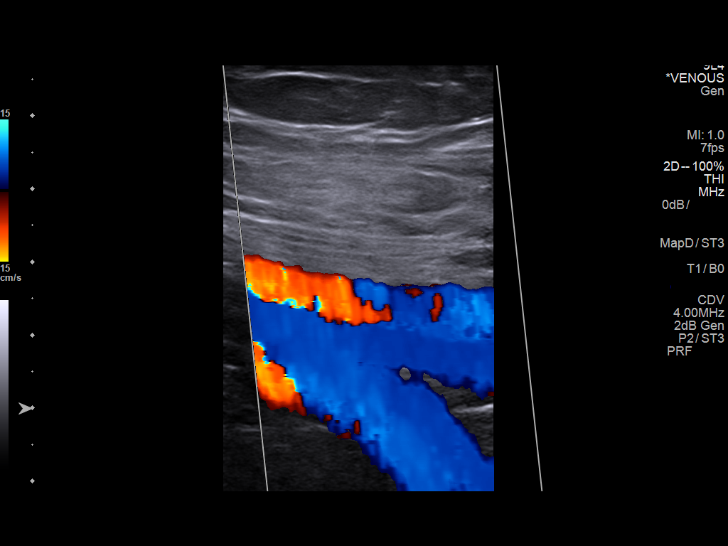
[im 10/19]
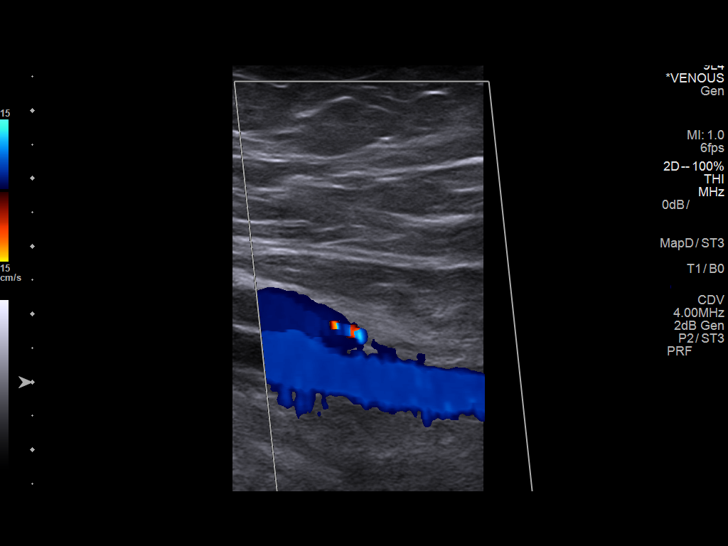
[im 11/19]
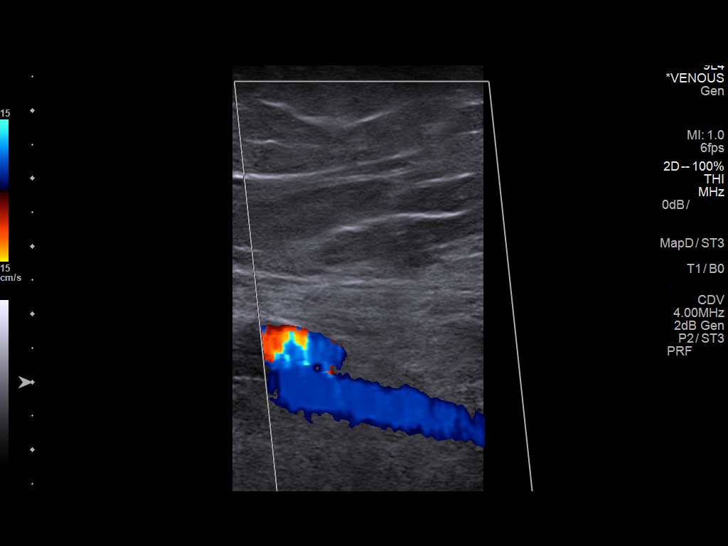
[im 13/19]
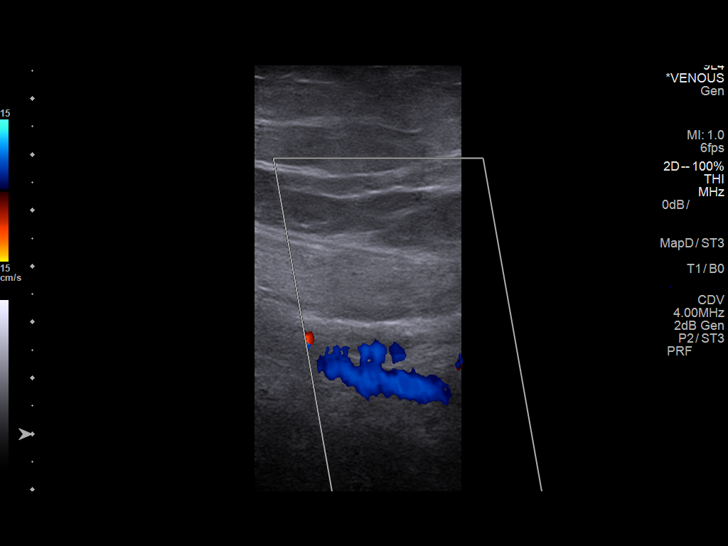
[im 14/19]
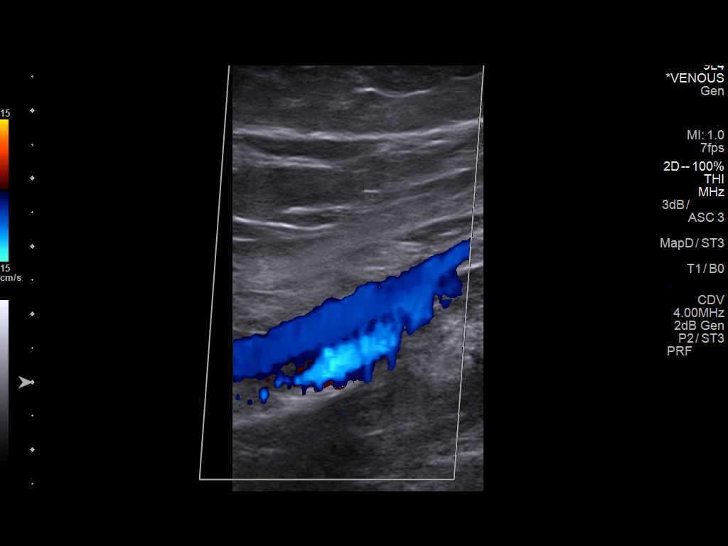
[im 16/19]
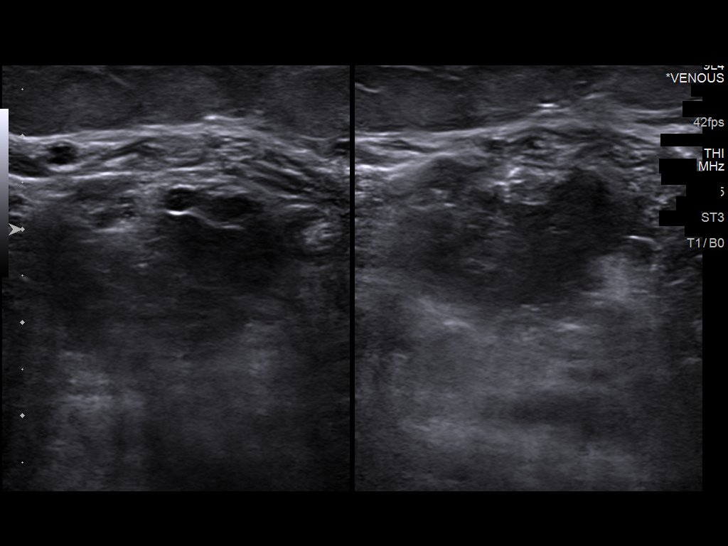
[im 17/19]
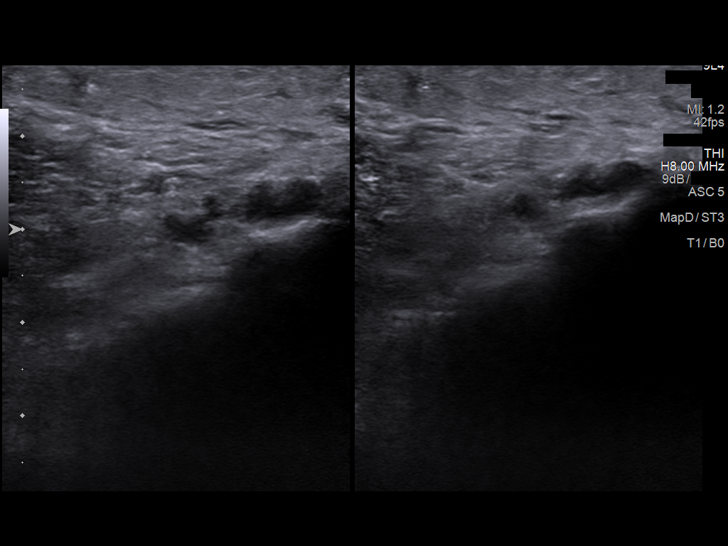
[im 19/19]
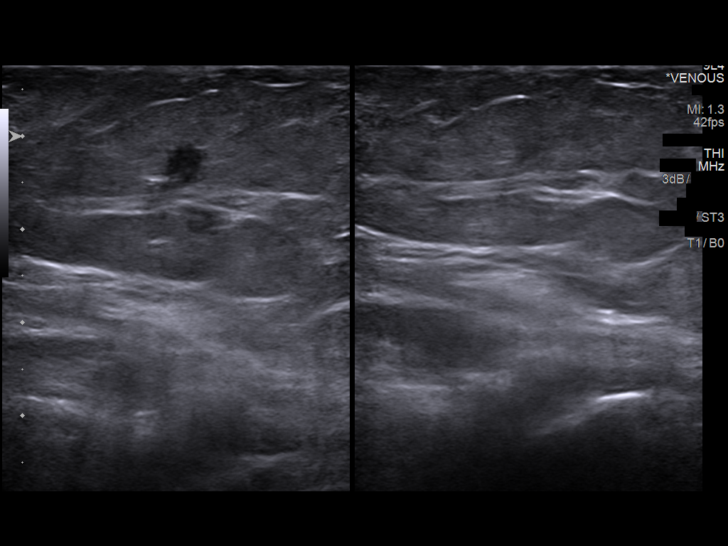

[13 of 19 positions shown; findings below may reference images not displayed]

FINDINGS: Contralateral Common Femoral Vein: Respiratory phasicity is normal
and symmetric with the symptomatic side. No evidence of thrombus.
Normal compressibility.

Common Femoral Vein: No evidence of thrombus. Normal
compressibility, respiratory phasicity and response to augmentation.

Saphenofemoral Junction: No evidence of thrombus. Normal
compressibility and flow on color Doppler imaging.

Profunda Femoral Vein: No evidence of thrombus. Normal
compressibility and flow on color Doppler imaging.

Femoral Vein: No evidence of thrombus. Normal compressibility,
respiratory phasicity and response to augmentation.

Popliteal Vein: No evidence of thrombus. Normal compressibility,
respiratory phasicity and response to augmentation.

Calf Veins: No evidence of thrombus. Normal compressibility and flow
on color Doppler imaging.

Superficial Great Saphenous Vein: No evidence of thrombus. Normal
compressibility and flow on color Doppler imaging.

Venous Reflux:  None.

Other Findings: Mild subcutaneous edema is noted in the calf and
thigh
IMPRESSION: No evidence of deep venous thrombosis.

## 2016-08-08 NOTE — Telephone Encounter (Signed)
If she is that sob-- she need s to go to er

## 2016-08-08 NOTE — Telephone Encounter (Signed)
Mother had a colonoscopy yesterday and daughter said when she was preparing her for the visit she did not have on her tennis shoes, she had a bed room shoes. She is having sever swelling in her feet and difficulty breathing w/o exertion. Daughter feels things are progressing and mother is getting worst. She said Keoshia told her she feels like she is going to die.  Patient is swelling all over the body. I scheduled her for tomorrow at 10:30.     KP

## 2016-08-09 ENCOUNTER — Encounter: Payer: Self-pay | Admitting: Family Medicine

## 2016-08-09 ENCOUNTER — Ambulatory Visit (HOSPITAL_BASED_OUTPATIENT_CLINIC_OR_DEPARTMENT_OTHER)
Admission: RE | Admit: 2016-08-09 | Discharge: 2016-08-09 | Disposition: A | Discharge status: Home / Self Care | Payer: PPO | Source: Ambulatory Visit | Attending: Family Medicine | Admitting: Family Medicine

## 2016-08-09 ENCOUNTER — Ambulatory Visit (INDEPENDENT_AMBULATORY_CARE_PROVIDER_SITE_OTHER): Payer: PPO | Admitting: Family Medicine

## 2016-08-09 VITALS — BP 128/82 | HR 87 | Temp 99.0°F | Resp 17 | Ht 64.0 in | Wt 244.0 lb

## 2016-08-09 DIAGNOSIS — R0602 Shortness of breath: Secondary | ICD-10-CM | POA: Diagnosis not present

## 2016-08-09 DIAGNOSIS — I700 Atherosclerosis of aorta: Secondary | ICD-10-CM | POA: Diagnosis not present

## 2016-08-09 DIAGNOSIS — I2510 Atherosclerotic heart disease of native coronary artery without angina pectoris: Secondary | ICD-10-CM | POA: Diagnosis not present

## 2016-08-09 DIAGNOSIS — I519 Heart disease, unspecified: Secondary | ICD-10-CM

## 2016-08-09 DIAGNOSIS — R918 Other nonspecific abnormal finding of lung field: Secondary | ICD-10-CM | POA: Insufficient documentation

## 2016-08-09 DIAGNOSIS — R601 Generalized edema: Secondary | ICD-10-CM | POA: Diagnosis not present

## 2016-08-09 DIAGNOSIS — I10 Essential (primary) hypertension: Secondary | ICD-10-CM | POA: Diagnosis not present

## 2016-08-09 DIAGNOSIS — G4733 Obstructive sleep apnea (adult) (pediatric): Secondary | ICD-10-CM | POA: Diagnosis not present

## 2016-08-09 DIAGNOSIS — I5189 Other ill-defined heart diseases: Secondary | ICD-10-CM

## 2016-08-09 DIAGNOSIS — J9801 Acute bronchospasm: Secondary | ICD-10-CM

## 2016-08-09 DIAGNOSIS — E782 Mixed hyperlipidemia: Secondary | ICD-10-CM | POA: Diagnosis not present

## 2016-08-09 MED ORDER — BECLOMETHASONE DIPROPIONATE 40 MCG/ACT IN AERS: 2.0000 | INHALATION_SPRAY | Freq: Two times a day (BID) | RESPIRATORY_TRACT | Status: DC

## 2016-08-09 MED ORDER — ALBUTEROL SULFATE HFA 108 (90 BASE) MCG/ACT IN AERS: 2.0000 | INHALATION_SPRAY | Freq: Four times a day (QID) | RESPIRATORY_TRACT | 4 refills | Status: DC | PRN

## 2016-08-09 MED ORDER — ALBUTEROL SULFATE (2.5 MG/3ML) 0.083% IN NEBU: 2.5000 mg | INHALATION_SOLUTION | Freq: Once | RESPIRATORY_TRACT | Status: DC

## 2016-08-09 NOTE — Progress Notes (Signed)
Patient ID: Stephanie Mckenzie, female    DOB: [birth year]  Age: 75 y.o. MRN: [id number]    Subjective:  Subjective  HPI Stephanie Mckenzie presents for f/u echo--- she c/o sob and swelling in her legs.  Her daughter is with her.  She has an appointment with cardiology later today.  No chest pain.    Review of Systems  Constitutional: Negative for activity change, appetite change, fatigue and unexpected weight change.  Respiratory: Positive for shortness of breath and wheezing. Negative for cough.   Cardiovascular: Negative for chest pain and palpitations.  Psychiatric/Behavioral: Negative for behavioral problems and dysphoric mood. The patient is not nervous/anxious.     History Past Medical History:  Diagnosis Date  . Depression   . Edema 10/09/2015  . Hypertension   . Migraine     She has a past surgical history that includes Cesarean section; Appendectomy; Parathyroidectomy; Laminectomy; Coronary angioplasty; and Abdominal hysterectomy.   Her family history includes Diabetes in her mother; Hypertension in her mother; Thyroid disease in her mother.She reports that she has quit smoking. Her smoking use included Cigarettes. She has never used smokeless tobacco. She reports that she drinks alcohol. She reports that she does not use drugs.  Current Outpatient Prescriptions on File Prior to Visit  Medication Sig Dispense Refill  . alendronate (FOSAMAX) 70 MG tablet Take 1 tablet (70 mg total) by mouth every 7 (seven) days. Take with a full glass of water on an empty stomach. 4 tablet 11  . aspirin 81 MG tablet Take 81 mg by mouth daily.    Marland Kitchen atorvastatin (LIPITOR) 40 MG tablet TAKE ONE TABLET BY MOUTH ONCE DAILY 90 tablet 1  . CARTIA XT 180 MG 24 hr capsule TAKE ONE CAPSULE BY MOUTH ONCE DAILY 90 capsule 0  . FLUoxetine (PROZAC) 40 MG capsule Take 1 capsule by mouth daily.    . fluticasone (FLONASE) 50 MCG/ACT nasal spray Place 2 sprays into both nostrils daily. 16 g 6  . folic acid (FOLVITE)  1 MG tablet TAKE ONE TABLET BY MOUTH ONCE DAILY 30 tablet 11  . furosemide (LASIX) 20 MG tablet TAKE ONE TABLET BY MOUTH ONCE DAILY AS NEEDED FOR  EDEMA 30 tablet 2  . glipiZIDE (GLUCOTROL) 5 MG tablet Take 1 tablet (5 mg total) by mouth daily before breakfast. 30 tablet 5  . glucose blood test strip Check blood sugar no more than three times daily. 300 each 5  . metoprolol succinate (TOPROL XL) 50 MG 24 hr tablet Take 1 tablet (50 mg total) by mouth 2 (two) times daily. 60 tablet 5  . mirtazapine (REMERON) 30 MG tablet Take 30 mg by mouth at bedtime.    . NONFORMULARY OR COMPOUNDED ITEM Compression stockings 20-30 mg hg #1 pair 1 each 0  . nystatin (MYCOSTATIN/NYSTOP) 100000 UNIT/GM POWD Apply up to qid 60 g 1  . omeprazole (PRILOSEC) 40 MG capsule Take 40 mg by mouth 2 (two) times daily.    . ONE TOUCH LANCETS MISC Check blood sugar no more than three times daily 300 each 5  . oxybutynin (DITROPAN XL) 15 MG 24 hr tablet TAKE ONE TABLET BY MOUTH ONCE DAILY 30 tablet 11  . potassium chloride (K-DUR) 10 MEQ tablet TAKE ONE TABLET BY MOUTH THREE TIMES DAILY 90 tablet 2  . torsemide (DEMADEX) 20 MG tablet TAKE TWO TABLETS BY MOUTH ONCE DAILY 60 tablet 2  . VIBERZI 75 MG TABS TAKE ONE TABLET BY MOUTH TWICE DAILY 60 tablet  2  . Vitamin D, Ergocalciferol, (DRISDOL) 50000 units CAPS capsule TAKE ONE CAPSULE BY MOUTH TWICE A WEEK 8 capsule 0  . zolpidem (AMBIEN) 5 MG tablet Take 5 mg by mouth once.     No current facility-administered medications on file prior to visit.      Objective:  Objective  Physical Exam  Constitutional: She is oriented to person, place, and time. She appears well-developed and well-nourished.  HENT:  Head: Normocephalic and atraumatic.  Eyes: Conjunctivae and EOM are normal.  Neck: Normal range of motion. Neck supple. No JVD present. Carotid bruit is not present. No thyromegaly present.  Cardiovascular: Normal rate, regular rhythm and normal heart sounds.   No murmur  heard. Pulmonary/Chest: Effort normal. No respiratory distress. She has wheezes. She has no rales. She exhibits no tenderness.  After neb--  Lungs cleared  Musculoskeletal: She exhibits edema.       Right ankle: She exhibits swelling.       Left ankle: She exhibits swelling.  Neurological: She is alert and oriented to person, place, and time.  Psychiatric: She has a normal mood and affect.  Nursing note and vitals reviewed.  BP 128/82 (BP Location: Right Arm, Patient Position: Sitting, Cuff Size: Large)   Pulse 87   Temp 99 F (37.2 C) (Oral)   Resp 17   Ht 5\' 4"  (1.626 m)   Wt 244 lb (110.7 kg)   SpO2 95%   BMI 41.88 kg/m  Wt Readings from Last 3 Encounters:  08/09/16 244 lb (110.7 kg)  07/12/16 244 lb 6.4 oz (110.9 kg)  06/21/16 241 lb 6.4 oz (109.5 kg)     Lab Results  Component Value Date   WBC 7.3 06/21/2016   HGB 12.5 06/21/2016   HCT 38.3 06/21/2016   PLT 256.0 06/21/2016   GLUCOSE 96 06/21/2016   CHOL 165 06/21/2016   TRIG 197.0 (H) 06/21/2016   HDL 40.90 06/21/2016   LDLDIRECT 83.0 01/26/2016   LDLCALC 85 06/21/2016   ALT 18 06/21/2016   AST 14 06/21/2016   NA 141 06/21/2016   K 4.1 06/21/2016   CL 111 06/21/2016   CREATININE 1.26 (H) 06/21/2016   BUN 23 06/21/2016   CO2 21 06/21/2016   TSH 1.51 01/26/2016   INR 0.88 12/27/2009   HGBA1C 6.3 06/21/2016   MICROALBUR <0.7 06/21/2016    No results found.   Assessment & Plan:  Plan  I am having [name] start on albuterol. I am also having her maintain her FLUoxetine, aspirin, glipiZIDE, metoprolol succinate, zolpidem, mirtazapine, omeprazole, nystatin, fluticasone, Vitamin D (Ergocalciferol), glucose blood, ONE TOUCH LANCETS, alendronate, potassium chloride, CARTIA XT, atorvastatin, furosemide, NONFORMULARY OR COMPOUNDED ITEM, VIBERZI, torsemide, folic acid, and oxybutynin. We will continue to administer albuterol and beclomethasone.  Meds ordered this encounter  Medications  . albuterol (PROVENTIL)  (2.5 MG/3ML) 0.083% nebulizer solution 2.5 mg  . beclomethasone (QVAR) 40 MCG/ACT inhaler 2 puff  . albuterol (PROAIR HFA) 108 (90 Base) MCG/ACT inhaler    Sig: Inhale 2 puffs into the lungs every 6 (six) hours as needed for wheezing or shortness of breath.    Dispense:  1 Inhaler    Refill:  4    Problem List Items Addressed This Visit      Unprioritized   Diastolic dysfunction    Check cxr con't diuretics Cardio later today        Other Visit Diagnoses    SOB (shortness of breath)    -  Primary  Relevant Medications   albuterol (PROVENTIL) (2.5 MG/3ML) 0.083% nebulizer solution 2.5 mg   beclomethasone (QVAR) 40 MCG/ACT inhaler 2 puff   albuterol (PROAIR HFA) 108 (90 Base) MCG/ACT inhaler   Other Relevant Orders   DG Chest 2 View (Completed)   Bronchospasm       Relevant Medications   albuterol (PROAIR HFA) 108 (90 Base) MCG/ACT inhaler      Follow-up: Return in about 2 weeks (around 08/23/2016).  Ann Held, DO

## 2016-08-09 NOTE — Progress Notes (Signed)
Pre visit review using our clinic review tool, if applicable. No additional management support is needed unless otherwise documented below in the visit note. 

## 2016-08-09 NOTE — Patient Instructions (Signed)

## 2016-08-09 NOTE — Telephone Encounter (Signed)
Returned call to pt's daughter. She states pt was NOT SOB at rest, only DOE when walking into a restaurant. Reports yesterday the pt slept most of the day recovering from her colonoscopy. She has not talked to the pt yet today, states pt is still asleep and they do not live together. She will call the pt, and if she is any worse this morning or has SOB at rest, they will go to straight to ED instead of coming into office.

## 2016-08-09 NOTE — Telephone Encounter (Signed)
Pt seen today

## 2016-08-10 NOTE — Assessment & Plan Note (Signed)
Check cxr con't diuretics Cardio later today

## 2016-08-13 ENCOUNTER — Telehealth: Payer: Self-pay | Admitting: Family Medicine

## 2016-08-13 NOTE — Telephone Encounter (Signed)
°  Relationship to patient: Self   Can be reached: [phone]   Reason for call: Patient states that her co-pay was to high for her to get the the inhaler that was prescribed. Patient requesting an inhaler that is less expensive.

## 2016-08-13 NOTE — Telephone Encounter (Signed)
Can try albuterol inh instead with same instructions We may need to see her formulary for the steroid inhaler

## 2016-08-13 NOTE — Telephone Encounter (Signed)
Please advise      KP 

## 2016-08-14 DIAGNOSIS — E1165 Type 2 diabetes mellitus with hyperglycemia: Secondary | ICD-10-CM | POA: Diagnosis not present

## 2016-08-14 DIAGNOSIS — E782 Mixed hyperlipidemia: Secondary | ICD-10-CM | POA: Diagnosis not present

## 2016-08-14 DIAGNOSIS — R0609 Other forms of dyspnea: Secondary | ICD-10-CM | POA: Diagnosis not present

## 2016-08-14 DIAGNOSIS — Z794 Long term (current) use of insulin: Secondary | ICD-10-CM | POA: Diagnosis not present

## 2016-08-14 DIAGNOSIS — I10 Essential (primary) hypertension: Secondary | ICD-10-CM | POA: Diagnosis not present

## 2016-08-14 DIAGNOSIS — I2510 Atherosclerotic heart disease of native coronary artery without angina pectoris: Secondary | ICD-10-CM | POA: Diagnosis not present

## 2016-08-14 DIAGNOSIS — R601 Generalized edema: Secondary | ICD-10-CM | POA: Diagnosis not present

## 2016-08-14 DIAGNOSIS — G4733 Obstructive sleep apnea (adult) (pediatric): Secondary | ICD-10-CM | POA: Diagnosis not present

## 2016-08-14 DIAGNOSIS — R609 Edema, unspecified: Secondary | ICD-10-CM | POA: Diagnosis not present

## 2016-08-14 NOTE — Telephone Encounter (Signed)
-----   Message from Ann Held, [place] sent at 08/09/2016 12:25 PM EDT ----- We need ov notes from nephrologist in [place] -- [place] [address] [facility] --- she calls him Dr A and does not know his full name

## 2016-08-14 NOTE — Telephone Encounter (Signed)
Message left to call the office, in reference to the inhaler, she has 2 listed.   KP

## 2016-08-20 DIAGNOSIS — I509 Heart failure, unspecified: Secondary | ICD-10-CM | POA: Diagnosis not present

## 2016-08-20 DIAGNOSIS — E669 Obesity, unspecified: Secondary | ICD-10-CM | POA: Diagnosis not present

## 2016-08-20 DIAGNOSIS — N183 Chronic kidney disease, stage 3 (moderate): Secondary | ICD-10-CM | POA: Diagnosis not present

## 2016-08-20 DIAGNOSIS — R197 Diarrhea, unspecified: Secondary | ICD-10-CM | POA: Diagnosis not present

## 2016-08-20 DIAGNOSIS — E785 Hyperlipidemia, unspecified: Secondary | ICD-10-CM | POA: Diagnosis not present

## 2016-08-20 DIAGNOSIS — E1169 Type 2 diabetes mellitus with other specified complication: Secondary | ICD-10-CM | POA: Diagnosis not present

## 2016-08-20 DIAGNOSIS — R159 Full incontinence of feces: Secondary | ICD-10-CM | POA: Diagnosis not present

## 2016-08-20 DIAGNOSIS — K219 Gastro-esophageal reflux disease without esophagitis: Secondary | ICD-10-CM | POA: Diagnosis not present

## 2016-08-22 NOTE — Telephone Encounter (Signed)
Spoke with patient and the co-pay was $40 for her inhaler but she does not need it at this time because she is breathing much better, but she does not know which of the two inhalers was $40    KP

## 2016-08-22 NOTE — Telephone Encounter (Signed)
Message left to call the office.    KP 

## 2016-08-24 ENCOUNTER — Encounter: Payer: PPO | Attending: Family Medicine | Admitting: Dietician

## 2016-08-24 DIAGNOSIS — Z713 Dietary counseling and surveillance: Secondary | ICD-10-CM | POA: Insufficient documentation

## 2016-08-24 DIAGNOSIS — E118 Type 2 diabetes mellitus with unspecified complications: Secondary | ICD-10-CM | POA: Insufficient documentation

## 2016-08-24 DIAGNOSIS — E669 Obesity, unspecified: Secondary | ICD-10-CM | POA: Insufficient documentation

## 2016-08-24 DIAGNOSIS — E119 Type 2 diabetes mellitus without complications: Secondary | ICD-10-CM

## 2016-08-24 NOTE — Progress Notes (Signed)
Medical Nutrition Therapy:  Appt start time: 1050 end time:  1205.   Assessment:  Primary concerns today: Patient is here alone.  Referral is for type 2 diabetes.  Patient states that this was diagnosed in December 2016 but "I don't have it now".  "I was sick and on prednisone."  A1C 6.3% 06/21/16 decreased from 6.5% 10/2015.  She is also very concerned and upset about her weight gain.  She was crying and very short of breath at the beginning of our discussion but this resolved with decreased anxiety, water, and rest.  "I don't know why I am not losing weight.  I go to the gym twice a week."  Patient reports an IBW of 145 lbs until 4 years ago when she broke her ankle.  It increased some at that time and most of the weight gain since the Remeron was started.  Patient thinks that this was 1 year ago.  She also quit smoking around the same time.  Her weight today was 239 lbs decreased from 244 lbs 08/09/16 due to fluid loss.  Other hx includes HTN, edema, bipolar disorder, and GFR of 44 in July.  She reports severe self esteem and does not want to go out.  She also complains of severe pain from fibromyalgia.  Patient lives alone.  She does her shopping and cooking.  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  She states that she is very lonely and admits to not eating right but does not snack on sweets or chips.  She states that her children are Chiropractors and do not have time.  Preferred Learning Style:   No preference indicated   Learning Readiness:   Contemplating  MEDICATIONS: see list.  She states that the glipizide was discontinued because her blood sugar readings were good when she was checking them at home.     DIETARY INTAKE:  Usual eating pattern includes 2-3 meals and 1 snacks per day.  Everyday foods include easy foods, salt.  Avoided foods include "chips and sweets".    24-hr recall:  B ( AM): SKIPS or eggs and bacon or cereal   Snk ( AM): none  L (11-2 PM): cereal or  english muffin or sandwich or salad Snk ( PM): none D (5-6 PM): hamburger (out or at home) OR something easy OR J&S to get vegetables once per week OR salad Snk ( PM): clementine or ice cream sandwich Beverages: lemonade   Usual physical activity: sees a trainer at the gym twice per week and does different weights and aerobic activity.  She will do water aerobics but not recently.  She does walk the dog for 1/4 mile each night.  Estimated energy needs: 1400 calories 158 g carbohydrates 88 g protein 47 g fat  Progress Towards Goal(s):  In progress.   Nutritional Diagnosis:  NB-1.1 Food and nutrition-related knowledge deficit As related to balance of carbohydrate, protein, and fat.  As evidenced by diet hx and patient report.    Intervention:  Nutrition counseling/education regarding type 2 diabetes, insulin resistance, benefits of exercise and weight loss.  We discussed her medications and side effects and need to. Avoid grocery shopping when you are hungry.  Discussed her beverage intake, carbohydrate content and impact on health.  Discussed low sodium diet.  Discussed need for continued counseling and self care and acceptance.  Rethink what you drink.  Avoid any beverage with sugar.  Choose water, unsweetened tea, crystal lite, flavored water.  (Avoid dark  soda and any beverage with phosphorous in the ingredients.) Stay as active as possible.  Walk on the days that you don't go to the gym or go to the water class. Avoid added salt. Choose baked or grilled rather than fried. Choose vegetables that are fresh, frozen without salt, or canned without salt. Half your plate should be vegetables. A Quarter of your plate should be lean protein. A Quarter of your plate should be starch or fruit. Avoid skipping meals. Discuss the remeron, weight gain, and continued depression with your psychiatrist. Consider inquiring about counseling.  Teaching Method Utilized:  Visual Auditory Hands  on  Handouts given during visit include: Food Label handouts Meal Plan Card  Low sodium nutrition therapy from AND  My plate  Snack list  Weight loss magazine  Barriers to learning/adherence to lifestyle change: depression, hard of hearing, unable to read without glasses  Demonstrated degree of understanding via:  Teach Back   Monitoring/Evaluation:  Dietary intake, exercise, label reading, and body weight prn.

## 2016-08-24 NOTE — Patient Instructions (Addendum)
Avoid grocery shopping when you are hungry. Rethink what you drink.  Avoid any beverage with sugar.  Choose water, unsweetened tea, crystal lite, flavored water.  (Avoid dark soda and any beverage with phosphorous in the ingredients.) Stay as active as possible.  Walk on the days that you don't go to the gym or go to the water class. Avoid added salt. Choose baked or grilled rather than fried. Choose vegetables that are fresh, frozen without salt, or canned without salt. Half your plate should be vegetables. A Quarter of your plate should be lean protein. A Quarter of your plate should be starch or fruit. Avoid skipping meals. Discuss the remeron, weight gain, and continued depression with your psychiatrist. Consider inquiring about counseling.

## 2016-08-30 ENCOUNTER — Ambulatory Visit (INDEPENDENT_AMBULATORY_CARE_PROVIDER_SITE_OTHER): Payer: PPO | Admitting: Pulmonary Disease

## 2016-08-30 ENCOUNTER — Encounter: Payer: Self-pay | Admitting: Pulmonary Disease

## 2016-08-30 VITALS — BP 122/70 | HR 76 | Ht 64.0 in | Wt 240.0 lb

## 2016-08-30 DIAGNOSIS — G4733 Obstructive sleep apnea (adult) (pediatric): Secondary | ICD-10-CM | POA: Insufficient documentation

## 2016-08-30 NOTE — Assessment & Plan Note (Signed)
Given excessive daytime somnolence, narrow pharyngeal exam, witnessed apneas & loud snoring, obstructive sleep apnea is very likely & an overnight polysomnogram will be scheduled as a split study. The pathophysiology of obstructive sleep apnea , it's cardiovascular consequences & modes of treatment including CPAP were discused with the patient in detail & they evidenced understanding.  Pretest probability is high 

## 2016-08-30 NOTE — Patient Instructions (Signed)
Schedule sleep study Based on this we will set you up with a CPAP machine as required

## 2016-08-30 NOTE — Progress Notes (Signed)
Subjective:    Patient ID: Stephanie Mckenzie, female    DOB: [birth year], 75 y.o.   MRN: [id number]  HPI  Chief Complaint  Patient presents with  . Sleep Study    Referred by Dr. Etter Sjogren; tired, snoring, excessive weight gain; ES: 31   75 year old ex-smoker referred for evaluation of excessive daytime somnolence and sleep disordered breathing.  Epworth sleepiness score is 7 and she reports increased somnolence and sleepiness and radius social situations. She reports feeling tired all the time and occasionally wakes herself up by her snoring. She has gained about 100 pounds in the last 2-3 years to her current weight of 240 pounds. She reports poor hearing and an ankle fracture over the last few years which have decreased her mobility and caused weight gain. She is also struggling with bipolar disorder. She smoked about half pack per day for at least 50 years before she quit at age 58.  She has been using Ambien for a while. She underwent colonoscopy a couple of weeks ago and was told by anesthesia that she stopped breathing when she was under  Bedtime is between 11 PM and midnight, sleep latency is minimal, she sleeps on her side with 2 pillows, reports one to 2 nocturnal awakenings including nocturia and is out of bed by 10 AM feeling tired with dryness of mouth and occasional headache. Sometimes he stays in bed later.  She lives by herself so no bed partner history is available but loud snoring has been noted by family members and she has woken herself up by her own snoring on occasion    There is no history suggestive of cataplexy, sleep paralysis or parasomnias  Chart review and records from care everywhere were reviewed, seems like she has had PFT[facility] at [facility] available to review at present, she was placed on Symbicort but co-pays were high, she is only taking albuterol as needed       Past Medical History:  Diagnosis Date  . Depression   . Edema 10/09/2015  . Hypertension     . Migraine      Past Surgical History:  Procedure Laterality Date  . ABDOMINAL HYSTERECTOMY    . APPENDECTOMY    . CESAREAN SECTION     x 2  . CORONARY ANGIOPLASTY    . LAMINECTOMY    . PARATHYROIDECTOMY     Allergies  Allergen Reactions  . Dilaudid [Hydromorphone] Other (See Comments)    Per daughter cause confusion and hallucinations. Causes confusion and hallucinations per pt's daughter  . Lorazepam Rash and Other (See Comments)    Other reaction(s): Hallucinations hallucinations  hallucinations  hallucinations   . Acyclovir And Related   . Amlodipine Besylate     Pedal edema  . Morphine And Related Itching  . Buprenorphine Hcl Itching    Social History   Social History  . Marital status: Divorced    Spouse name: N/A  . Number of children: 2  . Years of education: N/A   Occupational History  . Retired    Social History Main Topics  . Smoking status: Former Smoker    Packs/day: 1.00    Years: 30.00    Types: Cigarettes    Quit date: 08/31/2015  . Smokeless tobacco: Never Used  . Alcohol use 0.0 oz/week     Comment: occasional  . Drug use: No  . Sexual activity: Not on file   Other Topics Concern  . Not on file   Social History  Narrative  . No narrative on file     Family History  Problem Relation Age of Onset  . Diabetes Mother   . Hypertension Mother   . Thyroid disease Mother      Review of Systems neg for any significant sore throat, dysphagia, itching, sneezing, nasal congestion or excess/ purulent secretions, fever, chills, sweats, unintended wt loss, pleuritic or exertional cp, hempoptysis, orthopnea pnd or change in chronic leg swelling.   Also denies presyncope, palpitations, heartburn, abdominal pain, nausea, vomiting, diarrhea or change in bowel or urinary habits, dysuria,hematuria, rash, arthralgias, visual complaints, headache, numbness weakness or ataxia.     Objective:   Physical Exam  Gen. Pleasant, obese, in no  distress, normal affect ENT - no lesions, no post nasal drip, class 2-3 airway Neck: No JVD, no thyromegaly, no carotid bruits Lungs: no use of accessory muscles, no dullness to percussion, decreased without rales or rhonchi  Cardiovascular: Rhythm regular, heart sounds  normal, no murmurs or gallops, 2+ peripheral edema Abdomen: soft and non-tender, no hepatosplenomegaly, BS normal. Musculoskeletal: No deformities, no cyanosis or clubbing Neuro:  alert, non focal, no tremors       Assessment & Plan:

## 2016-08-30 NOTE — Progress Notes (Signed)
   Subjective:    Patient ID: ADRIANNY SLISZ, female    DOB: [birth year], 75 y.o.   MRN: [id number]  HPI    Review of Systems  Constitutional: Negative for chills, fever and unexpected weight change.  HENT: Negative for congestion, dental problem, ear pain, nosebleeds, postnasal drip, rhinorrhea, sinus pressure, sneezing, sore throat, trouble swallowing and voice change.   Eyes: Negative for visual disturbance.  Respiratory: Negative for cough, choking and shortness of breath.   Cardiovascular: Negative for chest pain and leg swelling.  Gastrointestinal: Negative for abdominal pain, diarrhea and vomiting.  Genitourinary: Negative for difficulty urinating.  Musculoskeletal: Negative for arthralgias.  Skin: Negative for rash.  Neurological: Negative for tremors, syncope and headaches.  Hematological: Does not bruise/bleed easily.       Objective:   Physical Exam        Assessment & Plan:

## 2016-08-31 DIAGNOSIS — G4733 Obstructive sleep apnea (adult) (pediatric): Secondary | ICD-10-CM | POA: Diagnosis not present

## 2016-08-31 DIAGNOSIS — I2510 Atherosclerotic heart disease of native coronary artery without angina pectoris: Secondary | ICD-10-CM | POA: Diagnosis not present

## 2016-08-31 DIAGNOSIS — I10 Essential (primary) hypertension: Secondary | ICD-10-CM | POA: Diagnosis not present

## 2016-09-02 ENCOUNTER — Other Ambulatory Visit: Payer: Self-pay | Admitting: Family Medicine

## 2016-09-02 DIAGNOSIS — I10 Essential (primary) hypertension: Secondary | ICD-10-CM

## 2016-09-13 ENCOUNTER — Encounter: Payer: Self-pay | Admitting: Medical

## 2016-09-13 ENCOUNTER — Ambulatory Visit (INDEPENDENT_AMBULATORY_CARE_PROVIDER_SITE_OTHER): Payer: PPO | Admitting: Medical

## 2016-09-13 VITALS — BP 140/70 | HR 94 | Temp 97.9°F | Ht 64.0 in | Wt 244.2 lb

## 2016-09-13 DIAGNOSIS — R05 Cough: Secondary | ICD-10-CM

## 2016-09-13 DIAGNOSIS — R600 Localized edema: Secondary | ICD-10-CM | POA: Diagnosis not present

## 2016-09-13 DIAGNOSIS — J9801 Acute bronchospasm: Secondary | ICD-10-CM

## 2016-09-13 DIAGNOSIS — R062 Wheezing: Secondary | ICD-10-CM | POA: Diagnosis not present

## 2016-09-13 DIAGNOSIS — R0602 Shortness of breath: Secondary | ICD-10-CM

## 2016-09-13 DIAGNOSIS — R059 Cough, unspecified: Secondary | ICD-10-CM

## 2016-09-13 DIAGNOSIS — E559 Vitamin D deficiency, unspecified: Secondary | ICD-10-CM

## 2016-09-13 DIAGNOSIS — R7989 Other specified abnormal findings of blood chemistry: Secondary | ICD-10-CM

## 2016-09-13 MED ORDER — PREDNISONE 10 MG PO TABS: ORAL_TABLET | ORAL | 0 refills | Status: DC

## 2016-09-13 MED ORDER — AZITHROMYCIN 250 MG PO TABS: ORAL_TABLET | ORAL | 0 refills | Status: DC

## 2016-09-13 MED ORDER — ALBUTEROL SULFATE HFA 108 (90 BASE) MCG/ACT IN AERS: 2.0000 | INHALATION_SPRAY | Freq: Four times a day (QID) | RESPIRATORY_TRACT | 4 refills | Status: DC | PRN

## 2016-09-13 MED ORDER — BENZONATATE 100 MG PO CAPS: 100.0000 mg | ORAL_CAPSULE | Freq: Three times a day (TID) | ORAL | 0 refills | Status: DC | PRN

## 2016-09-13 NOTE — Patient Instructions (Addendum)
Your wheezing appears to have started from possible mold exposure and exposure to chemicals to clean the area.   I am writing tapered dose of prednisone and refilling your albuterol.For cough rx benzonatate.  I want you to get cxr tomorrow along with cbc, cmp and bnp.  If over upcoming weekend you develop infection type symptoms as discussed then start azithromycin.  Follow up in 7 days or as needed

## 2016-09-13 NOTE — Progress Notes (Signed)
Subjective:    Patient ID: Stephanie Mckenzie, female    DOB: 03-Mar-1941, 75 y.o.   MRN: [id number]  HPI  Pt in for evaluation.  Pt states she had some AC problems recently with leak. Some possible mold formed on some carpet. She was sleeping in that room and states carpet was very wet. 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  Pt slept in that room on Sunday.   Pt states house also got fumigated and cleaned by [employer]. Pt woke up this morning  with hacking cough and feeling wheezing. Cough is not productive. But no fever, no chills or sweats. No leg pain.   Pt is going to stay in hotel tonight.  Pt has had some pedal edema in past and had cxr. Pt has seen cardiologist. Pt not aware of any chf. Echo in August looked good.    Review of Systems  Constitutional: Negative for chills, fatigue and fever.  HENT: Negative for congestion, ear pain, nosebleeds, postnasal drip and sinus pressure.   Respiratory: Positive for cough and wheezing. Negative for choking, chest tightness and shortness of breath.   Cardiovascular: Negative for chest pain and palpitations.  Gastrointestinal: Negative for abdominal pain.  Musculoskeletal: Negative for back pain.  Skin: Negative for rash.  Neurological: Negative for dizziness and headaches.  Hematological: Negative for adenopathy. Does not bruise/bleed easily.  Psychiatric/Behavioral: Negative for behavioral problems and confusion.   Past Medical History:  Diagnosis Date  . Depression   . Edema 10/09/2015  . Hypertension   . Migraine      Social History   Social History  . Marital status: Divorced    Spouse name: N/A  . Number of children: 2  . Years of education: N/A   Occupational History  . Retired    Social History Main Topics  . Smoking status: Former Smoker    Packs/day: 1.00    Years: 30.00    Types: Cigarettes    Quit date: 08/31/2015  . Smokeless tobacco: Never Used  . Alcohol use 0.0 oz/week     Comment: occasional  .  Drug use: No  . Sexual activity: Not on file   Other Topics Concern  . Not on file   Social History Narrative  . No narrative on file    Past Surgical History:  Procedure Laterality Date  . ABDOMINAL HYSTERECTOMY    . APPENDECTOMY    . CESAREAN SECTION     x 2  . CORONARY ANGIOPLASTY    . LAMINECTOMY    . PARATHYROIDECTOMY      Family History  Problem Relation Age of Onset  . Diabetes Mother   . Hypertension Mother   . Thyroid disease Mother     Allergies  Allergen Reactions  . Dilaudid [Hydromorphone] Other (See Comments)    Per daughter cause confusion and hallucinations. Causes confusion and hallucinations per pt's daughter  . Lorazepam Rash and Other (See Comments)    Other reaction(s): Hallucinations hallucinations  hallucinations  hallucinations   . Acyclovir And Related   . Amlodipine Besylate     Pedal edema  . Morphine And Related Itching  . Buprenorphine Hcl Itching    Current Outpatient Prescriptions on File Prior to Visit  Medication Sig Dispense Refill  . alendronate (FOSAMAX) 70 MG tablet Take 1 tablet (70 mg total) by mouth every 7 (seven) days. Take with a full glass of water on an empty stomach. 4 tablet 11  . aspirin 81 MG tablet  Take 81 mg by mouth daily.    Marland Kitchen atorvastatin (LIPITOR) 40 MG tablet TAKE ONE TABLET BY MOUTH ONCE DAILY 90 tablet 1  . CARTIA XT 180 MG 24 hr capsule TAKE ONE CAPSULE BY MOUTH ONCE DAILY 90 capsule 0  . FLUoxetine (PROZAC) 40 MG capsule Take 1 capsule by mouth daily.    . fluticasone (FLONASE) 50 MCG/ACT nasal spray Place 2 sprays into both nostrils daily. 16 g 6  . folic acid (FOLVITE) 1 MG tablet TAKE ONE TABLET BY MOUTH ONCE DAILY 30 tablet 11  . furosemide (LASIX) 20 MG tablet TAKE ONE TABLET BY MOUTH ONCE DAILY AS NEEDED FOR  EDEMA 30 tablet 2  . glucose blood test strip Check blood sugar no more than three times daily. 300 each 5  . metoprolol succinate (TOPROL-XL) 50 MG 24 hr tablet TAKE ONE TABLET BY MOUTH  TWICE DAILY 60 tablet 5  . mirtazapine (REMERON) 30 MG tablet Take 30 mg by mouth at bedtime.    . NONFORMULARY OR COMPOUNDED ITEM Compression stockings 20-30 mg hg #1 pair 1 each 0  . nystatin (MYCOSTATIN/NYSTOP) 100000 UNIT/GM POWD Apply up to qid 60 g 1  . omeprazole (PRILOSEC) 40 MG capsule Take 40 mg by mouth 2 (two) times daily.    . ONE TOUCH LANCETS MISC Check blood sugar no more than three times daily 300 each 5  . oxybutynin (DITROPAN XL) 15 MG 24 hr tablet TAKE ONE TABLET BY MOUTH ONCE DAILY 30 tablet 11  . potassium chloride (K-DUR) 10 MEQ tablet TAKE ONE TABLET BY MOUTH THREE TIMES DAILY 90 tablet 2  . torsemide (DEMADEX) 20 MG tablet TAKE TWO TABLETS BY MOUTH ONCE DAILY 60 tablet 2  . zolpidem (AMBIEN) 5 MG tablet Take 5 mg by mouth once.     No current facility-administered medications on file prior to visit.     BP 140/70   Pulse 94   Temp 97.9 F (36.6 C) (Oral)   Ht 5\' 4"  (1.626 m)   Wt 244 lb 3.2 oz (110.8 kg)   SpO2 98%   BMI 41.92 kg/m       Objective:   Physical Exam  General  Mental Status - Alert. General Appearance - Well groomed. Not in acute distress.  Skin Rashes- No Rashes.  HEENT Head- Normal. Ear Auditory Canal - Left- Normal. Right - Normal.Tympanic Membrane- Left- Normal. Right- Normal. Eye Sclera/Conjunctiva- Left- Normal. Right- Normal. Nose & Sinuses Nasal Mucosa- Left-  Not Boggy and no Congested. Right-  Not Boggy and not  Congested.Bilateral  No maxillary and no  frontal sinus pressure. Mouth & Throat Lips: Upper Lip- Normal: no dryness, cracking, pallor, cyanosis, or vesicular eruption. Lower Lip-Normal: no dryness, cracking, pallor, cyanosis or vesicular eruption. Buccal Mucosa- Bilateral- No Aphthous ulcers. Oropharynx- No Discharge or Erythema. Tonsils: Characteristics- Bilateral- No Erythema or Congestion. Size/Enlargement- Bilateral- No enlargement. Discharge- bilateral-None.  Neck Neck- Supple. No Masses.no jvd   Chest  and Lung Exam Auscultation: Breath Sounds:-even and unlabored. But mild shallow respirations.  Cardiovascular Auscultation:Rythm- Regular, rate and rhythm. Murmurs & Other Heart Sounds:Ausculatation of the heart reveal- No Murmurs.  Lymphatic Head & Neck General Head & Neck Lymphatics: Bilateral: Description- No Localized lymphadenopathy.  Lower extra- bilateral faint  pedal edema. calfs symmetric. Neg homans signs.       Assessment & Plan:  Your wheezing appears to have started from possible mold exposure and exposure to chemicals to clean the area.  I am writing tapered dose  of prednisone and refilling your albuterol. For cough rx benzonatate  I want you to get cxr tomorrow along with cbc, cmp and bnp.  If over upcoming weekend you develop infection type symptoms as discussed then start azithromycin.  Follow up in 7 days or as needed  Lorrie Gargan, Percell Miller, [employer]

## 2016-09-14 ENCOUNTER — Ambulatory Visit (HOSPITAL_BASED_OUTPATIENT_CLINIC_OR_DEPARTMENT_OTHER)
Admission: RE | Admit: 2016-09-14 | Discharge: 2016-09-14 | Disposition: A | Discharge status: Home / Self Care | Payer: PPO | Source: Ambulatory Visit | Attending: Medical | Admitting: Medical

## 2016-09-14 ENCOUNTER — Telehealth: Payer: Self-pay | Admitting: Family Medicine

## 2016-09-14 ENCOUNTER — Other Ambulatory Visit (INDEPENDENT_AMBULATORY_CARE_PROVIDER_SITE_OTHER): Payer: PPO

## 2016-09-14 ENCOUNTER — Encounter (HOSPITAL_BASED_OUTPATIENT_CLINIC_OR_DEPARTMENT_OTHER): Payer: Self-pay | Admitting: Emergency Medicine

## 2016-09-14 ENCOUNTER — Ambulatory Visit (INDEPENDENT_AMBULATORY_CARE_PROVIDER_SITE_OTHER): Payer: PPO | Admitting: Physician Assistant

## 2016-09-14 ENCOUNTER — Emergency Department (HOSPITAL_BASED_OUTPATIENT_CLINIC_OR_DEPARTMENT_OTHER)
Admission: EM | Admit: 2016-09-14 | Discharge: 2016-09-14 | Disposition: A | Discharge status: Other Acute Inpatient Hospital | Payer: PPO | Attending: Emergency Medicine | Admitting: Emergency Medicine

## 2016-09-14 ENCOUNTER — Other Ambulatory Visit: Payer: Self-pay

## 2016-09-14 DIAGNOSIS — E559 Vitamin D deficiency, unspecified: Secondary | ICD-10-CM | POA: Diagnosis not present

## 2016-09-14 DIAGNOSIS — R0902 Hypoxemia: Secondary | ICD-10-CM

## 2016-09-14 DIAGNOSIS — R062 Wheezing: Secondary | ICD-10-CM | POA: Insufficient documentation

## 2016-09-14 DIAGNOSIS — E1122 Type 2 diabetes mellitus with diabetic chronic kidney disease: Secondary | ICD-10-CM | POA: Diagnosis not present

## 2016-09-14 DIAGNOSIS — I110 Hypertensive heart disease with heart failure: Secondary | ICD-10-CM | POA: Diagnosis not present

## 2016-09-14 DIAGNOSIS — R05 Cough: Secondary | ICD-10-CM

## 2016-09-14 DIAGNOSIS — R059 Cough, unspecified: Secondary | ICD-10-CM

## 2016-09-14 DIAGNOSIS — R739 Hyperglycemia, unspecified: Secondary | ICD-10-CM

## 2016-09-14 DIAGNOSIS — Z7982 Long term (current) use of aspirin: Secondary | ICD-10-CM | POA: Diagnosis not present

## 2016-09-14 DIAGNOSIS — I5023 Acute on chronic systolic (congestive) heart failure: Secondary | ICD-10-CM | POA: Insufficient documentation

## 2016-09-14 DIAGNOSIS — I509 Heart failure, unspecified: Secondary | ICD-10-CM

## 2016-09-14 DIAGNOSIS — R7989 Other specified abnormal findings of blood chemistry: Secondary | ICD-10-CM

## 2016-09-14 DIAGNOSIS — E875 Hyperkalemia: Secondary | ICD-10-CM | POA: Diagnosis not present

## 2016-09-14 DIAGNOSIS — J441 Chronic obstructive pulmonary disease with (acute) exacerbation: Secondary | ICD-10-CM | POA: Diagnosis not present

## 2016-09-14 DIAGNOSIS — E785 Hyperlipidemia, unspecified: Secondary | ICD-10-CM | POA: Diagnosis not present

## 2016-09-14 DIAGNOSIS — Z87891 Personal history of nicotine dependence: Secondary | ICD-10-CM | POA: Insufficient documentation

## 2016-09-14 DIAGNOSIS — R0602 Shortness of breath: Secondary | ICD-10-CM

## 2016-09-14 DIAGNOSIS — T380X5A Adverse effect of glucocorticoids and synthetic analogues, initial encounter: Secondary | ICD-10-CM | POA: Diagnosis not present

## 2016-09-14 DIAGNOSIS — J45909 Unspecified asthma, uncomplicated: Secondary | ICD-10-CM | POA: Diagnosis not present

## 2016-09-14 DIAGNOSIS — N179 Acute kidney failure, unspecified: Secondary | ICD-10-CM | POA: Diagnosis not present

## 2016-09-14 DIAGNOSIS — N189 Chronic kidney disease, unspecified: Secondary | ICD-10-CM | POA: Diagnosis not present

## 2016-09-14 DIAGNOSIS — J188 Other pneumonia, unspecified organism: Secondary | ICD-10-CM | POA: Diagnosis not present

## 2016-09-14 DIAGNOSIS — F410 Panic disorder [episodic paroxysmal anxiety] without agoraphobia: Secondary | ICD-10-CM | POA: Diagnosis not present

## 2016-09-14 DIAGNOSIS — I5033 Acute on chronic diastolic (congestive) heart failure: Secondary | ICD-10-CM | POA: Diagnosis not present

## 2016-09-14 DIAGNOSIS — R600 Localized edema: Secondary | ICD-10-CM | POA: Diagnosis not present

## 2016-09-14 DIAGNOSIS — J9601 Acute respiratory failure with hypoxia: Secondary | ICD-10-CM | POA: Diagnosis not present

## 2016-09-14 DIAGNOSIS — Z7951 Long term (current) use of inhaled steroids: Secondary | ICD-10-CM | POA: Diagnosis not present

## 2016-09-14 DIAGNOSIS — I5043 Acute on chronic combined systolic (congestive) and diastolic (congestive) heart failure: Secondary | ICD-10-CM | POA: Diagnosis not present

## 2016-09-14 DIAGNOSIS — Z79899 Other long term (current) drug therapy: Secondary | ICD-10-CM | POA: Insufficient documentation

## 2016-09-14 DIAGNOSIS — K219 Gastro-esophageal reflux disease without esophagitis: Secondary | ICD-10-CM | POA: Diagnosis not present

## 2016-09-14 DIAGNOSIS — J440 Chronic obstructive pulmonary disease with acute lower respiratory infection: Secondary | ICD-10-CM | POA: Diagnosis not present

## 2016-09-14 DIAGNOSIS — Z7983 Long term (current) use of bisphosphonates: Secondary | ICD-10-CM | POA: Diagnosis not present

## 2016-09-14 DIAGNOSIS — K589 Irritable bowel syndrome without diarrhea: Secondary | ICD-10-CM | POA: Diagnosis not present

## 2016-09-14 DIAGNOSIS — I2510 Atherosclerotic heart disease of native coronary artery without angina pectoris: Secondary | ICD-10-CM | POA: Diagnosis not present

## 2016-09-14 DIAGNOSIS — J4551 Severe persistent asthma with (acute) exacerbation: Secondary | ICD-10-CM

## 2016-09-14 DIAGNOSIS — E872 Acidosis: Secondary | ICD-10-CM | POA: Diagnosis not present

## 2016-09-14 DIAGNOSIS — F332 Major depressive disorder, recurrent severe without psychotic features: Secondary | ICD-10-CM | POA: Diagnosis not present

## 2016-09-14 DIAGNOSIS — I130 Hypertensive heart and chronic kidney disease with heart failure and stage 1 through stage 4 chronic kidney disease, or unspecified chronic kidney disease: Secondary | ICD-10-CM | POA: Diagnosis not present

## 2016-09-14 DIAGNOSIS — F419 Anxiety disorder, unspecified: Secondary | ICD-10-CM | POA: Diagnosis not present

## 2016-09-14 DIAGNOSIS — Z6841 Body Mass Index (BMI) 40.0 and over, adult: Secondary | ICD-10-CM | POA: Diagnosis not present

## 2016-09-14 HISTORY — DX: Pure hypercholesterolemia, unspecified: E78.00

## 2016-09-14 HISTORY — DX: Gastro-esophageal reflux disease without esophagitis: K21.9

## 2016-09-14 HISTORY — DX: Heart failure, unspecified: I50.9

## 2016-09-14 LAB — CBC WITH DIFFERENTIAL/PLATELET
BASOS ABS: 0 10*3/uL (ref 0.0–0.1)
BASOS PCT: 0 %
EOS ABS: 0 10*3/uL (ref 0.0–0.7)
Eosinophils Relative: 0 %
HEMATOCRIT: 35.1 % — AB (ref 36.0–46.0)
Hemoglobin: 11.2 g/dL — ABNORMAL LOW (ref 12.0–15.0)
Lymphocytes Relative: 7 %
Lymphs Abs: 0.7 10*3/uL (ref 0.7–4.0)
MCH: 28.9 pg (ref 26.0–34.0)
MCHC: 31.9 g/dL (ref 30.0–36.0)
MCV: 90.7 fL (ref 78.0–100.0)
MONO ABS: 0.4 10*3/uL (ref 0.1–1.0)
Monocytes Relative: 5 %
NEUTROS ABS: 8.6 10*3/uL — AB (ref 1.7–7.7)
Neutrophils Relative %: 88 %
PLATELETS: 260 10*3/uL (ref 150–400)
RBC: 3.87 MIL/uL (ref 3.87–5.11)
RDW: 16 % — AB (ref 11.5–15.5)
WBC: 9.8 10*3/uL (ref 4.0–10.5)

## 2016-09-14 LAB — COMPREHENSIVE METABOLIC PANEL
ALBUMIN: 3.3 g/dL — AB (ref 3.5–5.0)
ALT: 19 U/L (ref 0–35)
ALT: 20 U/L (ref 14–54)
ANION GAP: 8 (ref 5–15)
AST: 18 U/L (ref 0–37)
AST: 23 U/L (ref 15–41)
Albumin: 3.7 g/dL (ref 3.5–5.2)
Alkaline Phosphatase: 80 U/L (ref 39–117)
Alkaline Phosphatase: 75 U/L (ref 38–126)
BUN: 19 mg/dL (ref 6–23)
BUN: 19 mg/dL (ref 6–20)
CALCIUM: 9.2 mg/dL (ref 8.4–10.5)
CHLORIDE: 111 mmol/L (ref 101–111)
CO2: 18 meq/L — AB (ref 19–32)
CO2: 18 mmol/L — AB (ref 22–32)
Calcium: 9.1 mg/dL (ref 8.9–10.3)
Chloride: 109 mEq/L (ref 96–112)
Creatinine, Ser: 0.97 mg/dL (ref 0.40–1.20)
Creatinine, Ser: 1 mg/dL (ref 0.44–1.00)
GFR calc Af Amer: >60 mL/min (ref >60)
GFR calc non Af Amer: 55 mL/min — ABNORMAL LOW (ref >60)
GFR: 59.79 mL/min — AB (ref >60.00)
GLUCOSE: 161 mg/dL — AB (ref 70–99)
GLUCOSE: 153 mg/dL — AB (ref 65–99)
POTASSIUM: 4 meq/L (ref 3.5–5.1)
POTASSIUM: 3.9 mmol/L (ref 3.5–5.1)
SODIUM: 137 mmol/L (ref 135–145)
Sodium: 138 mEq/L (ref 135–145)
Total Bilirubin: 0.4 mg/dL (ref 0.2–1.2)
Total Bilirubin: 0.4 mg/dL (ref 0.3–1.2)
Total Protein: 6.6 g/dL (ref 6.0–8.3)
Total Protein: 6.8 g/dL (ref 6.5–8.1)

## 2016-09-14 LAB — TROPONIN I: Troponin I: <0.03 ng/mL (ref <0.03)

## 2016-09-14 LAB — I-STAT VENOUS BLOOD GAS, ED
Acid-base deficit: 6 mmol/L — ABNORMAL HIGH (ref 0.0–2.0)
BICARBONATE: 17.2 mmol/L — AB (ref 20.0–28.0)
O2 Saturation: 64 %
PCO2 VEN: 26.4 mmHg — AB (ref 44.0–60.0)
PH VEN: 7.421 (ref 7.250–7.430)
Patient temperature: 98.1
TCO2: 18 mmol/L (ref 0–100)
pO2, Ven: 31 mmHg — CL (ref 32.0–45.0)

## 2016-09-14 LAB — VITAMIN D 25 HYDROXY (VIT D DEFICIENCY, FRACTURES): VITD: 18.18 ng/mL — AB (ref 30.00–100.00)

## 2016-09-14 LAB — BRAIN NATRIURETIC PEPTIDE: B Natriuretic Peptide: 781.7 pg/mL — ABNORMAL HIGH (ref 0.0–100.0)

## 2016-09-14 MED ORDER — ACETAMINOPHEN 325 MG PO TABS
650.0000 mg | ORAL_TABLET | Freq: Once | ORAL | Status: AC
  Administered 2016-09-14: 650 mg via ORAL
  Filled 2016-09-14: qty 2

## 2016-09-14 MED ORDER — ALBUTEROL SULFATE (2.5 MG/3ML) 0.083% IN NEBU
2.5000 mg | INHALATION_SOLUTION | Freq: Once | RESPIRATORY_TRACT | Status: AC
  Administered 2016-09-14: 2.5 mg via RESPIRATORY_TRACT

## 2016-09-14 MED ORDER — VITAMIN D (ERGOCALCIFEROL) 1.25 MG (50000 UNIT) PO CAPS
50000.0000 [IU] | ORAL_CAPSULE | ORAL | 0 refills | Status: AC
Start: 2016-09-14 — End: ?

## 2016-09-14 MED ORDER — FUROSEMIDE 10 MG/ML IJ SOLN
40.0000 mg | Freq: Once | INTRAMUSCULAR | Status: AC
  Administered 2016-09-14: 40 mg via INTRAVENOUS
  Filled 2016-09-14: qty 4

## 2016-09-14 MED ORDER — ALBUTEROL (5 MG/ML) CONTINUOUS INHALATION SOLN
10.0000 mg/h | INHALATION_SOLUTION | Freq: Once | RESPIRATORY_TRACT | Status: AC
  Administered 2016-09-14: 10 mg/h via RESPIRATORY_TRACT

## 2016-09-14 MED ORDER — MAGNESIUM SULFATE 2 GM/50ML IV SOLN
2.0000 g | Freq: Once | INTRAVENOUS | Status: AC
  Administered 2016-09-14: 2 g via INTRAVENOUS
  Filled 2016-09-14: qty 50

## 2016-09-14 NOTE — Telephone Encounter (Signed)
Future vit d level placed.

## 2016-09-14 NOTE — Telephone Encounter (Signed)
[facility] Day - Client TELEPHONE ADVICE RECORD [facility] Patient Name: Stephanie Mckenzie DOB: 1941-04-13 Initial Comment Caller states she's having trouble breathing. Nurse Assessment Nurse: Dimas Chyle, RN, Dellis Filbert Date/Time Eilene Ghazi Time): 09/14/2016 9:52:29 AM Confirm and document reason for call. If symptomatic, describe symptoms. You must click the next button to save text entered. ---Caller states she's having trouble breathing. Seen in office yesterday and scheduled for chest x-ray. Having wheezing. Has the patient traveled out of the country within the last 30 days? ---No Does the patient have any new or worsening symptoms? ---Yes Will a triage be completed? ---Yes Related visit to physician within the last 2 weeks? ---Yes Does the PT have any chronic conditions? (i.e. diabetes, asthma, etc.) ---Yes List chronic conditions. ---HTN, diabetes type 2 Is this a behavioral health or substance abuse call? ---No Guidelines Guideline Title Affirmed Question Affirmed Notes Breathing Difficulty [1] MODERATE difficulty breathing (e.g., speaks in phrases, SOB even at rest, pulse 100-120) AND [2] NEW-onset or WORSE than normal Final Disposition User Go to ED Now Dimas Chyle, RN, Dellis Filbert Comments Caller refused ED outcome and said that she would just wait to be seen today for previously scheduled chest x-ray. Caller was prescribed albuterol inhaler and prednisone in office yesterday. She had not been using the inhaler as prescribed overnight. Notified office of caller refusing ED outcome. Referrals GO TO FACILITY REFUSED Disagree/Comply: Comply

## 2016-09-14 NOTE — ED Notes (Signed)
Patient requesting BiPAP off to take a break, patient back on 6 l/m Muniz RR 29, HR 98, SpO2 91%.

## 2016-09-14 NOTE — Telephone Encounter (Signed)
Pt called in stating that she is having a time breathing. I suggested to the pt to go to the ER. Pt says that she doesn't have transportation. She says that she will have a friend coming in about a hour. I suggested that the pt call EMS. She says that she doesn't want to. I transferred call to [facility].

## 2016-09-14 NOTE — Progress Notes (Signed)
Pt presented to clinic today for lab work w/ c/o increased SOB since appointment yesterday. Pt is markedly short of breath w/ increased WOB and hacking cough on assessment. Unable to speak in complete sentences, short phrases only. She appears pale.   Initial VS are as follows:  BP: 116/49 O2: 84 P: 95 T: 98.1 (oral) RR: 24  She started oral pred taper as directed yesterday. She used her albuterol inhaler once this morning w/ no relief of symptoms. Discussed w/ Elyn Aquas, PA-C in PCP absence. VO received and verified for albuterol neb x1. Post-neb sat was 84, w/ drop to 79 while talking. Pt assessed by Einar Pheasant, and given instructions to go to ED. Escorted pt and her driver (who is the employee of pt's son) to ED via wheelchair and taken for triage by EMT immediately. Verbal report called to EDP by Einar Pheasant while I was transporting pt.   Dorrene German, RN

## 2016-09-14 NOTE — Telephone Encounter (Signed)
Pt presented to clinic for labs w/ increased SOB at rest since appt yesterday. Pt was ultimately escorted to ED per Elyn Aquas, PA-C. See office note for full documentation.

## 2016-09-14 NOTE — Progress Notes (Signed)
RN note reviewed. Plan is as stated.   Patient seen yesterday in office by another provider. Felt there was some mile reactive airway secondary to mold exposure. Patient was started on steroid taper and given albuterol. Endorses increased dyspnea since that time. On initial examination with me in the lab O2 at 79, respirations > 24 per minute. Pulses stable. Lungs with some mild congestion but without audible wheeze. No asymmetric lower extremity size or swelling. Patient denies hsitory of heart failure but is followed by Cardiology and recently started on Lasix. X-ray ordered by provider at yesterday's visit reveals sign of mild CHF.   Question acute on chronic exacerbation of CHF versus RAD versus more concerning things such as PE.  Spoke with ER physician and patient was sent directly to ER in wheelchair with RN supervision.

## 2016-09-14 NOTE — ED Provider Notes (Addendum)
[place] DEPT [facility] Provider Note   CSN: [id number] Arrival date & time: 09/14/16  1242     History   Chief Complaint Chief Complaint  Patient presents with  . Shortness of Breath    HPI Stephanie Mckenzie is a 75 y.o. female.  75 year old female with a history of CHF, hypertension presenting today with shortness of breath from the clinic. Patient states her house is currently been infested with mold and she went to a hotel last night because of the breathing issues. She states that she is allergic to mold she's had her doctor yesterday where she was given an inhaler and steroids which she did not use until this morning however her coughing and shortness of breath has worsened throughout the night. When she arrived in clinic her oxygen saturation was 79% and did not improve after breathing treatment. Patient had an x-ray done in the clinic that showed fluid overload. She states that for the last several months she's had worsening swelling in her extremities but unclear why. She is currently taking torsemide and Lasix without improvement in her symptoms. She denies any chest pain or leg pain at this time.   The history is provided by the patient.  Shortness of Breath  This is a new problem. The average episode lasts 2 days. The problem occurs continuously.The current episode started 2 days ago. The problem has been gradually worsening. Associated symptoms include cough, sputum production, wheezing and leg swelling. Pertinent negatives include no fever, no chest pain, no vomiting and no abdominal pain. Precipitated by: Patient states her house has been infested with mold and she recently moved out yesterday into an apartment while they are cleaning it out. She has tried inhaled steroids and oral steroids for the symptoms. The treatment provided no relief. Associated medical issues include heart failure.    Past Medical History:  Diagnosis Date  . CHF (congestive heart failure) ([place])   .  Depression   . Edema 10/09/2015  . Hypertension   . Migraine     Patient Active Problem List   Diagnosis Date Noted  . OSA (obstructive sleep apnea) 08/30/2016  . Diastolic dysfunction 99991111  . Synovitis of knee 06/05/2016  . Hyperglycemia 11/17/2015  . Disorder of uterus Nov 04, 202016  . Degenerative joint disease involving multiple joints Nov 04, 202016  . Muscle ache Nov 04, 202016  . Bulge of lumbar disc without myelopathy Nov 04, 202016  . Jaw pain Nov 04, 202016  . IBS (irritable bowel syndrome) Nov 04, 202016  . Abnormal cardiovascular function study Nov 04, 202016  . Adjustment disorder with depressed mood Nov 04, 202016  . Degenerative arthritis of spine Nov 04, 202016  . Excessive falling Nov 04, 202016  . Edema 10/09/2015  . Effusion, pericardium 09/07/2015  . Acute post-traumatic headache, not intractable 09/06/2015  . Memory loss 07/25/2015  . Mental confusion 06/07/2015  . Avitaminosis D 05/16/2015  . B12 deficiency 05/16/2015  . Arteriosclerosis of coronary artery 05/16/2015  . Fibrositis 05/16/2015  . Fibromyalgia 05/16/2015  . MDD (major depressive disorder) 05/13/2015  . Affective disorder, major 05/13/2015  . Depression   . Suicidal ideation   . Chronic kidney disease 01/19/2015  . Bursitis, trochanteric 01/18/2015  . H/O gastrointestinal disease 09/30/2014  . Mass of pelvis 09/24/2014  . CAD in native artery 09/24/2014  . Cyst of ovary 08/26/2014  . Colitis 08/11/2014  . Colitis gravis ([place]) 08/05/2014  . Ulcerative colitis ([place]) 08/05/2014  . 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 Anxiety 06/09/2013  . Pain in joint, ankle and foot 06/09/2013  . GERD (gastroesophageal reflux disease)  06/09/2013  . Insomnia 06/09/2013  . Constipation 06/09/2013  . Depression with anxiety 05/22/2013  . Bimalleolar fracture 05/07/2013  . Essential hypertension, benign 05/07/2013  . Insomnia, unspecified 05/07/2013  . Pain in limb 05/07/2013  . OP (osteoporosis) 05/02/2013  . Difficulty hearing  05/02/2013  . HLD (hyperlipidemia) 05/02/2013  . HPTH (hyperparathyroidism) ([place]) 05/02/2013  . Malaise and fatigue 05/02/2013  . Clinical depression 05/02/2013  . Moderate episode of recurrent major depressive disorder ([place]) 05/02/2013  . Hyperparathyroidism ([place]) 05/02/2013    Past Surgical History:  Procedure Laterality Date  . ABDOMINAL HYSTERECTOMY    . APPENDECTOMY    . CESAREAN SECTION     x 2  . CORONARY ANGIOPLASTY    . LAMINECTOMY    . PARATHYROIDECTOMY      OB History    No data available       Home Medications    Prior to Admission medications   Medication Sig Start Date End Date Taking? Authorizing Provider  albuterol (PROAIR HFA) 108 (90 Base) MCG/ACT inhaler Inhale 2 puffs into the lungs every 6 (six) hours as needed for wheezing or shortness of breath. 09/13/16   Percell Miller Saguier, PA-C  alendronate (FOSAMAX) 70 MG tablet Take 1 tablet (70 mg total) by mouth every 7 (seven) days. Take with a full glass of water on an empty stomach. 04/05/16   Rosalita Chessman Chase, DO  aspirin 81 MG tablet Take 81 mg by mouth daily.    Historical Provider, MD  atorvastatin (LIPITOR) 40 MG tablet TAKE ONE TABLET BY MOUTH ONCE DAILY 06/25/16   Alferd Apa Lowne Chase, DO  azithromycin (ZITHROMAX) 250 MG tablet Take 2 tablets by mouth on day 1, followed by 1 tablet by mouth daily for 4 days. 09/13/16   Edward Saguier, PA-C  benzonatate (TESSALON) 100 MG capsule Take 1 capsule (100 mg total) by mouth 3 (three) times daily as needed for cough. 09/13/16   Edward Saguier, PA-C  CARTIA XT 180 MG 24 hr capsule TAKE ONE CAPSULE BY MOUTH ONCE DAILY 06/19/16   Alferd Apa Lowne Chase, DO  FLUoxetine (PROZAC) 40 MG capsule Take 1 capsule by mouth daily. 11/03/15   Historical Provider, MD  fluticasone (FLONASE) 50 MCG/ACT nasal spray Place 2 sprays into both nostrils daily. 02/28/16   Rosalita Chessman Chase, DO  folic acid (FOLVITE) 1 MG tablet TAKE ONE TABLET BY MOUTH ONCE DAILY 08/02/16   Rosalita Chessman Chase,  DO  furosemide (LASIX) 20 MG tablet TAKE ONE TABLET BY MOUTH ONCE DAILY AS NEEDED FOR  EDEMA 07/12/16   Rosalita Chessman Chase, DO  glucose blood test strip Check blood sugar no more than three times daily. 03/15/16   Rosalita Chessman Chase, DO  metoprolol succinate (TOPROL-XL) 50 MG 24 hr tablet TAKE ONE TABLET BY MOUTH TWICE DAILY 09/02/16   Rosalita Chessman Chase, DO  mirtazapine (REMERON) 30 MG tablet Take 30 mg by mouth at bedtime.    Historical Provider, MD  NONFORMULARY OR COMPOUNDED ITEM Compression stockings 20-30 mg hg #1 pair 07/12/16   Rosalita Chessman Chase, DO  nystatin (MYCOSTATIN/NYSTOP) 100000 UNIT/GM POWD Apply up to qid 02/28/16   Yvonne R Lowne Chase, DO  omeprazole (PRILOSEC) 40 MG capsule Take 40 mg by mouth 2 (two) times daily.    Historical Provider, MD  ONE TOUCH LANCETS MISC Check blood sugar no more than three times daily 03/15/16   Rosalita Chessman Chase, DO  oxybutynin (DITROPAN XL) 15 MG  24 hr tablet TAKE ONE TABLET BY MOUTH ONCE DAILY 08/02/16   Rosalita Chessman Chase, DO  potassium chloride (K-DUR) 10 MEQ tablet TAKE ONE TABLET BY MOUTH THREE TIMES DAILY 05/01/16   Rosalita Chessman Chase, DO  predniSONE (DELTASONE) 10 MG tablet 5 tab po day 1, 4 tab po day 2, 3 tab po day 3, 2 tab po day 4, 1 tab po day 5. 09/13/16   Edward Saguier, PA-C  torsemide (DEMADEX) 20 MG tablet TAKE TWO TABLETS BY MOUTH ONCE DAILY 07/26/16   Rosalita Chessman Chase, DO  zolpidem (AMBIEN) 5 MG tablet Take 5 mg by mouth once.    Historical Provider, MD    Family History Family History  Problem Relation Age of Onset  . Diabetes Mother   . Hypertension Mother   . Thyroid disease Mother     Social History Social History  Substance Use Topics  . Smoking status: Former Smoker    Packs/day: 1.00    Years: 30.00    Types: Cigarettes    Quit date: 08/31/2015  . Smokeless tobacco: Never Used  . Alcohol use 0.0 oz/week     Comment: occasional     Allergies   Dilaudid [hydromorphone]; Lorazepam; Acyclovir and  related; Amlodipine besylate; Morphine and related; and Buprenorphine hcl   Review of Systems Review of Systems  Constitutional: Negative for fever.  Respiratory: Positive for cough, sputum production, shortness of breath and wheezing.   Cardiovascular: Positive for leg swelling. Negative for chest pain.  Gastrointestinal: Negative for abdominal pain and vomiting.  All other systems reviewed and are negative.    Physical Exam Updated Vital Signs BP 138/60   Pulse 100   Resp 23   SpO2 95%   Physical Exam  Constitutional: She is oriented to person, place, and time. She appears well-developed and well-nourished. No distress.  HENT:  Head: Normocephalic and atraumatic.  Mouth/Throat: Oropharynx is clear and moist.  Eyes: Conjunctivae and EOM are normal. Pupils are equal, round, and reactive to light.  Neck: Normal range of motion. Neck supple.  Cardiovascular: Regular rhythm and intact distal pulses.  Tachycardia present.   No murmur heard. Pulmonary/Chest: Accessory muscle usage present. Tachypnea noted. No respiratory distress. She has decreased breath sounds. She has wheezes. She has no rales.  Abdominal: Soft. She exhibits no distension. There is no tenderness. There is no rebound and no guarding.  Musculoskeletal: Normal range of motion. She exhibits edema. She exhibits no tenderness.  1+ pitting edema to the midshin bilaterally  Neurological: She is alert and oriented to person, place, and time.  Skin: Skin is warm and dry. No rash noted. No erythema.  Psychiatric: She has a normal mood and affect. Her behavior is normal.  Nursing note and vitals reviewed.    ED Treatments / Results  Labs (all labs ordered are listed, but only abnormal results are displayed) Labs Reviewed  CBC WITH DIFFERENTIAL/PLATELET - Abnormal; Notable for the following:       Result Value   Hemoglobin 11.2 (*)    HCT 35.1 (*)    RDW 16.0 (*)    Neutro Abs 8.6 (*)    All other components within  normal limits  COMPREHENSIVE METABOLIC PANEL - Abnormal; Notable for the following:    CO2 18 (*)    Glucose, Bld 153 (*)    Albumin 3.3 (*)    GFR calc non Af Amer 55 (*)    All other components within normal limits  BRAIN  NATRIURETIC PEPTIDE - Abnormal; Notable for the following:    B Natriuretic Peptide 781.7 (*)    All other components within normal limits  I-STAT VENOUS BLOOD GAS, ED - Abnormal; Notable for the following:    pCO2, Ven 26.4 (*)    pO2, Ven 31.0 (*)    Bicarbonate 17.2 (*)    Acid-base deficit 6.0 (*)    All other components within normal limits  TROPONIN I  TROPONIN I  TROPONIN I  I-STAT ARTERIAL BLOOD GAS, ED    EKG  EKG Interpretation  Date/Time:  Friday September 14 2016 12:54:23 EDT Ventricular Rate:  94 PR Interval:    QRS Duration: 89 QT Interval:  358 QTC Calculation: 448 R Axis:   48 Text Interpretation:  Sinus rhythm Ventricular premature complex Aberrant complex Prominent P waves, nondiagnostic Abnormal R-wave progression, early transition new Minimal ST depression, diffuse leads Confirmed by Maryan Rued  MD, [place] Mefferd ([id number]) on 09/14/2016 1:47:00 PM       Radiology Dg Chest 2 View  Result Date: 09/14/2016 CLINICAL DATA:  Cough and shortness of breath for 1 week EXAM: CHEST  2 VIEW COMPARISON:  08/09/2016 FINDINGS: Cardiac shadow is within normal limits. Aortic calcifications are again seen. Mild vascular congestion interstitial edema is noted superimposed over but more chronic interstitial changes. No focal confluent infiltrate is seen. No sizable effusion is noted. IMPRESSION: Mild CHF Electronically Signed   By: Inez Catalina M.D.   On: 09/14/2016 12:02    Procedures Procedures (including critical care time)  Medications Ordered in ED Medications  furosemide (LASIX) injection 40 mg (not administered)  albuterol (PROVENTIL,VENTOLIN) solution continuous neb (10 mg/hr Nebulization Given 09/14/16 1305)  magnesium sulfate IVPB 2 g 50 mL (2 g  Intravenous New Bag/Given 09/14/16 1309)     Initial Impression / Assessment and Plan / ED Course  I have reviewed the triage vital signs and the nursing notes.  Pertinent labs & imaging results that were available during my care of the patient were reviewed by me and considered in my medical decision making (see chart for details).  Clinical Course    Patient is a 75 year old female presenting from the clinic upstairs today with respiratory distress. Patient has a history of CHF and has had ongoing treatment for distal edema over the last few months. She is currently taking torsemide and Lasix but has not found the cause of her swelling. However she does note over the last few weeks her house has been infested with mold and she recently moved out yesterday to a hotel room because they are getting rid of the mold in her home. She does note that she is allergic to mold. She saw her PCP yesterday because of shortness of breath and at that time was given an inhaler and steroids. She did not use those items told this morning but states overnight she had severe shortness of breath and coughing. When she arrived in the clinic today her oxygen saturation was 79%. X-ray shows mild CHF but no other acute findings. On exam patient's tachypnea with decreased breath sounds and scant wheezes. She is only able to talk in one word sentences. No history of smoking or COPD. No stridor or oropharyngeal or tongue edema.  Concern for potential CHF exacerbation in addition to reactive airways from recent mold exposure. Lower suspicion for PE or pneumonia. CBC within normal limits and CMP is unchanged. Patient has a negative troponin but a BNP of almost 800. Patient had an echo done 2  months ago which showed an EF between 65 and 70% After an hour-long albuterol and magnesium some improvement in shortness of breath however oxygen saturation still around 91-92% on 6 L. Patient started on BiPAP and given Lasix. Feel she will  need admission for further medical management.  CRITICAL CARE Performed by: Blanchie Dessert Total critical care time: 45 minutes Critical care time was exclusive of separately billable procedures and treating other patients. Critical care was necessary to treat or prevent imminent or life-threatening deterioration. Critical care was time spent personally by me on the following activities: development of treatment plan with patient and/or surrogate as well as nursing, discussions with consultants, evaluation of patient's response to treatment, examination of patient, obtaining history from patient or surrogate, ordering and performing treatments and interventions, ordering and review of laboratory studies, ordering and review of radiographic studies, pulse oximetry and re-evaluation of patient's condition.  Final Clinical Impressions(s) / ED Diagnoses   Final diagnoses:  Hypoxia  Acute on chronic congestive heart failure, unspecified congestive heart failure type (HCC)  Severe persistent reactive airway disease with acute exacerbation    New Prescriptions New Prescriptions   No medications on file     Blanchie Dessert, MD 09/14/16 [place], MD 09/14/16 1528

## 2016-09-14 NOTE — ED Notes (Signed)
Pt c/o HA, MD made aware, tylenol ordered.

## 2016-09-14 NOTE — ED Notes (Signed)
HP-! Is transferring patient to [facility] --room 734

## 2016-09-14 NOTE — ED Triage Notes (Addendum)
Pt sent from upstairs for low o2 sats, pt c/o SOB since yesterday. Pt states she was living in a house with mold and moved to a hotel yesterday. MD and RT at bedside. Pt states she was seen by PCP yesterday and given albuterol and steroids.

## 2016-09-15 DIAGNOSIS — I340 Nonrheumatic mitral (valve) insufficiency: Secondary | ICD-10-CM | POA: Diagnosis not present

## 2016-09-15 DIAGNOSIS — J441 Chronic obstructive pulmonary disease with (acute) exacerbation: Secondary | ICD-10-CM | POA: Diagnosis not present

## 2016-09-15 DIAGNOSIS — R05 Cough: Secondary | ICD-10-CM | POA: Diagnosis not present

## 2016-09-15 DIAGNOSIS — R0602 Shortness of breath: Secondary | ICD-10-CM | POA: Diagnosis not present

## 2016-09-16 DIAGNOSIS — J9601 Acute respiratory failure with hypoxia: Secondary | ICD-10-CM | POA: Diagnosis not present

## 2016-09-16 DIAGNOSIS — E872 Acidosis: Secondary | ICD-10-CM | POA: Diagnosis not present

## 2016-09-16 DIAGNOSIS — J441 Chronic obstructive pulmonary disease with (acute) exacerbation: Secondary | ICD-10-CM | POA: Diagnosis not present

## 2016-09-16 DIAGNOSIS — F332 Major depressive disorder, recurrent severe without psychotic features: Secondary | ICD-10-CM | POA: Diagnosis not present

## 2016-09-16 DIAGNOSIS — J168 Pneumonia due to other specified infectious organisms: Secondary | ICD-10-CM | POA: Diagnosis not present

## 2016-09-16 DIAGNOSIS — F411 Generalized anxiety disorder: Secondary | ICD-10-CM | POA: Diagnosis not present

## 2016-09-16 DIAGNOSIS — J984 Other disorders of lung: Secondary | ICD-10-CM | POA: Diagnosis not present

## 2016-09-17 DIAGNOSIS — J9601 Acute respiratory failure with hypoxia: Secondary | ICD-10-CM | POA: Diagnosis not present

## 2016-09-17 DIAGNOSIS — R0902 Hypoxemia: Secondary | ICD-10-CM | POA: Diagnosis not present

## 2016-09-18 DIAGNOSIS — N189 Chronic kidney disease, unspecified: Secondary | ICD-10-CM | POA: Diagnosis not present

## 2016-09-18 DIAGNOSIS — E872 Acidosis: Secondary | ICD-10-CM | POA: Diagnosis not present

## 2016-09-18 DIAGNOSIS — E119 Type 2 diabetes mellitus without complications: Secondary | ICD-10-CM | POA: Diagnosis not present

## 2016-09-18 DIAGNOSIS — J9601 Acute respiratory failure with hypoxia: Secondary | ICD-10-CM | POA: Diagnosis not present

## 2016-09-18 DIAGNOSIS — K589 Irritable bowel syndrome without diarrhea: Secondary | ICD-10-CM | POA: Diagnosis not present

## 2016-09-18 DIAGNOSIS — G4733 Obstructive sleep apnea (adult) (pediatric): Secondary | ICD-10-CM | POA: Diagnosis not present

## 2016-09-18 DIAGNOSIS — J9691 Respiratory failure, unspecified with hypoxia: Secondary | ICD-10-CM | POA: Diagnosis not present

## 2016-09-18 DIAGNOSIS — I509 Heart failure, unspecified: Secondary | ICD-10-CM | POA: Diagnosis not present

## 2016-09-18 DIAGNOSIS — J449 Chronic obstructive pulmonary disease, unspecified: Secondary | ICD-10-CM | POA: Diagnosis not present

## 2016-09-19 DIAGNOSIS — F419 Anxiety disorder, unspecified: Secondary | ICD-10-CM | POA: Diagnosis not present

## 2016-09-19 DIAGNOSIS — I509 Heart failure, unspecified: Secondary | ICD-10-CM | POA: Diagnosis not present

## 2016-09-19 DIAGNOSIS — K589 Irritable bowel syndrome without diarrhea: Secondary | ICD-10-CM | POA: Diagnosis not present

## 2016-09-19 DIAGNOSIS — J449 Chronic obstructive pulmonary disease, unspecified: Secondary | ICD-10-CM | POA: Diagnosis not present

## 2016-09-19 DIAGNOSIS — R042 Hemoptysis: Secondary | ICD-10-CM | POA: Diagnosis not present

## 2016-09-19 DIAGNOSIS — R601 Generalized edema: Secondary | ICD-10-CM | POA: Diagnosis not present

## 2016-09-19 DIAGNOSIS — E663 Overweight: Secondary | ICD-10-CM | POA: Diagnosis not present

## 2016-09-19 DIAGNOSIS — F319 Bipolar disorder, unspecified: Secondary | ICD-10-CM | POA: Diagnosis not present

## 2016-09-19 DIAGNOSIS — K219 Gastro-esophageal reflux disease without esophagitis: Secondary | ICD-10-CM | POA: Diagnosis not present

## 2016-09-19 DIAGNOSIS — J441 Chronic obstructive pulmonary disease with (acute) exacerbation: Secondary | ICD-10-CM | POA: Diagnosis not present

## 2016-09-19 DIAGNOSIS — R0902 Hypoxemia: Secondary | ICD-10-CM | POA: Diagnosis not present

## 2016-09-19 DIAGNOSIS — I2699 Other pulmonary embolism without acute cor pulmonale: Secondary | ICD-10-CM | POA: Diagnosis not present

## 2016-09-19 DIAGNOSIS — J45909 Unspecified asthma, uncomplicated: Secondary | ICD-10-CM | POA: Diagnosis not present

## 2016-09-19 DIAGNOSIS — I10 Essential (primary) hypertension: Secondary | ICD-10-CM | POA: Diagnosis not present

## 2016-09-19 DIAGNOSIS — J9601 Acute respiratory failure with hypoxia: Secondary | ICD-10-CM | POA: Diagnosis not present

## 2016-09-20 DIAGNOSIS — J441 Chronic obstructive pulmonary disease with (acute) exacerbation: Secondary | ICD-10-CM | POA: Diagnosis not present

## 2016-09-20 DIAGNOSIS — J9601 Acute respiratory failure with hypoxia: Secondary | ICD-10-CM | POA: Diagnosis not present

## 2016-09-20 DIAGNOSIS — I509 Heart failure, unspecified: Secondary | ICD-10-CM | POA: Diagnosis not present

## 2016-09-21 ENCOUNTER — Ambulatory Visit: Payer: Self-pay | Admitting: Family Medicine

## 2016-09-21 DIAGNOSIS — J9691 Respiratory failure, unspecified with hypoxia: Secondary | ICD-10-CM | POA: Diagnosis not present

## 2016-09-21 DIAGNOSIS — J9601 Acute respiratory failure with hypoxia: Secondary | ICD-10-CM | POA: Diagnosis not present

## 2016-09-21 DIAGNOSIS — I509 Heart failure, unspecified: Secondary | ICD-10-CM | POA: Diagnosis not present

## 2016-09-21 DIAGNOSIS — J449 Chronic obstructive pulmonary disease, unspecified: Secondary | ICD-10-CM | POA: Diagnosis not present

## 2016-09-22 DIAGNOSIS — J9601 Acute respiratory failure with hypoxia: Secondary | ICD-10-CM | POA: Diagnosis not present

## 2016-09-23 DIAGNOSIS — J441 Chronic obstructive pulmonary disease with (acute) exacerbation: Secondary | ICD-10-CM | POA: Diagnosis not present

## 2016-09-23 DIAGNOSIS — J9601 Acute respiratory failure with hypoxia: Secondary | ICD-10-CM | POA: Diagnosis not present

## 2016-09-23 DIAGNOSIS — E872 Acidosis: Secondary | ICD-10-CM | POA: Diagnosis not present

## 2016-09-23 DIAGNOSIS — J679 Hypersensitivity pneumonitis due to unspecified organic dust: Secondary | ICD-10-CM | POA: Diagnosis not present

## 2016-09-23 DIAGNOSIS — N189 Chronic kidney disease, unspecified: Secondary | ICD-10-CM | POA: Diagnosis not present

## 2016-09-23 DIAGNOSIS — R0600 Dyspnea, unspecified: Secondary | ICD-10-CM | POA: Diagnosis not present

## 2016-09-23 DIAGNOSIS — E119 Type 2 diabetes mellitus without complications: Secondary | ICD-10-CM | POA: Diagnosis not present

## 2016-09-23 DIAGNOSIS — I5033 Acute on chronic diastolic (congestive) heart failure: Secondary | ICD-10-CM | POA: Diagnosis not present

## 2016-09-24 DIAGNOSIS — J9601 Acute respiratory failure with hypoxia: Secondary | ICD-10-CM | POA: Diagnosis not present

## 2016-09-24 DIAGNOSIS — K589 Irritable bowel syndrome without diarrhea: Secondary | ICD-10-CM | POA: Diagnosis not present

## 2016-09-24 DIAGNOSIS — E663 Overweight: Secondary | ICD-10-CM | POA: Diagnosis not present

## 2016-09-24 DIAGNOSIS — I10 Essential (primary) hypertension: Secondary | ICD-10-CM | POA: Diagnosis not present

## 2016-09-24 DIAGNOSIS — R601 Generalized edema: Secondary | ICD-10-CM | POA: Diagnosis not present

## 2016-09-24 DIAGNOSIS — E872 Acidosis: Secondary | ICD-10-CM | POA: Diagnosis not present

## 2016-09-24 DIAGNOSIS — I5031 Acute diastolic (congestive) heart failure: Secondary | ICD-10-CM | POA: Diagnosis not present

## 2016-09-24 DIAGNOSIS — J45909 Unspecified asthma, uncomplicated: Secondary | ICD-10-CM | POA: Diagnosis not present

## 2016-09-24 DIAGNOSIS — F419 Anxiety disorder, unspecified: Secondary | ICD-10-CM | POA: Diagnosis not present

## 2016-09-24 DIAGNOSIS — K219 Gastro-esophageal reflux disease without esophagitis: Secondary | ICD-10-CM | POA: Diagnosis not present

## 2016-09-24 DIAGNOSIS — J449 Chronic obstructive pulmonary disease, unspecified: Secondary | ICD-10-CM | POA: Diagnosis not present

## 2016-09-24 DIAGNOSIS — R042 Hemoptysis: Secondary | ICD-10-CM | POA: Diagnosis not present

## 2016-09-25 DIAGNOSIS — J679 Hypersensitivity pneumonitis due to unspecified organic dust: Secondary | ICD-10-CM | POA: Diagnosis not present

## 2016-09-25 DIAGNOSIS — J441 Chronic obstructive pulmonary disease with (acute) exacerbation: Secondary | ICD-10-CM | POA: Diagnosis not present

## 2016-09-25 DIAGNOSIS — I517 Cardiomegaly: Secondary | ICD-10-CM | POA: Diagnosis not present

## 2016-09-25 DIAGNOSIS — J9601 Acute respiratory failure with hypoxia: Secondary | ICD-10-CM | POA: Diagnosis not present

## 2016-09-26 DIAGNOSIS — J9601 Acute respiratory failure with hypoxia: Secondary | ICD-10-CM | POA: Diagnosis not present

## 2016-09-26 DIAGNOSIS — J679 Hypersensitivity pneumonitis due to unspecified organic dust: Secondary | ICD-10-CM | POA: Diagnosis not present

## 2016-09-26 DIAGNOSIS — R042 Hemoptysis: Secondary | ICD-10-CM | POA: Diagnosis not present

## 2016-09-26 DIAGNOSIS — J449 Chronic obstructive pulmonary disease, unspecified: Secondary | ICD-10-CM | POA: Diagnosis not present

## 2016-09-27 DIAGNOSIS — F418 Other specified anxiety disorders: Secondary | ICD-10-CM | POA: Diagnosis not present

## 2016-09-27 DIAGNOSIS — I509 Heart failure, unspecified: Secondary | ICD-10-CM | POA: Diagnosis not present

## 2016-09-27 DIAGNOSIS — J9620 Acute and chronic respiratory failure, unspecified whether with hypoxia or hypercapnia: Secondary | ICD-10-CM | POA: Diagnosis not present

## 2016-09-27 DIAGNOSIS — J678 Hypersensitivity pneumonitis due to other organic dusts: Secondary | ICD-10-CM | POA: Diagnosis not present

## 2016-09-27 DIAGNOSIS — J9601 Acute respiratory failure with hypoxia: Secondary | ICD-10-CM | POA: Diagnosis not present

## 2016-09-27 DIAGNOSIS — I10 Essential (primary) hypertension: Secondary | ICD-10-CM | POA: Diagnosis not present

## 2016-09-27 DIAGNOSIS — J679 Hypersensitivity pneumonitis due to unspecified organic dust: Secondary | ICD-10-CM | POA: Diagnosis not present

## 2016-09-27 DIAGNOSIS — E663 Overweight: Secondary | ICD-10-CM | POA: Diagnosis not present

## 2016-09-27 DIAGNOSIS — K589 Irritable bowel syndrome without diarrhea: Secondary | ICD-10-CM | POA: Diagnosis not present

## 2016-09-27 DIAGNOSIS — K219 Gastro-esophageal reflux disease without esophagitis: Secondary | ICD-10-CM | POA: Diagnosis not present

## 2016-09-27 DIAGNOSIS — E872 Acidosis: Secondary | ICD-10-CM | POA: Diagnosis not present

## 2016-09-27 DIAGNOSIS — J441 Chronic obstructive pulmonary disease with (acute) exacerbation: Secondary | ICD-10-CM | POA: Diagnosis not present

## 2016-09-27 DIAGNOSIS — E119 Type 2 diabetes mellitus without complications: Secondary | ICD-10-CM | POA: Diagnosis not present

## 2016-09-27 DIAGNOSIS — R601 Generalized edema: Secondary | ICD-10-CM | POA: Diagnosis not present

## 2016-09-28 DIAGNOSIS — I10 Essential (primary) hypertension: Secondary | ICD-10-CM | POA: Diagnosis not present

## 2016-09-28 DIAGNOSIS — M6281 Muscle weakness (generalized): Secondary | ICD-10-CM | POA: Diagnosis not present

## 2016-09-28 DIAGNOSIS — J189 Pneumonia, unspecified organism: Secondary | ICD-10-CM | POA: Diagnosis not present

## 2016-09-28 DIAGNOSIS — I509 Heart failure, unspecified: Secondary | ICD-10-CM | POA: Diagnosis not present

## 2016-09-28 DIAGNOSIS — J449 Chronic obstructive pulmonary disease, unspecified: Secondary | ICD-10-CM | POA: Diagnosis not present

## 2016-09-28 DIAGNOSIS — J9600 Acute respiratory failure, unspecified whether with hypoxia or hypercapnia: Secondary | ICD-10-CM | POA: Diagnosis not present

## 2016-10-04 DIAGNOSIS — F329 Major depressive disorder, single episode, unspecified: Secondary | ICD-10-CM | POA: Diagnosis not present

## 2016-10-04 DIAGNOSIS — G4700 Insomnia, unspecified: Secondary | ICD-10-CM | POA: Diagnosis not present

## 2016-10-04 DIAGNOSIS — I509 Heart failure, unspecified: Secondary | ICD-10-CM | POA: Diagnosis not present

## 2016-10-04 DIAGNOSIS — M6250 Muscle wasting and atrophy, not elsewhere classified, unspecified site: Secondary | ICD-10-CM | POA: Diagnosis not present

## 2016-10-09 DIAGNOSIS — I5030 Unspecified diastolic (congestive) heart failure: Secondary | ICD-10-CM | POA: Diagnosis not present

## 2016-10-09 DIAGNOSIS — J189 Pneumonia, unspecified organism: Secondary | ICD-10-CM | POA: Diagnosis not present

## 2016-10-09 DIAGNOSIS — M797 Fibromyalgia: Secondary | ICD-10-CM | POA: Diagnosis not present

## 2016-10-09 DIAGNOSIS — F418 Other specified anxiety disorders: Secondary | ICD-10-CM | POA: Diagnosis not present

## 2016-10-11 ENCOUNTER — Ambulatory Visit (HOSPITAL_BASED_OUTPATIENT_CLINIC_OR_DEPARTMENT_OTHER): Payer: PPO | Attending: Pulmonary Disease | Admitting: Pulmonary Disease

## 2016-10-11 VITALS — Ht 64.0 in | Wt 236.0 lb

## 2016-10-11 DIAGNOSIS — E669 Obesity, unspecified: Secondary | ICD-10-CM | POA: Insufficient documentation

## 2016-10-11 DIAGNOSIS — Z6841 Body Mass Index (BMI) 40.0 and over, adult: Secondary | ICD-10-CM | POA: Diagnosis not present

## 2016-10-11 DIAGNOSIS — I10 Essential (primary) hypertension: Secondary | ICD-10-CM | POA: Diagnosis not present

## 2016-10-11 DIAGNOSIS — R5383 Other fatigue: Secondary | ICD-10-CM | POA: Insufficient documentation

## 2016-10-11 DIAGNOSIS — G4733 Obstructive sleep apnea (adult) (pediatric): Secondary | ICD-10-CM | POA: Diagnosis not present

## 2016-10-11 DIAGNOSIS — R51 Headache: Secondary | ICD-10-CM | POA: Diagnosis not present

## 2016-10-11 DIAGNOSIS — R0683 Snoring: Secondary | ICD-10-CM | POA: Insufficient documentation

## 2016-10-11 DIAGNOSIS — E119 Type 2 diabetes mellitus without complications: Secondary | ICD-10-CM | POA: Diagnosis not present

## 2016-10-12 ENCOUNTER — Other Ambulatory Visit (HOSPITAL_BASED_OUTPATIENT_CLINIC_OR_DEPARTMENT_OTHER): Payer: Self-pay

## 2016-10-12 DIAGNOSIS — E119 Type 2 diabetes mellitus without complications: Secondary | ICD-10-CM | POA: Diagnosis not present

## 2016-10-12 DIAGNOSIS — I509 Heart failure, unspecified: Secondary | ICD-10-CM | POA: Diagnosis not present

## 2016-10-12 DIAGNOSIS — G4733 Obstructive sleep apnea (adult) (pediatric): Secondary | ICD-10-CM

## 2016-10-12 DIAGNOSIS — M6281 Muscle weakness (generalized): Secondary | ICD-10-CM | POA: Diagnosis not present

## 2016-10-12 DIAGNOSIS — G4730 Sleep apnea, unspecified: Secondary | ICD-10-CM | POA: Diagnosis not present

## 2016-10-16 ENCOUNTER — Telehealth: Payer: Self-pay | Admitting: Pulmonary Disease

## 2016-10-16 DIAGNOSIS — G4733 Obstructive sleep apnea (adult) (pediatric): Secondary | ICD-10-CM

## 2016-10-16 DIAGNOSIS — G4730 Sleep apnea, unspecified: Secondary | ICD-10-CM | POA: Diagnosis not present

## 2016-10-16 NOTE — Procedures (Signed)
Patient Name: Stephanie Mckenzie, Stephanie Mckenzie Date: 10/11/2016 Gender: Female D.O.B: 11-30-1940 Age (years): 57 Referring Provider: Kara Mead MD, ABSM Height (inches): 64 Interpreting Physician: Kara Mead MD, ABSM Weight (lbs): 236 RPSGT: Laren Everts BMI: 41 MRN: [id number] Neck Size: 16.50   CLINICAL INFORMATION Sleep Study Type: NPSG Indication for sleep study: Diabetes, Excessive Daytime Sleepiness, Fatigue, Hypertension, Morning Headaches, Obesity, OSA, Sleep walking/talking/parasomnias, Snoring, Witnessed Apneas Epworth Sleepiness Score: 6   SLEEP STUDY TECHNIQUE As per the AASM Manual for the Scoring of Sleep and Associated Events v2.3 (April 2016) with a hypopnea requiring 4% desaturations. The channels recorded and monitored were frontal, central and occipital EEG, electrooculogram (EOG), submentalis EMG (chin), nasal and oral airflow, thoracic and abdominal wall motion, anterior tibialis EMG, snore microphone, electrocardiogram, and pulse oximetry.   MEDICATIONS Medications self-administered by patient taken the night of the study : AMBIEN, REMERON, LIPITOR, MELATONIN, MUCINEX, LOPRESSOR, PRILOSEC, BENTYL, FIORICET    SLEEP ARCHITECTURE The study was initiated at 10:35:38 PM and ended at 4:40:51 AM. Sleep onset time was 10.8 minutes and the sleep efficiency was 49.8%. The total sleep time was 182.0 minutes. Stage REM latency was 126.5 minutes. The patient spent 53.85% of the night in stage N1 sleep, 45.88% in stage N2 sleep, 0.00% in stage N3 and 0.27% in REM. Alpha intrusion was absent. Supine sleep was 8.24%.    RESPIRATORY PARAMETERS The overall apnea/hypopnea index (AHI) was 44.8 per hour. There were 103 total apneas, including 102 obstructive, 0 central and 1 mixed apneas. There were 33 hypopneas and 129 RERAs. The AHI during Stage REM sleep was 0.0 per hour. AHI while supine was 132.0 per hour. The mean oxygen saturation was 95.68%. The minimum SpO2 during sleep  was 88.00%. Moderate snoring was noted during this study.   CARDIAC DATA The 2 lead EKG demonstrated sinus rhythm. The mean heart rate was 76.72 beats per minute. Other EKG findings include: None.    LEG MOVEMENT DATA The total PLMS were 0 with a resulting PLMS index of 0.00. Associated arousal with leg movement index was 0.0 .   IMPRESSIONS - Severe obstructive sleep apnea occurred during this study (AHI = 44.8/h). - No significant central sleep apnea occurred during this study (CAI = 0.0/h). - This study was performed on 2 L oxygen.The patient had minimal or no oxygen desaturation during the study (Min O2 = 88.00%).  - The patient snored with Moderate snoring volume. - No cardiac abnormalities were noted during this study. - Clinically significant periodic limb movements did not occur during sleep. No significant associated arousals.    DIAGNOSIS - Obstructive Sleep Apnea (327.23 [G47.33 ICD-10])    RECOMMENDATIONS - Therapeutic CPAP titration to determine optimal pressure required to alleviate sleep disordered breathing. - Positional therapy avoiding supine position during sleep. - Avoid alcohol, sedatives and other CNS depressants that may worsen sleep apnea and disrupt normal sleep architecture. - Sleep hygiene should be reviewed to assess factors that may improve sleep quality. - Weight management and regular exercise should be initiated or continued if appropriate.    Kara Mead MD Board Certified in [place]

## 2016-10-16 NOTE — Telephone Encounter (Signed)
Sleep study was performed on 2 L oxygen This showed that she stop breathing 45 times an hour-severe OSA  She needs CPAP titration study- please order if patient is willing

## 2016-10-17 NOTE — Telephone Encounter (Signed)
LM x 1 

## 2016-10-19 ENCOUNTER — Other Ambulatory Visit: Payer: Self-pay | Admitting: Family Medicine

## 2016-10-19 NOTE — Telephone Encounter (Signed)
Results have been explained to patient, pt expressed understanding. Nothing further needed.  

## 2016-10-23 ENCOUNTER — Telehealth: Payer: Self-pay | Admitting: Family Medicine

## 2016-10-23 DIAGNOSIS — F418 Other specified anxiety disorders: Secondary | ICD-10-CM | POA: Diagnosis not present

## 2016-10-23 DIAGNOSIS — G4733 Obstructive sleep apnea (adult) (pediatric): Secondary | ICD-10-CM | POA: Diagnosis not present

## 2016-10-23 DIAGNOSIS — K588 Other irritable bowel syndrome: Secondary | ICD-10-CM | POA: Diagnosis not present

## 2016-10-23 DIAGNOSIS — M1990 Unspecified osteoarthritis, unspecified site: Secondary | ICD-10-CM | POA: Diagnosis not present

## 2016-10-23 DIAGNOSIS — E785 Hyperlipidemia, unspecified: Secondary | ICD-10-CM | POA: Diagnosis not present

## 2016-10-23 DIAGNOSIS — Z79891 Long term (current) use of opiate analgesic: Secondary | ICD-10-CM | POA: Diagnosis not present

## 2016-10-23 DIAGNOSIS — J449 Chronic obstructive pulmonary disease, unspecified: Secondary | ICD-10-CM | POA: Diagnosis not present

## 2016-10-23 DIAGNOSIS — G4700 Insomnia, unspecified: Secondary | ICD-10-CM | POA: Diagnosis not present

## 2016-10-23 DIAGNOSIS — K219 Gastro-esophageal reflux disease without esophagitis: Secondary | ICD-10-CM | POA: Diagnosis not present

## 2016-10-23 DIAGNOSIS — R279 Unspecified lack of coordination: Secondary | ICD-10-CM | POA: Diagnosis not present

## 2016-10-23 DIAGNOSIS — I110 Hypertensive heart disease with heart failure: Secondary | ICD-10-CM | POA: Diagnosis not present

## 2016-10-23 DIAGNOSIS — I509 Heart failure, unspecified: Secondary | ICD-10-CM | POA: Diagnosis not present

## 2016-10-23 DIAGNOSIS — E119 Type 2 diabetes mellitus without complications: Secondary | ICD-10-CM | POA: Diagnosis not present

## 2016-10-23 NOTE — Telephone Encounter (Signed)
Spoke with Rep Ebony Hail from [facility] , gave the okay for verbal orders for Pt. Ebony Hail states that she will send orders via fax for provider sign off. LB

## 2016-10-23 NOTE — Telephone Encounter (Signed)
See previous phone note. Phone note duplicated. LB

## 2016-10-23 NOTE — Telephone Encounter (Signed)
Caller name: Bryson Ha Relationship to patient: [facility] Can be reached: [phone] Pharmacy:  Reason for call: Request verbal order for PT 3 times a week for 2 weeks and then 2 times a week for 2 weeks, for strengthening, balance and endurance. Patient is currently in [facility]

## 2016-10-24 NOTE — Telephone Encounter (Signed)
Webb Silversmith from [facility] called requesting orders for patient to have PT 2 days per week for 5 weeks. She can be reached at [phone] for verbal order.

## 2016-10-25 NOTE — Telephone Encounter (Signed)
Spoke with Webb Silversmith for [facility]  to the okay for verbal order for pt to receive OT 2 days per wk for 5 wks. Webb Silversmith states she will send orders via fax for provider sign off. LB

## 2016-10-26 ENCOUNTER — Telehealth: Payer: Self-pay | Admitting: Family Medicine

## 2016-10-26 DIAGNOSIS — G4733 Obstructive sleep apnea (adult) (pediatric): Secondary | ICD-10-CM | POA: Diagnosis not present

## 2016-10-26 DIAGNOSIS — I110 Hypertensive heart disease with heart failure: Secondary | ICD-10-CM | POA: Diagnosis not present

## 2016-10-26 DIAGNOSIS — K588 Other irritable bowel syndrome: Secondary | ICD-10-CM | POA: Diagnosis not present

## 2016-10-26 DIAGNOSIS — G4700 Insomnia, unspecified: Secondary | ICD-10-CM | POA: Diagnosis not present

## 2016-10-26 DIAGNOSIS — F418 Other specified anxiety disorders: Secondary | ICD-10-CM | POA: Diagnosis not present

## 2016-10-26 DIAGNOSIS — J449 Chronic obstructive pulmonary disease, unspecified: Secondary | ICD-10-CM | POA: Diagnosis not present

## 2016-10-26 DIAGNOSIS — E785 Hyperlipidemia, unspecified: Secondary | ICD-10-CM | POA: Diagnosis not present

## 2016-10-26 DIAGNOSIS — R279 Unspecified lack of coordination: Secondary | ICD-10-CM | POA: Diagnosis not present

## 2016-10-26 DIAGNOSIS — I509 Heart failure, unspecified: Secondary | ICD-10-CM | POA: Diagnosis not present

## 2016-10-26 DIAGNOSIS — M1990 Unspecified osteoarthritis, unspecified site: Secondary | ICD-10-CM | POA: Diagnosis not present

## 2016-10-26 DIAGNOSIS — Z79891 Long term (current) use of opiate analgesic: Secondary | ICD-10-CM | POA: Diagnosis not present

## 2016-10-26 DIAGNOSIS — E119 Type 2 diabetes mellitus without complications: Secondary | ICD-10-CM | POA: Diagnosis not present

## 2016-10-26 DIAGNOSIS — K219 Gastro-esophageal reflux disease without esophagitis: Secondary | ICD-10-CM | POA: Diagnosis not present

## 2016-10-26 NOTE — Telephone Encounter (Signed)
Patient is concerned because she is currently at [facility] and she has not had her sugars checked in 9 days. She stated that someone from [facility] was supposed to call and get orders but I don't see where anyone has called regarding patient's sugars. Patient would like a call back regarding this issue. Please advise   Patient phone: [phone]

## 2016-10-27 DIAGNOSIS — I110 Hypertensive heart disease with heart failure: Secondary | ICD-10-CM | POA: Diagnosis not present

## 2016-10-27 DIAGNOSIS — E785 Hyperlipidemia, unspecified: Secondary | ICD-10-CM | POA: Diagnosis not present

## 2016-10-27 DIAGNOSIS — Z79891 Long term (current) use of opiate analgesic: Secondary | ICD-10-CM | POA: Diagnosis not present

## 2016-10-27 DIAGNOSIS — K219 Gastro-esophageal reflux disease without esophagitis: Secondary | ICD-10-CM | POA: Diagnosis not present

## 2016-10-27 DIAGNOSIS — G4733 Obstructive sleep apnea (adult) (pediatric): Secondary | ICD-10-CM | POA: Diagnosis not present

## 2016-10-27 DIAGNOSIS — M1990 Unspecified osteoarthritis, unspecified site: Secondary | ICD-10-CM | POA: Diagnosis not present

## 2016-10-27 DIAGNOSIS — F418 Other specified anxiety disorders: Secondary | ICD-10-CM | POA: Diagnosis not present

## 2016-10-27 DIAGNOSIS — J449 Chronic obstructive pulmonary disease, unspecified: Secondary | ICD-10-CM | POA: Diagnosis not present

## 2016-10-27 DIAGNOSIS — G4700 Insomnia, unspecified: Secondary | ICD-10-CM | POA: Diagnosis not present

## 2016-10-27 DIAGNOSIS — R279 Unspecified lack of coordination: Secondary | ICD-10-CM | POA: Diagnosis not present

## 2016-10-27 DIAGNOSIS — K588 Other irritable bowel syndrome: Secondary | ICD-10-CM | POA: Diagnosis not present

## 2016-10-27 DIAGNOSIS — E119 Type 2 diabetes mellitus without complications: Secondary | ICD-10-CM | POA: Diagnosis not present

## 2016-10-27 DIAGNOSIS — I509 Heart failure, unspecified: Secondary | ICD-10-CM | POA: Diagnosis not present

## 2016-10-29 NOTE — Telephone Encounter (Signed)
Spoke with pt Friday, pt stated she was on prednisone a couple weeks ago and they were checking her blood sugar. Pt states that she is no longer on prednisone and wanted to know why did they stop checking her blood sugar. Pt stated someone from [facility] program was suppose to call and obtain an order from provider. Pt had no idea of the woman's name or telephone number Pt would like to have blood sugar check daily. Advise pt to have the person she is referring to give the office a call. LB

## 2016-10-29 NOTE — Telephone Encounter (Signed)
Patient is calling again, request call back

## 2016-10-30 ENCOUNTER — Encounter: Payer: Self-pay | Admitting: Medical

## 2016-10-30 ENCOUNTER — Ambulatory Visit (HOSPITAL_BASED_OUTPATIENT_CLINIC_OR_DEPARTMENT_OTHER)
Admission: RE | Admit: 2016-10-30 | Discharge: 2016-10-30 | Disposition: A | Discharge status: Home / Self Care | Payer: PPO | Source: Ambulatory Visit | Attending: Medical | Admitting: Medical

## 2016-10-30 ENCOUNTER — Ambulatory Visit (INDEPENDENT_AMBULATORY_CARE_PROVIDER_SITE_OTHER): Payer: PPO | Admitting: Medical

## 2016-10-30 VITALS — BP 124/80 | HR 81 | Temp 98.3°F | Ht 64.0 in | Wt 241.0 lb

## 2016-10-30 DIAGNOSIS — J849 Interstitial pulmonary disease, unspecified: Secondary | ICD-10-CM | POA: Insufficient documentation

## 2016-10-30 DIAGNOSIS — E118 Type 2 diabetes mellitus with unspecified complications: Secondary | ICD-10-CM | POA: Diagnosis not present

## 2016-10-30 DIAGNOSIS — I509 Heart failure, unspecified: Secondary | ICD-10-CM | POA: Insufficient documentation

## 2016-10-30 DIAGNOSIS — J441 Chronic obstructive pulmonary disease with (acute) exacerbation: Secondary | ICD-10-CM | POA: Insufficient documentation

## 2016-10-30 DIAGNOSIS — Z8701 Personal history of pneumonia (recurrent): Secondary | ICD-10-CM

## 2016-10-30 DIAGNOSIS — R062 Wheezing: Secondary | ICD-10-CM | POA: Diagnosis not present

## 2016-10-30 LAB — CBC WITH DIFFERENTIAL/PLATELET
BASOS PCT: 0.4 % (ref 0.0–3.0)
Basophils Absolute: 0 10*3/uL (ref 0.0–0.1)
EOS PCT: 0.9 % (ref 0.0–5.0)
Eosinophils Absolute: 0.1 10*3/uL (ref 0.0–0.7)
HCT: 36.9 % (ref 36.0–46.0)
Hemoglobin: 12.1 g/dL (ref 12.0–15.0)
LYMPHS ABS: 2.3 10*3/uL (ref 0.7–4.0)
Lymphocytes Relative: 28.7 % (ref 12.0–46.0)
MCHC: 32.8 g/dL (ref 30.0–36.0)
MCV: 88.8 fl (ref 78.0–100.0)
MONO ABS: 0.8 10*3/uL (ref 0.1–1.0)
MONOS PCT: 9.4 % (ref 3.0–12.0)
NEUTROS PCT: 60.6 % (ref 43.0–77.0)
Neutro Abs: 4.9 10*3/uL (ref 1.4–7.7)
Platelets: 340 10*3/uL (ref 150.0–400.0)
RBC: 4.15 Mil/uL (ref 3.87–5.11)
RDW: 20 % — AB (ref 11.5–15.5)
WBC: 8.1 10*3/uL (ref 4.0–10.5)

## 2016-10-30 LAB — COMPREHENSIVE METABOLIC PANEL
ALBUMIN: 3.8 g/dL (ref 3.5–5.2)
ALK PHOS: 89 U/L (ref 39–117)
ALT: 19 U/L (ref 0–35)
AST: 16 U/L (ref 0–37)
BUN: 25 mg/dL — ABNORMAL HIGH (ref 6–23)
CHLORIDE: 104 meq/L (ref 96–112)
CO2: 28 mEq/L (ref 19–32)
Calcium: 9.4 mg/dL (ref 8.4–10.5)
Creatinine, Ser: 1.14 mg/dL (ref 0.40–1.20)
GFR: 49.61 mL/min — AB (ref >60.00)
Glucose, Bld: 104 mg/dL — ABNORMAL HIGH (ref 70–99)
POTASSIUM: 4.5 meq/L (ref 3.5–5.1)
Sodium: 139 mEq/L (ref 135–145)
TOTAL PROTEIN: 6.5 g/dL (ref 6.0–8.3)
Total Bilirubin: 0.3 mg/dL (ref 0.2–1.2)

## 2016-10-30 LAB — BRAIN NATRIURETIC PEPTIDE: Pro B Natriuretic peptide (BNP): 250 pg/mL — ABNORMAL HIGH (ref 0.0–100.0)

## 2016-10-30 MED ORDER — IPRATROPIUM BROMIDE HFA 17 MCG/ACT IN AERS: 2.0000 | INHALATION_SPRAY | Freq: Four times a day (QID) | RESPIRATORY_TRACT | 12 refills | Status: AC | PRN

## 2016-10-30 NOTE — Progress Notes (Signed)
Pre visit review using our clinic review tool, if applicable. No additional management support is needed unless otherwise documented below in the visit note. 

## 2016-10-30 NOTE — Progress Notes (Signed)
Subjective:    Patient ID: Stephanie Mckenzie, female    DOB: 1941/05/25, 75 y.o.   MRN: [id number]  HPI  Pt in for follow up Pt was admitted to hospital.  When was admitted to [facility] for copd, chf and pneumonia end of october. Pt had was treated and then evaluated by pulmonologist follow up from the hospital.   Pt states couple of weeks since discharge. Pt was admitted to [facility]. She is using a walker now  and going through PT.   Pt is on one diuretics. Lasix by [facility] review med list.(pt used to be on demadex 20 mg bid but not on current list.   Pt has albuterol inhaler with her at nursing home. No other inhaler seen on her med list from [place] is diabetic. Pt has not had her blood sugar checked for 2 weeks.   Pt o2 sat is 98% today.  Pt feels stable over past 2 wks. No severe dyspnea as she had prior to admission to hospital.          Review of Systems  Constitutional: Negative for chills, fatigue and fever.  HENT: Negative for congestion, sinus pain and sinus pressure.   Respiratory: Positive for wheezing. Negative for cough and shortness of breath.        Minimal occasional wheezing per pt.  Cardiovascular: Negative for chest pain and palpitations.  Gastrointestinal: Negative for abdominal pain.  Musculoskeletal: Negative for back pain.  Neurological: Negative for dizziness and headaches.  Hematological: Negative for adenopathy. Does not bruise/bleed easily.  Psychiatric/Behavioral: Negative for behavioral problems and confusion.    Past Medical History:  Diagnosis Date  . CHF (congestive heart failure) ([place])   . Depression   . Edema 10/09/2015  . GERD (gastroesophageal reflux disease)   . High cholesterol   . Hypertension   . Migraine      Social History   Social History  . Marital status: Divorced    Spouse name: N/A  . Number of children: 2  . Years of education: N/A   Occupational History  . Retired    Social History  Main Topics  . Smoking status: Former Smoker    Packs/day: 1.00    Years: 30.00    Types: Cigarettes    Quit date: 08/31/2015  . Smokeless tobacco: Never Used  . Alcohol use 0.0 oz/week     Comment: occasional  . Drug use: No  . Sexual activity: Not on file   Other Topics Concern  . Not on file   Social History Narrative  . No narrative on file    Past Surgical History:  Procedure Laterality Date  . ABDOMINAL HYSTERECTOMY    . APPENDECTOMY    . CESAREAN SECTION     x 2  . CORONARY ANGIOPLASTY    . LAMINECTOMY    . PARATHYROIDECTOMY      Family History  Problem Relation Age of Onset  . Diabetes Mother   . Hypertension Mother   . Thyroid disease Mother     Allergies  Allergen Reactions  . Dilaudid [Hydromorphone] Other (See Comments)    Per daughter cause confusion and hallucinations. Causes confusion and hallucinations per pt's daughter  . Lorazepam Rash and Other (See Comments)    Other reaction(s): Hallucinations hallucinations  hallucinations  hallucinations   . Morphine And Related Itching  . Acyclovir And Related   . Amlodipine Besylate     Pedal edema  . Buprenorphine Hcl Itching  Current Outpatient Prescriptions on File Prior to Visit  Medication Sig Dispense Refill  . albuterol (PROAIR HFA) 108 (90 Base) MCG/ACT inhaler Inhale 2 puffs into the lungs every 6 (six) hours as needed for wheezing or shortness of breath. 1 Inhaler 4  . alendronate (FOSAMAX) 70 MG tablet Take 1 tablet (70 mg total) by mouth every 7 (seven) days. Take with a full glass of water on an empty stomach. 4 tablet 11  . aspirin 81 MG tablet Take 81 mg by mouth daily.    Marland Kitchen atorvastatin (LIPITOR) 40 MG tablet TAKE ONE TABLET BY MOUTH ONCE DAILY 90 tablet 1  . CARTIA XT 180 MG 24 hr capsule TAKE ONE CAPSULE BY MOUTH ONCE DAILY 90 capsule 0  . FLUoxetine (PROZAC) 40 MG capsule Take 1 capsule by mouth daily.    . fluticasone (FLONASE) 50 MCG/ACT nasal spray Place 2 sprays into both  nostrils daily. 16 g 6  . folic acid (FOLVITE) 1 MG tablet TAKE ONE TABLET BY MOUTH ONCE DAILY 30 tablet 11  . furosemide (LASIX) 20 MG tablet TAKE ONE TABLET BY MOUTH ONCE DAILY AS NEEDED FOR  EDEMA 30 tablet 2  . glucose blood test strip Check blood sugar no more than three times daily. 300 each 5  . metoprolol succinate (TOPROL-XL) 50 MG 24 hr tablet TAKE ONE TABLET BY MOUTH TWICE DAILY 60 tablet 5  . mirtazapine (REMERON) 30 MG tablet Take 30 mg by mouth at bedtime.    . NONFORMULARY OR COMPOUNDED ITEM Compression stockings 20-30 mg hg #1 pair 1 each 0  . nystatin (MYCOSTATIN/NYSTOP) 100000 UNIT/GM POWD Apply up to qid 60 g 1  . omeprazole (PRILOSEC) 40 MG capsule Take 40 mg by mouth 2 (two) times daily.    . ONE TOUCH LANCETS MISC Check blood sugar no more than three times daily 300 each 5  . oxybutynin (DITROPAN XL) 15 MG 24 hr tablet TAKE ONE TABLET BY MOUTH ONCE DAILY 30 tablet 11  . potassium chloride (K-DUR) 10 MEQ tablet TAKE ONE TABLET BY MOUTH THREE TIMES DAILY 90 tablet 2  . torsemide (DEMADEX) 20 MG tablet TAKE TWO TABLETS BY MOUTH ONCE DAILY 60 tablet 2  . Vitamin D, Ergocalciferol, (DRISDOL) 50000 units CAPS capsule Take 1 capsule (50,000 Units total) by mouth every 7 (seven) days. 6 capsule 0  . zolpidem (AMBIEN) 5 MG tablet Take 5 mg by mouth once.     No current facility-administered medications on file prior to visit.     BP 124/80 (BP Location: Left Arm, Patient Position: Sitting, Cuff Size: Normal)   Pulse 81   Temp 98.3 F (36.8 C) (Oral)   Ht 5\' 4"  (1.626 m)   Wt 241 lb (109.3 kg)   SpO2 98%   BMI 41.37 kg/m       Objective:   Physical Exam  General Mental Status- Alert. General Appearance- Not in acute distress.   Skin General: Color- Normal Color. Moisture- Normal Moisture.  Neck Carotid Arteries- Normal color. Moisture- Normal Moisture. No carotid bruits. No JVD.  Chest and Lung Exam Auscultation: Breath  Sounds:-Normal.  Cardiovascular Auscultation:Rythm- Regular. Murmurs & Other Heart Sounds:Auscultation of the heart reveals- No Murmurs.  Abdomen Inspection:-Inspeection Normal. Palpation/Percussion:Note:No mass. Palpation and Percussion of the abdomen reveal- Non Tender, Non Distended + BS, no rebound or guarding.    Neurologic Cranial Nerve exam:- CN III-XII intact(No nystagmus), symmetric smile. Strength:- 5/5 equal and symmetric strength both upper and lower extremities.  Lower ext- faint  1+ pedal edema at best bilaterally. Negative homans signs.      Assessment & Plan:  Placed order on [facility] form  to check weight daily. Fax [place] weight every 2 days. If any weight gain more than 3 lbs in one day notify us.  Get cxr today and bnp. Continue diuretics.  For hx of pneumonia follow cxr.  For copd continue albuterol. Will add atrovent inhaler.  Check bs 3 times daily and get a1c today. Continue oral diabetic meds. If sugars over 300 asked [facility] to call us. If sugars over 400 then ED evaluation.  Follow up this coming Monday or as needed

## 2016-10-30 NOTE — Patient Instructions (Addendum)
Placed orders on [facility] form asking  to check weight daily. Fax [place] weight every 2 days. If any weight gain more than 3 lbs in one day notify us.  Get cxr today and bnp. Continue diuretics.  For hx of pneumonia follow cxr.  For copd continue albuterol. Will add atrovent inhaler.  Check bs 3 times daily and get a1c today. Continue oral diabetic meds. If sugars over 300 asked [facility] to call us. If sugars over 400 then ED evaluation.  Follow up this coming Monday or as needed

## 2016-11-01 ENCOUNTER — Other Ambulatory Visit (INDEPENDENT_AMBULATORY_CARE_PROVIDER_SITE_OTHER): Payer: PPO

## 2016-11-01 ENCOUNTER — Telehealth: Payer: Self-pay

## 2016-11-01 DIAGNOSIS — E118 Type 2 diabetes mellitus with unspecified complications: Secondary | ICD-10-CM | POA: Diagnosis not present

## 2016-11-01 LAB — HEMOGLOBIN A1C: HEMOGLOBIN A1C: 7.5 % — AB (ref 4.6–6.5)

## 2016-11-01 MED ORDER — METFORMIN HCL ER 500 MG PO TB24: 500.0000 mg | ORAL_TABLET | Freq: Every day | ORAL | 3 refills | Status: DC

## 2016-11-01 MED ORDER — DOXYCYCLINE HYCLATE 100 MG PO TABS: 100.0000 mg | ORAL_TABLET | Freq: Two times a day (BID) | ORAL | 0 refills | Status: DC

## 2016-11-01 NOTE — Telephone Encounter (Addendum)
Notes Recorded by Rudene Anda, RN on 11/01/2016 at 1:52 PM EST Pt notified and made aware. She stated understanding and agreed with plan. Rx sent to [facility] on [address] as requested by patient. Pt states daughter will pick up medication for her. While on the phone, she also stated she was a little frustrated that she was just told by her home PT that her PT claim was recently denied by her insurance company and she says that she still needs PT. She is still having to walk with a walker. Also, she asked for her A1C results, but none were noted in chart. Reached out to lab to see if A1C can be added to patient's more recent lab work. ------  Notes Recorded by Mackie Pai, PA-C on 10/30/2016 at 5:29 PM EST Pt infection fighting cells were not elevated. ------  Notes Recorded by Mackie Pai, PA-C on 10/30/2016 at 5:29 PM EST Pt heart failure protein study was lower than before. Which is good. Her kidney function looked stable. Her chest xray showed residual pneumonia. Will you call [facility]. This is where she is presently. I want her to start doxycycline 100 mg 1 tab po bid x 10 days(make sure staff aware she should take with food/not on empty stomach). Let me know if this needs to be printed and then faxed to them. If you print the rx I will sign. Thanks.

## 2016-11-01 NOTE — Telephone Encounter (Signed)
Called patient and asked to speak to nurse.  Caryl Pina, Med tech was available.  She reviewed patient's med list and stated that she's not on doxycycline. She's on dicyclomine instead.  Caryl Pina was informed that a new rx for doxycycline has been sent to the pharmacy for patient to take it twice a day for 10 days.  She stated understanding and asked that an order be faxed to [facility] at [phone].  Order faxed. Awaiting fax confirmation.

## 2016-11-01 NOTE — Telephone Encounter (Signed)
Rx metformin sent in to pharmacy. Can you notify pt and family. Nanine Means would need to made aware as well. Call and ask can we send copy of printed prescription? Or do we addend form that was sent yesterday.

## 2016-11-01 NOTE — Telephone Encounter (Signed)
Patient called back to inform she's currently on doxycycline therefore please cancel doxycycline Rx no need to send to pharmacy. Patient stated RN would follow up regarding most recent A1C and lab results, please advise

## 2016-11-02 NOTE — Telephone Encounter (Signed)
Called patient and made her aware of the new Rx for metformin. Pt stated she was out with her son and they were on their way to pick up her prescriptions now.    Called [facility] at [phone] and spoke to Sheppard Plumber, LPN regarding patient's orders for doxycycline, which she says she has received, and metformin, which she asked for orders to be faxed over.  Faxed a copy of patient's medication report for metformin with provider's signature.  Fax confirmation received.  Porfirio Mylar says she also plans to fax over additional orders from the facility as well as blank order sheets that we can use for any future orders the office may have the patient.  Awaiting paperwork.

## 2016-11-05 ENCOUNTER — Ambulatory Visit (INDEPENDENT_AMBULATORY_CARE_PROVIDER_SITE_OTHER): Payer: PPO | Admitting: Medical

## 2016-11-05 ENCOUNTER — Encounter: Payer: Self-pay | Admitting: Medical

## 2016-11-05 ENCOUNTER — Ambulatory Visit (HOSPITAL_BASED_OUTPATIENT_CLINIC_OR_DEPARTMENT_OTHER)
Admission: RE | Admit: 2016-11-05 | Discharge: 2016-11-05 | Disposition: A | Discharge status: Home / Self Care | Payer: PPO | Source: Ambulatory Visit | Attending: Medical | Admitting: Medical

## 2016-11-05 VITALS — BP 116/68 | HR 87 | Temp 98.0°F | Ht 64.0 in | Wt 246.4 lb

## 2016-11-05 DIAGNOSIS — Z8701 Personal history of pneumonia (recurrent): Secondary | ICD-10-CM | POA: Diagnosis not present

## 2016-11-05 DIAGNOSIS — G4730 Sleep apnea, unspecified: Secondary | ICD-10-CM | POA: Diagnosis not present

## 2016-11-05 DIAGNOSIS — I509 Heart failure, unspecified: Secondary | ICD-10-CM

## 2016-11-05 DIAGNOSIS — R918 Other nonspecific abnormal finding of lung field: Secondary | ICD-10-CM | POA: Insufficient documentation

## 2016-11-05 LAB — CBC WITH DIFFERENTIAL/PLATELET
BASOS PCT: 0.4 % (ref 0.0–3.0)
Basophils Absolute: 0 10*3/uL (ref 0.0–0.1)
EOS ABS: 0.1 10*3/uL (ref 0.0–0.7)
Eosinophils Relative: 1.1 % (ref 0.0–5.0)
HEMATOCRIT: 35.4 % — AB (ref 36.0–46.0)
Hemoglobin: 11.5 g/dL — ABNORMAL LOW (ref 12.0–15.0)
LYMPHS PCT: 28.3 % (ref 12.0–46.0)
Lymphs Abs: 2.1 10*3/uL (ref 0.7–4.0)
MCHC: 32.4 g/dL (ref 30.0–36.0)
MCV: 89.9 fl (ref 78.0–100.0)
Monocytes Absolute: 0.8 10*3/uL (ref 0.1–1.0)
Monocytes Relative: 10.2 % (ref 3.0–12.0)
Neutro Abs: 4.5 10*3/uL (ref 1.4–7.7)
Neutrophils Relative %: 60 % (ref 43.0–77.0)
PLATELETS: 251 10*3/uL (ref 150.0–400.0)
RBC: 3.94 Mil/uL (ref 3.87–5.11)
RDW: 20 % — AB (ref 11.5–15.5)
WBC: 7.4 10*3/uL (ref 4.0–10.5)

## 2016-11-05 LAB — COMPREHENSIVE METABOLIC PANEL
ALT: 15 U/L (ref 0–35)
AST: 14 U/L (ref 0–37)
Albumin: 3.7 g/dL (ref 3.5–5.2)
Alkaline Phosphatase: 79 U/L (ref 39–117)
BUN: 19 mg/dL (ref 6–23)
CALCIUM: 9 mg/dL (ref 8.4–10.5)
CHLORIDE: 110 meq/L (ref 96–112)
CO2: 22 meq/L (ref 19–32)
CREATININE: 0.94 mg/dL (ref 0.40–1.20)
GFR: 61.98 mL/min (ref >60.00)
GLUCOSE: 90 mg/dL (ref 70–99)
Potassium: 4.8 mEq/L (ref 3.5–5.1)
Sodium: 140 mEq/L (ref 135–145)
Total Bilirubin: 0.3 mg/dL (ref 0.2–1.2)
Total Protein: 6.3 g/dL (ref 6.0–8.3)

## 2016-11-05 LAB — BRAIN NATRIURETIC PEPTIDE: Pro B Natriuretic peptide (BNP): 358 pg/mL — ABNORMAL HIGH (ref 0.0–100.0)

## 2016-11-05 NOTE — Telephone Encounter (Signed)
Facility aware of new order for doxycycline.

## 2016-11-05 NOTE — Progress Notes (Signed)
Subjective:    Patient ID: KATELEE GIMENO, female    DOB: 1941-02-23, 75 y.o.   MRN: [id number]  HPI    Pt in for follow up form [facility].  Wanted to assess if she had symptoms of pneumonia since last cxr showed some residual pneumonia, and assess today  if any signs/symptoms of chf.  Pt states she feels alright. Not reporting shortness of breath or wheezing at rest. But on acitvity seems to get short of breath mildly.  Pt had been doing PT and OT therapy. She was having therapy in [place]. But then that was dc'd.  Pt has been taking the antibiotic and she does not want to take mucinex.  Family had some questions on getting medications. We area waiting on med list from [place].   Pt is on Lasix presently and on potassium.   Pt should be getting weighted daily as well. Yesterday weight was 240 lb  at [facility] but different scale.   Pt also has some  severe sleep apnea. Pt had positive sleep study that was + for sleep apnea. She was supposed to have a second study in January. [place] did the first sudy.    Review of Systems  Constitutional: Negative for chills, fatigue and fever.  Respiratory: Positive for shortness of breath. Negative for cough, chest tightness and wheezing.        At rest none but some with acitivity.  Cardiovascular: Negative for chest pain and palpitations.  Gastrointestinal: Negative for abdominal pain.  Genitourinary: Negative for difficulty urinating, dysuria, flank pain and frequency.  Skin: Negative for rash.  Neurological: Negative for facial asymmetry, speech difficulty, weakness, light-headedness and numbness.  Hematological: Negative for adenopathy. Does not bruise/bleed easily.  Psychiatric/Behavioral: Negative for behavioral problems, confusion, decreased concentration and dysphoric mood.    Past Medical History:  Diagnosis Date  . CHF (congestive heart failure) ([place])   . Depression   . Edema 10/09/2015  . GERD (gastroesophageal reflux  disease)   . High cholesterol   . Hypertension   . Migraine      Social History   Social History  . Marital status: Divorced    Spouse name: N/A  . Number of children: 2  . Years of education: N/A   Occupational History  . Retired    Social History Main Topics  . Smoking status: Former Smoker    Packs/day: 1.00    Years: 30.00    Types: Cigarettes    Quit date: 08/31/2015  . Smokeless tobacco: Never Used  . Alcohol use 0.0 oz/week     Comment: occasional  . Drug use: No  . Sexual activity: Not on file   Other Topics Concern  . Not on file   Social History Narrative  . No narrative on file    Past Surgical History:  Procedure Laterality Date  . ABDOMINAL HYSTERECTOMY    . APPENDECTOMY    . CESAREAN SECTION     x 2  . CORONARY ANGIOPLASTY    . LAMINECTOMY    . PARATHYROIDECTOMY      Family History  Problem Relation Age of Onset  . Diabetes Mother   . Hypertension Mother   . Thyroid disease Mother     Allergies  Allergen Reactions  . Dilaudid [Hydromorphone] Other (See Comments)    Per daughter cause confusion and hallucinations. Causes confusion and hallucinations per pt's daughter  . Lorazepam Rash and Other (See Comments)    Other reaction(s): Hallucinations hallucinations  hallucinations  hallucinations   . Morphine And Related Itching  . Acyclovir And Related   . Amlodipine Besylate     Pedal edema  . Buprenorphine Hcl Itching    Current Outpatient Prescriptions on File Prior to Visit  Medication Sig Dispense Refill  . albuterol (PROAIR HFA) 108 (90 Base) MCG/ACT inhaler Inhale 2 puffs into the lungs every 6 (six) hours as needed for wheezing or shortness of breath. 1 Inhaler 4  . alendronate (FOSAMAX) 70 MG tablet Take 1 tablet (70 mg total) by mouth every 7 (seven) days. Take with a full glass of water on an empty stomach. 4 tablet 11  . aspirin 81 MG tablet Take 81 mg by mouth daily.    Marland Kitchen atorvastatin (LIPITOR) 40 MG tablet TAKE ONE  TABLET BY MOUTH ONCE DAILY 90 tablet 1  . CARTIA XT 180 MG 24 hr capsule TAKE ONE CAPSULE BY MOUTH ONCE DAILY 90 capsule 0  . doxycycline (VIBRA-TABS) 100 MG tablet Take 1 tablet (100 mg total) by mouth 2 (two) times daily. Take for 10 days with food. 20 tablet 0  . FLUoxetine (PROZAC) 40 MG capsule Take 1 capsule by mouth daily.    . fluticasone (FLONASE) 50 MCG/ACT nasal spray Place 2 sprays into both nostrils daily. 16 g 6  . folic acid (FOLVITE) 1 MG tablet TAKE ONE TABLET BY MOUTH ONCE DAILY 30 tablet 11  . furosemide (LASIX) 20 MG tablet TAKE ONE TABLET BY MOUTH ONCE DAILY AS NEEDED FOR  EDEMA 30 tablet 2  . glucose blood test strip Check blood sugar no more than three times daily. 300 each 5  . ipratropium (ATROVENT HFA) 17 MCG/ACT inhaler Inhale 2 puffs into the lungs every 6 (six) hours as needed for wheezing. 1 Inhaler 12  . metFORMIN (GLUCOPHAGE XR) 500 MG 24 hr tablet Take 1 tablet (500 mg total) by mouth daily with breakfast. 30 tablet 3  . metoprolol succinate (TOPROL-XL) 50 MG 24 hr tablet TAKE ONE TABLET BY MOUTH TWICE DAILY 60 tablet 5  . mirtazapine (REMERON) 30 MG tablet Take 30 mg by mouth at bedtime.    . NONFORMULARY OR COMPOUNDED ITEM Compression stockings 20-30 mg hg #1 pair 1 each 0  . nystatin (MYCOSTATIN/NYSTOP) 100000 UNIT/GM POWD Apply up to qid 60 g 1  . omeprazole (PRILOSEC) 40 MG capsule Take 40 mg by mouth 2 (two) times daily.    . ONE TOUCH LANCETS MISC Check blood sugar no more than three times daily 300 each 5  . oxybutynin (DITROPAN XL) 15 MG 24 hr tablet TAKE ONE TABLET BY MOUTH ONCE DAILY 30 tablet 11  . potassium chloride (K-DUR) 10 MEQ tablet TAKE ONE TABLET BY MOUTH THREE TIMES DAILY 90 tablet 2  . torsemide (DEMADEX) 20 MG tablet TAKE TWO TABLETS BY MOUTH ONCE DAILY 60 tablet 2  . Vitamin D, Ergocalciferol, (DRISDOL) 50000 units CAPS capsule Take 1 capsule (50,000 Units total) by mouth every 7 (seven) days. 6 capsule 0  . zolpidem (AMBIEN) 5 MG tablet  Take 5 mg by mouth once.     No current facility-administered medications on file prior to visit.     BP 116/68 (BP Location: Left Arm, Patient Position: Sitting, Cuff Size: Large)   Pulse 87   Temp 98 F (36.7 C) (Oral)   Ht 5\' 4"  (1.626 m)   Wt 246 lb 6.4 oz (111.8 kg)   SpO2 98%   BMI 42.29 kg/m       Objective:  Physical Exam   General Mental Status- Alert. General Appearance- Not in acute distress.   Skin General: Color- Normal Color. Moisture- Normal Moisture.  Neck Carotid Arteries- Normal color. Moisture- Normal Moisture. No carotid bruits. No JVD.  Chest and Lung Exam Auscultation: Breath Sounds:-Normal.(lying supine o2 sat 96%)  Cardiovascular Auscultation:Rythm- Regular. Murmurs & Other Heart Sounds:Auscultation of the heart reveals- No Murmurs.  Abdomen Inspection:-Inspeection Normal. Palpation/Percussion:Note:No mass. Palpation and Percussion of the abdomen reveal- Non Tender, Non Distended + BS, no rebound or guarding.  Neurologic Cranial Nerve exam:- CN III-XII intact(No nystagmus), symmetric smile. Strength:- 5/5 equal and symmetric strength both upper and lower extremities.  Lower ext 1+ pedal edema bilaterally. Negative homans signs.     Assessment & Plan:  Will get chest xray today, cmp and bnp today.  I will refer you back to pulmonologist.  I will talk with [facility] directly and [facility]afify if any meds recently ran out. Particularly the diuretics.  Adjust treatment for hx of chf and pedal edema after stat studies.  Follow up date to be determined after lab review.  [phone].([place])   I did talk with Nanine Means med tech and confirmed dosage of meds. Her cxr was stable, bnp minimal elevated compared to prior. Her k level was stable. Plan to increase her lasix and potassium(potassium increase to match diuretic increase). Notified pt daughter of results and plan. Will talk with pt pcp tomorrow and advise her on recent lab finding and  clinical exam. Then send med changes tomorrow am.  I asked Barnet Pall MA to fax over order to give additional 20 mg tablet  of lasix tonight. Will fax over other orders tomorrow morning after discussing with pcp.  Did talk with Gerald Leitz med tech and let her know fax order should be coming in.  Follow up in one week or as needed     Ahuva Poynor, Percell Miller, [employer]

## 2016-11-05 NOTE — Telephone Encounter (Signed)
Paperwork received and forwarded to [facility] for review and completion.

## 2016-11-05 NOTE — Patient Instructions (Addendum)
Will get chest xray today, cmp and bnp today.  I will refer you back to pulmonologist.  I will talk with [facility] directly and [facility] if any meds recently ran out. Particularly the diuretics.  Adjust treatment for hx of chf and pedal edema after stat studies.  Follow up date to be determined after lab review.  I did talk with Nanine Means med tech and confirmed dosage of meds. Her cxr was stable, bnp minimal elevated compared to prior. Her k level was stable. Plan to increase her lasix and potassium(potassium increase to match diuretic increase). Notified pt daughter of results and plan. Will talk with pt pcp tomorrow and advise her on recent lab finding and clinical exam. Then send med changes tomorrow am.   Follow up in one week or as needed

## 2016-11-06 ENCOUNTER — Telehealth: Payer: Self-pay | Admitting: Pulmonary Disease

## 2016-11-06 ENCOUNTER — Telehealth: Payer: Self-pay | Admitting: Family Medicine

## 2016-11-06 DIAGNOSIS — I509 Heart failure, unspecified: Secondary | ICD-10-CM

## 2016-11-06 NOTE — Telephone Encounter (Signed)
I filled out [facility] form today. Will you fax these orders over. And advise to follow up with me in one week. Will place order form on your desk.   Have them also follow up with me in Tuesday or Wed next week. 30 minute appointment.

## 2016-11-06 NOTE — Telephone Encounter (Signed)
I have called and spoke with Stephanie Mckenzie and she is aware of appt with JN in Jan 2018--she will keep this appt.  She is aware of appt with TP on 12-19 .  Stephanie Mckenzie to call back if this appt is not able to be kept.

## 2016-11-06 NOTE — Telephone Encounter (Signed)
Referral to cardiologist placed. 

## 2016-11-06 NOTE — Telephone Encounter (Signed)
Caller name: [place]  Relationship to patient: self Can be reached: [phone]  Reason for call: pt states discussing with Percell Miller getting help for PT/OT to be covered by ins. She said that he was going to call insurance or Nanine Means or [facility] and find out why it wasn't being covered. Pt states that she is having to use a walker and afraid to go w/out it due to balance and strength issues. Pt request call backk.

## 2016-11-08 NOTE — Addendum Note (Signed)
Addended by: Tasia Catchings on: 11/08/2016 05:25 PM   Modules accepted: Orders

## 2016-11-13 ENCOUNTER — Ambulatory Visit (INDEPENDENT_AMBULATORY_CARE_PROVIDER_SITE_OTHER): Payer: PPO | Admitting: Adult Health

## 2016-11-13 ENCOUNTER — Encounter: Payer: Self-pay | Admitting: Adult Health

## 2016-11-13 VITALS — BP 120/60 | HR 88 | Temp 97.7°F | Ht 64.0 in | Wt 241.4 lb

## 2016-11-13 DIAGNOSIS — J189 Pneumonia, unspecified organism: Secondary | ICD-10-CM | POA: Insufficient documentation

## 2016-11-13 DIAGNOSIS — I519 Heart disease, unspecified: Secondary | ICD-10-CM | POA: Diagnosis not present

## 2016-11-13 DIAGNOSIS — R0600 Dyspnea, unspecified: Secondary | ICD-10-CM | POA: Diagnosis not present

## 2016-11-13 DIAGNOSIS — G4733 Obstructive sleep apnea (adult) (pediatric): Secondary | ICD-10-CM | POA: Diagnosis not present

## 2016-11-13 DIAGNOSIS — I5189 Other ill-defined heart diseases: Secondary | ICD-10-CM

## 2016-11-13 NOTE — Assessment & Plan Note (Signed)
Patient has underlying severe obstructive sleep apnea. ? OHS component  C Pap titration study is pending  Will begin therapy as soon as completed.

## 2016-11-13 NOTE — Progress Notes (Signed)
Note reviewed.  Sonia Baller Ashok Cordia, M.D. [facility] Pulmonary & Critical Care Pager:  [phone] After 3pm or if no response, call [phone] 6:31 PM 11/13/16

## 2016-11-13 NOTE — Patient Instructions (Addendum)
Go for your CPAP titration study in January as planned  Avoid sedating medications if possible  Would not take Ambien.  Will call with CPAP titration study results when available .  Follow up Dr. Ashok Cordia as planned in January for  PFT and chest xray .  Keep legs elevated Low salt diet  Please contact office for sooner follow up if symptoms do not improve or worsen or seek emergency care

## 2016-11-13 NOTE — Assessment & Plan Note (Signed)
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 X-ray does show gradual improvement in the right upper lobe consolidation He does have some underlying interstitial changes. Have her return for pulmonary function test and a chest x-ray in the distal arm and if she needs further testing with a possible underlying CT chest high resolution

## 2016-11-13 NOTE — Assessment & Plan Note (Signed)
Recent decompensated diastolic heart failure. Patient's continue a low-salt diet. Continue on her diuretic therapy and follow with cardiology as planned.

## 2016-11-13 NOTE — Progress Notes (Addendum)
@Patient  ID: Stephanie Mckenzie, female    DOB: [birth year], 75 y.o.   MRN: [id number]  Chief Complaint  Patient presents with  . Follow-up    OSA    Referring provider: Ann Held, *  HPI: 75 year old female seen for sleep consult 08/30/2016 sent for a sleep study that revealed severe obstructive sleep apnea on 10/11/16 with AHI at 45/h   11/13/2016 Follow up : OSA /PNA  Patient presents for a post hospital follow-up. Patient was referred over to pulmonary. From her primary care doctor after recent hospitalization at [facility] for MRSA pneumonia.  Patient was previously seen by our office for a sleep consult in October 2017 and found to have severe obstructive sleep apnea with AHI of 45/hr. . She is been set up for his C Pap titration study that is scheduled in January. She has not started on C Pap until this C Pap titration study is done. We have not seen patient for a pulmonary consult previously Patient says she is a former smoker. She says that she got sick last month and was admitted to [facility] and told that she had severe PNA .  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 She was treated with IV antibiotics, nebulized bronchodilators. And a prednisone taper. She was discharged on Levaquin and a slow prednisone taper. She also was found to have decompensated diastolic dysfunction and was diuresed.. Patient had an elevated P. Chrosognum IgG . 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   She was discharged to rehabilitation and is currently undergoing physical therapy. Most recent chest x-ray shows near-complete clearing of the right upper lobe infiltrate She does have underlying chronic interstitial markings.  Patient has no known diagnosis of chronic interstitial lung disease. She says she is followed by cardiology  and [place]. A recent echo August 2017 showed an EF ofh moderate to severe left ventricular hypertrophy. Grade 1 diastolic dysfunction.  She is on Lasix 20 mg daily and Zaroxolyn 2.5 mg daily. She says she gets leg swelling gets worse as the day goes on. She gets winded with walking long distance.. She does say that she believes that she had mold in her home..   Allergies  Allergen Reactions  . Dilaudid [Hydromorphone] Other (See Comments)    Per daughter cause confusion and hallucinations. Causes confusion and hallucinations per pt's daughter  . Lorazepam Rash and Other (See Comments)    Other reaction(s): Hallucinations hallucinations  hallucinations  hallucinations   . Morphine And Related Itching  . Acyclovir And Related   . Amlodipine Besylate     Pedal edema  . Buprenorphine Hcl Itching    Immunization History  Administered Date(s) Administered  . Tdap 10/23/2012    Past Medical History:  Diagnosis Date  . CHF (congestive heart failure) ([place])   . Depression   . Edema 10/09/2015  . GERD (gastroesophageal reflux disease)   . High cholesterol   . Hypertension   . Migraine     Tobacco History: History  Smoking Status  . Former Smoker  . Packs/day: 1.00  . Years: 30.00  . Types: Cigarettes  . Quit date: 08/31/2015  Smokeless Tobacco  . Never Used   Counseling given: Not Answered   Outpatient Encounter Prescriptions as of 11/13/2016  Medication Sig  . albuterol (PROAIR HFA) 108 (90 Base) MCG/ACT inhaler Inhale 2 puffs into  the lungs every 6 (six) hours as needed for wheezing or shortness of breath.  Marland Kitchen alendronate (FOSAMAX) 70 MG tablet Take 1 tablet (70 mg total) by mouth every 7 (seven) days. Take with a full glass of water on an empty stomach.  Marland Kitchen aspirin 81 MG tablet Take 81 mg by mouth daily.  Marland Kitchen atorvastatin (LIPITOR) 40 MG tablet TAKE ONE TABLET BY MOUTH ONCE DAILY  . butalbital-acetaminophen-caffeine (FIORICET, ESGIC) 50-325-40 MG tablet Take by mouth.  Take 1 tablet by mouth every six hours as needed.  Marland Kitchen CARTIA XT 180 MG 24 hr capsule TAKE ONE CAPSULE BY MOUTH ONCE DAILY  . dicyclomine (BENTYL) 10 MG capsule Take 10 mg by mouth 3 (three) times daily.  . diphenoxylate-atropine (LOMOTIL) 2.5-0.025 MG tablet Take by mouth. Take 1 tablet by mouth 4 times a day as needed for Diarrhea.  Marland Kitchen FLUoxetine (PROZAC) 40 MG capsule Take 1 capsule by mouth daily.  . fluticasone (FLONASE) 50 MCG/ACT nasal spray Place 2 sprays into both nostrils daily.  . folic acid (FOLVITE) 1 MG tablet TAKE ONE TABLET BY MOUTH ONCE DAILY  . furosemide (LASIX) 20 MG tablet TAKE ONE TABLET BY MOUTH ONCE DAILY AS NEEDED FOR  EDEMA  . glucose blood test strip Check blood sugar no more than three times daily.  Marland Kitchen ipratropium (ATROVENT HFA) 17 MCG/ACT inhaler Inhale 2 puffs into the lungs every 6 (six) hours as needed for wheezing.  . metFORMIN (GLUCOPHAGE XR) 500 MG 24 hr tablet Take 1 tablet (500 mg total) by mouth daily with breakfast.  . metolazone (ZAROXOLYN) 2.5 MG tablet Take 2.5 mg by mouth daily.  . metoprolol succinate (TOPROL-XL) 50 MG 24 hr tablet TAKE ONE TABLET BY MOUTH TWICE DAILY  . mirtazapine (REMERON) 30 MG tablet Take 30 mg by mouth at bedtime.  . NONFORMULARY OR COMPOUNDED ITEM Compression stockings 20-30 mg hg #1 pair  . nystatin (MYCOSTATIN/NYSTOP) 100000 UNIT/GM POWD Apply up to qid  . omeprazole (PRILOSEC) 40 MG capsule Take 40 mg by mouth 2 (two) times daily.  . ONE TOUCH LANCETS MISC Check blood sugar no more than three times daily  . oxybutynin (DITROPAN XL) 15 MG 24 hr tablet TAKE ONE TABLET BY MOUTH ONCE DAILY  . potassium chloride (K-DUR) 10 MEQ tablet TAKE ONE TABLET BY MOUTH THREE TIMES DAILY (Patient taking differently: Take 2 tablets Monday Wednesday and Friday.)  . traMADol (ULTRAM) 50 MG tablet Take 1 tablet by mouth as needed.  . Vitamin D, Ergocalciferol, (DRISDOL) 50000 units CAPS capsule Take 1 capsule (50,000 Units total) by mouth every 7  (seven) days.  Marland Kitchen zolpidem (AMBIEN) 5 MG tablet Take 5 mg by mouth once.  . furosemide (LASIX) 40 MG tablet Take 1 tablet by mouth once daily as needed for edema.   No facility-administered encounter medications on file as of 11/13/2016.      Review of Systems  Constitutional:   No  weight loss, night sweats,  Fevers, chills,  +fatigue, or  lassitude.  HEENT:   No headaches,  Difficulty swallowing,  Tooth/dental problems, or  Sore throat,                No sneezing, itching, ear ache, + nasal congestion, post nasal drip,   CV:  No chest pain,  Orthopnea, PND,  anasarca, dizziness, palpitations, syncope.   GI  No heartburn, indigestion, abdominal pain, nausea, vomiting, diarrhea, change in bowel habits, loss of appetite, bloody stools.   Resp:  No chest wall deformity  Skin: no rash or lesions.  GU: no dysuria, change in color of urine, no urgency or frequency.  No flank pain, no hematuria   MS:  No joint pain or swelling.  No decreased range of motion.  No back pain.    Physical Exam  BP 120/60   Pulse 88   Temp 97.7 F (36.5 C) (Oral)   Ht 5\' 4"  (1.626 m)   Wt 241 lb 6.4 oz (109.5 kg)   SpO2 95%   BMI 41.44 kg/m   GEN: A/Ox3; pleasant , NAD, obese , chronically ill appearing with walker    HEENT:  [place]/AT,  EACs-clear, TMs-wnl, NOSE-clear, THROAT-clear, no lesions, no postnasal drip or exudate noted.   NECK:  Supple w/ fair ROM; no JVD; normal carotid impulses w/o bruits; no thyromegaly or nodules palpated; no lymphadenopathy.    RESP  Decreased BS in bases ; w/o, wheezes/ rales/ or rhonchi. no accessory muscle use, no dullness to percussion  CARD:  RRR, no m/r/g, 1+  peripheral edema, pulses intact, no cyanosis or clubbing.  GI:   Soft & nt; nml bowel sounds; no organomegaly or masses detected.   Musco: Warm bil, no deformities or joint swelling noted.   Neuro: alert, no focal deficits noted.    Skin: Warm, no lesions or rashes  Psych:  No change in mood or  affect. No depression or anxiety.  No memory loss.  Lab Results:  CBC    Component Value Date/Time   WBC 7.4 11/05/2016 1232   RBC 3.94 11/05/2016 1232   HGB 11.5 (L) 11/05/2016 1232   HCT 35.4 (L) 11/05/2016 1232   PLT 251.0 11/05/2016 1232   MCV 89.9 11/05/2016 1232   MCH 28.9 09/14/2016 1300   MCHC 32.4 11/05/2016 1232   RDW 20.0 (H) 11/05/2016 1232   LYMPHSABS 2.1 11/05/2016 1232   MONOABS 0.8 11/05/2016 1232   EOSABS 0.1 11/05/2016 1232   BASOSABS 0.0 11/05/2016 1232    BMET    Component Value Date/Time   NA 140 11/05/2016 1232   K 4.8 11/05/2016 1232   CL 110 11/05/2016 1232   CO2 22 11/05/2016 1232   GLUCOSE 90 11/05/2016 1232   BUN 19 11/05/2016 1232   CREATININE 0.94 11/05/2016 1232   CALCIUM 9.0 11/05/2016 1232   GFRNONAA 55 (L) 09/14/2016 1300   GFRAA >60 09/14/2016 1300    BNP    Component Value Date/Time   BNP 781.7 (H) 09/14/2016 1300    ProBNP    Component Value Date/Time   PROBNP 358.0 (H) 11/05/2016 1232    Imaging: Dg Chest 2 View  Result Date: 11/05/2016 CLINICAL DATA:  CHF, evaluate for pneumonia EXAM: CHEST  2 VIEW COMPARISON:  10/30/2016 and 09/26/2016 FINDINGS: Mild residual right upper lobe opacity, likely post infectious/inflammatory scarring. Residual pneumonia is difficult to exclude. Left lung is essentially clear, noting underlying chronic interstitial markings. No pleural effusion or pneumothorax. The heart is normal in size. IMPRESSION: Mild residual right upper lobe opacity, likely post infectious/inflammatory scarring. Residual pneumonia is difficult to exclude. Electronically Signed   By: Julian Hy M.D.   On: 11/05/2016 12:50   Dg Chest 2 View  Result Date: 10/30/2016 CLINICAL DATA:  Wheezing. EXAM: CHEST  2 VIEW COMPARISON:  09/26/2016 .  09/14/2016.  09/02/2015. FINDINGS: Mediastinum hilar structures normal. Previously identified infiltrate right upper lobe has almost completely cleared. Mild residual. Stable  bilateral chronic interstitial prominence noted. Heart size normal. No pleural effusion pneumothorax. IMPRESSION: Near complete clearing  of right upper lobe infiltrate. Mild residual. Chronic interstitial lung disease. Electronically Signed   By: Marcello Moores  Register   On: 10/30/2016 12:26     Assessment & Plan:   OSA (obstructive sleep apnea) Patient has underlying severe obstructive sleep apnea. ? OHS component  C Pap titration study is pending  Will begin therapy as soon as completed.    PNA (pneumonia) Recent severe pneumonia with possible hypersensitivity pneumonitis Patient is slowly improving clinically. X-ray does show gradual improvement in the right upper lobe consolidation He does have some underlying interstitial changes. Have her return for pulmonary function test and a chest x-ray in the distal arm and if she needs further testing with a possible underlying CT chest high resolution  Diastolic dysfunction Recent decompensated diastolic heart failure. Patient's continue a low-salt diet. Continue on her diuretic therapy and follow with cardiology as planned.     Rexene Edison, NP 11/13/2016

## 2016-11-15 NOTE — Patient Outreach (Signed)
[facility]) Care Management  2020-06-2516  QUIYANA VAQUERANO Oct 15, 1941 [id number]   Erroneous encounter. Please disregard.   Jacqulynn Cadet  [facility] Care Management Assistant

## 2016-11-20 DIAGNOSIS — G4733 Obstructive sleep apnea (adult) (pediatric): Secondary | ICD-10-CM | POA: Diagnosis not present

## 2016-11-20 DIAGNOSIS — K219 Gastro-esophageal reflux disease without esophagitis: Secondary | ICD-10-CM | POA: Diagnosis not present

## 2016-11-20 DIAGNOSIS — J449 Chronic obstructive pulmonary disease, unspecified: Secondary | ICD-10-CM | POA: Diagnosis not present

## 2016-11-20 DIAGNOSIS — K588 Other irritable bowel syndrome: Secondary | ICD-10-CM | POA: Diagnosis not present

## 2016-11-20 DIAGNOSIS — E119 Type 2 diabetes mellitus without complications: Secondary | ICD-10-CM | POA: Diagnosis not present

## 2016-11-20 DIAGNOSIS — Z7984 Long term (current) use of oral hypoglycemic drugs: Secondary | ICD-10-CM

## 2016-11-20 DIAGNOSIS — M1990 Unspecified osteoarthritis, unspecified site: Secondary | ICD-10-CM | POA: Diagnosis not present

## 2016-11-20 DIAGNOSIS — G4700 Insomnia, unspecified: Secondary | ICD-10-CM | POA: Diagnosis not present

## 2016-11-20 DIAGNOSIS — F418 Other specified anxiety disorders: Secondary | ICD-10-CM | POA: Diagnosis not present

## 2016-11-20 DIAGNOSIS — R279 Unspecified lack of coordination: Secondary | ICD-10-CM | POA: Diagnosis not present

## 2016-11-20 DIAGNOSIS — I509 Heart failure, unspecified: Secondary | ICD-10-CM | POA: Diagnosis not present

## 2016-11-20 DIAGNOSIS — Z79891 Long term (current) use of opiate analgesic: Secondary | ICD-10-CM

## 2016-11-20 DIAGNOSIS — I110 Hypertensive heart disease with heart failure: Secondary | ICD-10-CM | POA: Diagnosis not present

## 2016-11-20 DIAGNOSIS — E785 Hyperlipidemia, unspecified: Secondary | ICD-10-CM | POA: Diagnosis not present

## 2016-11-20 DIAGNOSIS — Z7982 Long term (current) use of aspirin: Secondary | ICD-10-CM

## 2016-11-28 ENCOUNTER — Telehealth: Payer: Self-pay | Admitting: Medical

## 2016-11-28 ENCOUNTER — Telehealth: Payer: Self-pay | Admitting: Family Medicine

## 2016-11-28 NOTE — Telephone Encounter (Signed)
Patient request call back if Stephanie Mckenzie respond to her request.

## 2016-11-28 NOTE — Telephone Encounter (Signed)
Relation to [id number] Call back number: [phone]    Reason for call:   Patient requesting a refill traMADol (ULTRAM) 50 MG tablet due to leg pain and states pharmacy has sent multiple request, please advise

## 2016-11-28 NOTE — Telephone Encounter (Signed)
Lmom machine for patient to contact provider who last filled rx or she would need to come in for an office visit.  Called [facility] and they stated that they had a Dr. Ishmael Holter??? That had filled it on 10/16/16.   Medication listed in medlist with historical provider.

## 2016-11-28 NOTE — Telephone Encounter (Signed)
Will you pass pt request for tramadol to Dr. Etter Sjogren. I have seen her for other problems but not sure about her pain med request. So will you run this by Dr. Etter Sjogren. Thanks.

## 2016-11-28 NOTE — Telephone Encounter (Signed)
Patient called back and stated that she is out of medication and that she needs her tramadol.  She has been in a nursing home and meds have been filled by different doctors.  It was taken off our list here.  I advised her that she may need to come in and she stated that she cannot afford to come in.  I also advised that I would send message to Dr. Etter Sjogren but she is not here and she wanted me to send the request to [facility].     Percell Miller can you help on this matter please?

## 2016-11-29 MED ORDER — TRAMADOL HCL 50 MG PO TABS: 50.0000 mg | ORAL_TABLET | Freq: Four times a day (QID) | ORAL | 0 refills | Status: AC | PRN

## 2016-11-29 NOTE — Telephone Encounter (Signed)
If she is still in [facility] --- dr there would give it If she is home I need d/c summary from [facility]

## 2016-11-29 NOTE — Telephone Encounter (Signed)
Awaiting d/c summary from [facility].  Patient notified that is what we are waiting on.

## 2016-11-29 NOTE — Telephone Encounter (Signed)
Dr. Etter Sjogren can you help with this since you are back.

## 2016-11-29 NOTE — Telephone Encounter (Signed)
Last fill was from Dr. Valora Piccolo at [facility] at [place] [phone]) per [place].    Rx called into [facility] but she will need and OV before any more refills. Left detailed message on patients machine.

## 2016-11-29 NOTE — Telephone Encounter (Signed)
See other phone message  

## 2016-11-29 NOTE — Addendum Note (Signed)
Addended by: Kem Boroughs D on: 11/29/2016 04:08 PM   Modules accepted: Orders

## 2016-11-30 ENCOUNTER — Other Ambulatory Visit: Payer: Self-pay | Admitting: *Deleted

## 2016-11-30 ENCOUNTER — Telehealth: Payer: Self-pay | Admitting: *Deleted

## 2016-11-30 NOTE — Telephone Encounter (Signed)
Received completed and signed H/H Certification and Plan of Care from Dr. Etter Sjogren.  Paperwork faxed to [facility] @ [phone]).  Confirmation received.  Paperwork sent for scanning.//AB/CMA

## 2016-12-04 DIAGNOSIS — G4733 Obstructive sleep apnea (adult) (pediatric): Secondary | ICD-10-CM | POA: Diagnosis not present

## 2016-12-04 DIAGNOSIS — E1165 Type 2 diabetes mellitus with hyperglycemia: Secondary | ICD-10-CM | POA: Diagnosis not present

## 2016-12-04 DIAGNOSIS — R609 Edema, unspecified: Secondary | ICD-10-CM | POA: Diagnosis not present

## 2016-12-04 DIAGNOSIS — E782 Mixed hyperlipidemia: Secondary | ICD-10-CM | POA: Diagnosis not present

## 2016-12-04 DIAGNOSIS — E663 Overweight: Secondary | ICD-10-CM | POA: Diagnosis not present

## 2016-12-04 DIAGNOSIS — R0609 Other forms of dyspnea: Secondary | ICD-10-CM | POA: Diagnosis not present

## 2016-12-04 DIAGNOSIS — I509 Heart failure, unspecified: Secondary | ICD-10-CM | POA: Diagnosis not present

## 2016-12-04 DIAGNOSIS — I2510 Atherosclerotic heart disease of native coronary artery without angina pectoris: Secondary | ICD-10-CM | POA: Diagnosis not present

## 2016-12-04 DIAGNOSIS — Z794 Long term (current) use of insulin: Secondary | ICD-10-CM | POA: Diagnosis not present

## 2016-12-04 DIAGNOSIS — I10 Essential (primary) hypertension: Secondary | ICD-10-CM | POA: Diagnosis not present

## 2016-12-04 DIAGNOSIS — R0602 Shortness of breath: Secondary | ICD-10-CM | POA: Diagnosis not present

## 2016-12-07 ENCOUNTER — Ambulatory Visit (INDEPENDENT_AMBULATORY_CARE_PROVIDER_SITE_OTHER): Payer: PPO | Admitting: Pulmonary Disease

## 2016-12-07 ENCOUNTER — Ambulatory Visit (HOSPITAL_COMMUNITY)
Admission: RE | Admit: 2016-12-07 | Discharge: 2016-12-07 | Disposition: A | Discharge status: Home / Self Care | Payer: PPO | Source: Ambulatory Visit | Attending: Adult Health | Admitting: Adult Health

## 2016-12-07 ENCOUNTER — Encounter: Payer: Self-pay | Admitting: Pulmonary Disease

## 2016-12-07 VITALS — BP 122/60 | HR 111 | Ht 64.0 in | Wt 245.2 lb

## 2016-12-07 DIAGNOSIS — R0600 Dyspnea, unspecified: Secondary | ICD-10-CM | POA: Insufficient documentation

## 2016-12-07 DIAGNOSIS — G4733 Obstructive sleep apnea (adult) (pediatric): Secondary | ICD-10-CM

## 2016-12-07 DIAGNOSIS — R918 Other nonspecific abnormal finding of lung field: Secondary | ICD-10-CM | POA: Insufficient documentation

## 2016-12-07 LAB — PULMONARY FUNCTION TEST
DL/VA % pred: 64 %
DL/VA: 3.09 ml/min/mmHg/L
DLCO unc % pred: 53 %
DLCO unc: 12.91 ml/min/mmHg
FEF 25-75 PRE: 1.65 L/s
FEF 25-75 Post: 1.71 L/sec
FEF2575-%Change-Post: 3 %
FEF2575-%Pred-Post: 99 %
FEF2575-%Pred-Pre: 95 %
FEV1-%Change-Post: 0 %
FEV1-%PRED-POST: 98 %
FEV1-%PRED-PRE: 98 %
FEV1-POST: 2.14 L
FEV1-PRE: 2.12 L
FEV1FVC-%CHANGE-POST: 8 %
FEV1FVC-%Pred-Pre: 100 %
FEV6-%CHANGE-POST: -4 %
FEV6-%PRED-POST: 94 %
FEV6-%PRED-PRE: 99 %
FEV6-PRE: 2.72 L
FEV6-Post: 2.58 L
FEV6FVC-%Change-Post: 2 %
FEV6FVC-%PRED-PRE: 101 %
FEV6FVC-%Pred-Post: 104 %
FVC-%CHANGE-POST: -7 %
FVC-%Pred-Post: 90 %
FVC-%Pred-Pre: 97 %
FVC-Post: 2.6 L
FVC-Pre: 2.81 L
POST FEV6/FVC RATIO: 99 %
Post FEV1/FVC ratio: 82 %
Pre FEV1/FVC ratio: 76 %
Pre FEV6/FVC Ratio: 97 %
RV % PRED: 86 %
RV: 1.96 L
TLC % pred: 93 %
TLC: 4.74 L

## 2016-12-07 MED ORDER — ALBUTEROL SULFATE (2.5 MG/3ML) 0.083% IN NEBU
2.5000 mg | INHALATION_SOLUTION | Freq: Once | RESPIRATORY_TRACT | Status: AC
  Administered 2016-12-07: 2.5 mg via RESPIRATORY_TRACT

## 2016-12-07 NOTE — Progress Notes (Signed)
Subjective:    Patient ID: Stephanie Mckenzie, female    DOB: Mar 08, 1941, 76 y.o.   MRN: [id number]  C.C.:  Follow-up for Lung Opacity on Imaging Study & Severe OSA.  HPI Lung Opacity on Imaging Study: Patient hospitalized in [facility] for MRSA pneumonia. 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 She reports her stamina is decreased. She reports difficulty breathing with exertion. She reports she is trying to walk to increase her endurance. She reports she had black mold in her home. She did complete her Prednisone. She denies any chronic coughing. She has had a mild, "barking" cough this week. She has had wheezing at times. She isn't using her rescue inhaler because it doesn't seem to help significantly.   Severe OSA: Patient underwent polysomnogram testing in November showing severe OSA. Referred for CPAP titration. Follows with Dr. Elsworth Soho and scheduled on 1/17.  Review of Systems She reports a "burning" in her muscles, especially in her back, with exertion. She reports this week she has noticed increasing lower extremity edema. She denies any chest tightness or pain. She does feel "heavy" in her chest. No fever or chills. Still having intermittent hot flashes and night sweats which she has attributed to menopause. She reports diffuse joint pains from her Fibromyalgia.   Allergies  Allergen Reactions  . Dilaudid [Hydromorphone] Other (See Comments)    Per daughter cause confusion and hallucinations. Causes confusion and hallucinations per pt's daughter  . Lorazepam Rash and Other (See Comments)    Other reaction(s): Hallucinations hallucinations  hallucinations  hallucinations   . Morphine And Related Itching  . Acyclovir And Related   . Amlodipine Besylate     Pedal edema  . Buprenorphine Hcl Itching    Current Outpatient Prescriptions on File Prior to Visit  Medication  Sig Dispense Refill  . acetaminophen (TYLENOL) 325 MG tablet Take 1 tablet by mouth every 4 hours as needed for pain.    Marland Kitchen albuterol (PROAIR HFA) 108 (90 Base) MCG/ACT inhaler Inhale 2 puffs into the lungs every 6 (six) hours as needed for wheezing or shortness of breath. 1 Inhaler 4  . alendronate (FOSAMAX) 70 MG tablet Take 1 tablet (70 mg total) by mouth every 7 (seven) days. Take with a full glass of water on an empty stomach. 4 tablet 11  . aspirin 81 MG tablet Take 81 mg by mouth daily.    Marland Kitchen atorvastatin (LIPITOR) 40 MG tablet TAKE ONE TABLET BY MOUTH ONCE DAILY 90 tablet 1  . Butalbital-APAP-Caffeine (FIORICET) 50-300-40 MG CAPS Take 1 capsule every 6 hours as needed oral for headache/knee pain.    Marland Kitchen CARTIA XT 180 MG 24 hr capsule TAKE ONE CAPSULE BY MOUTH ONCE DAILY 90 capsule 0  . cetirizine (ZYRTEC) 10 MG tablet Take 1 tablet (10 mg total) by mouth daily.    Marland Kitchen dicyclomine (BENTYL) 10 MG capsule Take 10 mg by mouth 3 (three) times daily.    . diphenoxylate-atropine (LOMOTIL) 2.5-0.025 MG tablet Take by mouth. Take 1 tablet by mouth 4 times a day as needed for Diarrhea.    Marland Kitchen FLUoxetine (PROZAC) 40 MG capsule Take 1 capsule by mouth daily.    . fluticasone (FLONASE) 50 MCG/ACT nasal spray Place 2 sprays into both nostrils daily. 16 g 6  . folic acid (FOLVITE) 1 MG tablet TAKE ONE TABLET BY MOUTH ONCE DAILY 30 tablet 11  . furosemide (LASIX) 20 MG  tablet TAKE ONE TABLET BY MOUTH ONCE DAILY AS NEEDED FOR  EDEMA 30 tablet 2  . furosemide (LASIX) 40 MG tablet Take 1 tablet by mouth once daily as needed for edema.    Marland Kitchen glipiZIDE (GLUCOTROL) 5 MG tablet Take 1 tablet (5 mg total) by mouth daily before breakfast.    . glucose blood test strip Check blood sugar no more than three times daily. 300 each 5  . ipratropium (ATROVENT HFA) 17 MCG/ACT inhaler Inhale 2 puffs into the lungs every 6 (six) hours as needed for wheezing. 1 Inhaler 12  . Melatonin 5 MG CAPS Take 1 capsule daily at bedtime oral.   0  . metFORMIN (GLUCOPHAGE XR) 500 MG 24 hr tablet Take 1 tablet (500 mg total) by mouth daily with breakfast. 30 tablet 3  . metolazone (ZAROXOLYN) 2.5 MG tablet Take 2.5 mg by mouth daily.    . metoprolol succinate (TOPROL-XL) 50 MG 24 hr tablet TAKE ONE TABLET BY MOUTH TWICE DAILY 60 tablet 5  . mirtazapine (REMERON) 30 MG tablet Take 30 mg by mouth at bedtime.    . NONFORMULARY OR COMPOUNDED ITEM Compression stockings 20-30 mg hg #1 pair 1 each 0  . nystatin (MYCOSTATIN/NYSTOP) 100000 UNIT/GM POWD Apply up to qid 60 g 1  . omeprazole (PRILOSEC) 40 MG capsule Take 40 mg by mouth 2 (two) times daily.    . ONE TOUCH LANCETS MISC Check blood sugar no more than three times daily 300 each 5  . oxybutynin (DITROPAN XL) 15 MG 24 hr tablet TAKE ONE TABLET BY MOUTH ONCE DAILY 30 tablet 11  . potassium chloride (K-DUR) 10 MEQ tablet TAKE ONE TABLET BY MOUTH THREE TIMES DAILY (Patient taking differently: Take 2 tablets Monday Wednesday and Friday.) 90 tablet 2  . traMADol (ULTRAM) 50 MG tablet Take 1 tablet (50 mg total) by mouth every 6 (six) hours as needed (for pain). 30 tablet 0  . Vitamin D, Ergocalciferol, (DRISDOL) 50000 units CAPS capsule Take 1 capsule (50,000 Units total) by mouth every 7 (seven) days. 6 capsule 0  . zolpidem (AMBIEN) 5 MG tablet Take 5 mg by mouth once.     No current facility-administered medications on file prior to visit.     Past Medical History:  Diagnosis Date  . CHF (congestive heart failure) ([place])   . Depression   . Edema 10/09/2015  . GERD (gastroesophageal reflux disease)   . High cholesterol   . Hypertension   . Migraine     Past Surgical History:  Procedure Laterality Date  . ABDOMINAL HYSTERECTOMY    . APPENDECTOMY    . CESAREAN SECTION     x 2  . CORONARY ANGIOPLASTY    . LAMINECTOMY    . PARATHYROIDECTOMY      Family History  Problem Relation Age of Onset  . Diabetes Mother   . Hypertension Mother   . Thyroid disease Mother   .  Pancreatitis Father   . COPD Sister   . Rheumatologic disease Neg Hx     Social History   Social History  . Marital status: Divorced    Spouse name: N/A  . Number of children: 2  . Years of education: N/A   Occupational History  . Retired    Social History Main Topics  . Smoking status: Former Smoker    Packs/day: 1.00    Years: 30.00    Types: Cigarettes    Quit date: 11/26/2012  . Smokeless tobacco: Never Used  .  Alcohol use 0.0 oz/week     Comment: occasional  . Drug use: No  . Sexual activity: Not Asked   Other Topics Concern  . None   Social History Narrative   Will Pulmonary (12/07/16):   Originally from [place], [place]. Has always lived in [place]. Previous home/residence had "black mold". No bird exposure. 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 Has also worked for a Merchandiser, retail and serving food.       Objective:   Physical Exam BP 122/60 (BP Location: Left Arm, Cuff Size: Normal)   Pulse (!) 111   Ht 5\' 4"  (1.626 m)   Wt 245 lb 3.2 oz (111.2 kg)   SpO2 96%   BMI 42.09 kg/m  General:  Awake. Alert. No acute distress. Moderate central obesity on exposed skin.  Integument:  Warm & dry. No rash on exposed skin. No bruising. Extremities:  No cyanosis or clubbing.  HEENT:  Moist mucus membranes. No oral ulcers. No scleral injection or icterus.  Cardiovascular:  Regular rate and rhythm. Pitting edema bilateral lower extremities.  Pulmonary:  Good aeration bilaterally. Very faint and minimal crackles at right lung base. Symmetric chest wall expansion. No accessory muscle use on room air. Abdomen: Soft. Normal bowel sounds. Protuberant. Musculoskeletal:  Normal bulk and tone. Hand grip strength 5/5 bilaterally. No joint effusion appreciated. Heberden's nodes noted bilaterally.  PFT 12/07/16: FVC 2.81 L (97%) FEV1 2.12 L (98%) FEV1/FVC 0.76 FEF 25-75 1.65 L (95%) negative bronchodilator response TLC 4.74 L (93%) RV 86% ERV 30%  DLCO uncorrected 53%  POLYSOMNOGRAM (10/11/16): Severe OSA with AHI 44.8 events/hour. No significant central sleep apnea. Study performed on 2 L/m with lowest saturation 88%. No cardiac abnormalities.  IMAGING CXR PA/LAT 11/05/16 (personally reviewed by me): Minimal residual right upper lobe opacity. Low lung volumes. No pleural effusion or thickening appreciated. Heart normal in size & mediastinum normal in contour.  CTA CHEST 09/18/16 (per radiologist): Suboptimal opacification distal pulmonary artery/lower order vessels. No definite filling defects within central pulmonary arteries. RV/LV ratio less than 0.9. No pericardial effusion. Heart slightly enlarged. Left atrium appears enlarged. Mild mediastinal adenopathy with pretracheal lymph node measuring up to 1 cm. Small hilar lymph nodes. No axillary adenopathy. Extensive groundglass densities are present throughout all lobes of the lung with some sparing the lower lobes and right middle lobe. No large pleural effusion.  CARDIAC TTE (09/15/16): LV not well visualized. Probable overall grossly normal in size and function with mild LVH and visually estimated EF 50-55%. Mild left atrial enlargement & normal right atrium. Normal right ventricular structure and function. No aortic stenosis or regurgitation. Mild mitral regurgitation. Pulmonic valve not well visualized. No significant tricuspid regurgitation. No pericardial effusion.  TTE (07/04/16): Moderate LVH with severe concentric hypertrophy. EF 65-70% with no regional wall motion abnormality. Grade 1 diastolic dysfunction. LA & RA normal in size. RV normal in size and function. No aortic stenosis or regurgitation. Aortic root normal in size. No mitral stenosis or regurgitation. No pulmonic stenosis or regurgitation. No tricuspid regurgitation. Pulmonary artery normal in size. No pericardial effusion.  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 Patient's pulmonary function testing was reviewed with her today showing no evidence of restriction on lung volumes or obstruction on  spirometry. She does have a moderately decreased carbon dioxide diffusion capacity that is uncorrected for hemoglobin and could indicate underlying residual scar tissue from her previous pneumonitis. We did discuss high-resolution CT imaging with the patient is concerned about cost containment given her multiple medical bills. He also discussed immunization to prevent further infections but she declines at this time. We reviewed her previous chest x-ray from December which does show some residual albeit mild residual opacity in her right upper lung. I instructed the patient contact my office if she had any new breathing problems or questions before next appointment.  1. Opacity of Lung on Imaging Study:  Likely residual from previous pneumonia/hypersensitivity pneumonitis. Patient declines high-resolution CT imaging. Checking 6 minute walk test on room air at next appointment. 2. Severe OSA:  Scheduled for CPAP titration next week. Encouraged patient to keep this appointment. 3. Chronic Diastolic CHF:  Following with Cardiology. Continuing on diuretic therapy. 4. Health Maintenance:  S/P Tdap November 2013. Declines further immunizations. 5. Follow-up: Return to clinic in 6 weeks to be seen by either myself, Tammy Parrett or Dr. Elsworth Soho.  Sonia Baller Ashok Cordia, M.D. [facility] Pager:  [phone] After 3pm or if no response, call [phone] 3:39 PM 12/07/16

## 2016-12-07 NOTE — Patient Instructions (Signed)
   Keep your appointment for your CPAP titration next week.  Call our office if you have any new breathing problems before your next appointment.  We will do a walking test when we see you back.  TESTS ORDERED: 1. 6MWT on room air at follow-up appointment

## 2016-12-12 ENCOUNTER — Encounter (HOSPITAL_BASED_OUTPATIENT_CLINIC_OR_DEPARTMENT_OTHER): Payer: Self-pay

## 2016-12-14 ENCOUNTER — Other Ambulatory Visit: Payer: Self-pay | Admitting: Family Medicine

## 2016-12-17 ENCOUNTER — Other Ambulatory Visit: Payer: Self-pay | Admitting: Medical

## 2016-12-17 DIAGNOSIS — F331 Major depressive disorder, recurrent, moderate: Secondary | ICD-10-CM | POA: Diagnosis not present

## 2016-12-17 DIAGNOSIS — F4323 Adjustment disorder with mixed anxiety and depressed mood: Secondary | ICD-10-CM | POA: Diagnosis not present

## 2016-12-18 DIAGNOSIS — R601 Generalized edema: Secondary | ICD-10-CM | POA: Diagnosis not present

## 2016-12-18 DIAGNOSIS — G4733 Obstructive sleep apnea (adult) (pediatric): Secondary | ICD-10-CM | POA: Diagnosis not present

## 2016-12-18 DIAGNOSIS — I10 Essential (primary) hypertension: Secondary | ICD-10-CM | POA: Diagnosis not present

## 2016-12-18 DIAGNOSIS — I2510 Atherosclerotic heart disease of native coronary artery without angina pectoris: Secondary | ICD-10-CM | POA: Diagnosis not present

## 2016-12-18 DIAGNOSIS — E782 Mixed hyperlipidemia: Secondary | ICD-10-CM | POA: Diagnosis not present

## 2016-12-18 DIAGNOSIS — J441 Chronic obstructive pulmonary disease with (acute) exacerbation: Secondary | ICD-10-CM | POA: Diagnosis not present

## 2016-12-18 DIAGNOSIS — R0602 Shortness of breath: Secondary | ICD-10-CM | POA: Diagnosis not present

## 2017-01-15 DIAGNOSIS — G4733 Obstructive sleep apnea (adult) (pediatric): Secondary | ICD-10-CM | POA: Diagnosis not present

## 2017-01-15 DIAGNOSIS — E663 Overweight: Secondary | ICD-10-CM | POA: Diagnosis not present

## 2017-01-15 DIAGNOSIS — R0609 Other forms of dyspnea: Secondary | ICD-10-CM | POA: Diagnosis not present

## 2017-01-15 DIAGNOSIS — N183 Chronic kidney disease, stage 3 (moderate): Secondary | ICD-10-CM | POA: Diagnosis not present

## 2017-01-15 DIAGNOSIS — I2510 Atherosclerotic heart disease of native coronary artery without angina pectoris: Secondary | ICD-10-CM | POA: Diagnosis not present

## 2017-01-15 DIAGNOSIS — I10 Essential (primary) hypertension: Secondary | ICD-10-CM | POA: Diagnosis not present

## 2017-01-15 DIAGNOSIS — R609 Edema, unspecified: Secondary | ICD-10-CM | POA: Diagnosis not present

## 2017-01-15 DIAGNOSIS — E1165 Type 2 diabetes mellitus with hyperglycemia: Secondary | ICD-10-CM | POA: Diagnosis not present

## 2017-01-15 DIAGNOSIS — Z794 Long term (current) use of insulin: Secondary | ICD-10-CM | POA: Diagnosis not present

## 2017-01-18 ENCOUNTER — Ambulatory Visit: Payer: Self-pay | Admitting: Pulmonary Disease

## 2017-01-22 ENCOUNTER — Other Ambulatory Visit: Payer: Self-pay | Admitting: Family Medicine

## 2017-01-22 DIAGNOSIS — IMO0002 Reserved for concepts with insufficient information to code with codable children: Secondary | ICD-10-CM

## 2017-01-22 DIAGNOSIS — E785 Hyperlipidemia, unspecified: Secondary | ICD-10-CM

## 2017-01-22 DIAGNOSIS — E1151 Type 2 diabetes mellitus with diabetic peripheral angiopathy without gangrene: Secondary | ICD-10-CM

## 2017-01-22 DIAGNOSIS — E1165 Type 2 diabetes mellitus with hyperglycemia: Secondary | ICD-10-CM

## 2017-01-24 DIAGNOSIS — I2510 Atherosclerotic heart disease of native coronary artery without angina pectoris: Secondary | ICD-10-CM | POA: Diagnosis not present

## 2017-01-28 ENCOUNTER — Ambulatory Visit: Payer: Self-pay | Admitting: Pulmonary Disease

## 2017-01-28 ENCOUNTER — Ambulatory Visit: Payer: Self-pay

## 2017-02-01 DIAGNOSIS — I2510 Atherosclerotic heart disease of native coronary artery without angina pectoris: Secondary | ICD-10-CM | POA: Diagnosis not present

## 2017-02-01 DIAGNOSIS — I10 Essential (primary) hypertension: Secondary | ICD-10-CM | POA: Diagnosis not present

## 2017-02-01 DIAGNOSIS — E782 Mixed hyperlipidemia: Secondary | ICD-10-CM | POA: Diagnosis not present

## 2017-02-01 DIAGNOSIS — R609 Edema, unspecified: Secondary | ICD-10-CM | POA: Diagnosis not present

## 2017-02-01 DIAGNOSIS — Z9111 Patient's noncompliance with dietary regimen: Secondary | ICD-10-CM | POA: Diagnosis not present

## 2017-02-01 DIAGNOSIS — G4733 Obstructive sleep apnea (adult) (pediatric): Secondary | ICD-10-CM | POA: Diagnosis not present

## 2017-02-01 DIAGNOSIS — R0609 Other forms of dyspnea: Secondary | ICD-10-CM | POA: Diagnosis not present

## 2017-02-01 DIAGNOSIS — N183 Chronic kidney disease, stage 3 (moderate): Secondary | ICD-10-CM | POA: Diagnosis not present

## 2017-02-06 DIAGNOSIS — I2510 Atherosclerotic heart disease of native coronary artery without angina pectoris: Secondary | ICD-10-CM | POA: Diagnosis not present

## 2017-02-07 ENCOUNTER — Other Ambulatory Visit: Payer: Self-pay | Admitting: Family Medicine

## 2017-02-07 MED ORDER — CETIRIZINE HCL 10 MG PO TABS: 10.0000 mg | ORAL_TABLET | Freq: Every day | ORAL | 6 refills | Status: DC

## 2017-02-12 ENCOUNTER — Ambulatory Visit: Payer: Self-pay | Admitting: Neurology

## 2017-02-12 DIAGNOSIS — Z029 Encounter for administrative examinations, unspecified: Secondary | ICD-10-CM

## 2017-02-13 ENCOUNTER — Encounter: Payer: Self-pay | Admitting: Neurology

## 2017-02-28 DIAGNOSIS — R0602 Shortness of breath: Secondary | ICD-10-CM | POA: Diagnosis not present

## 2017-02-28 DIAGNOSIS — G4733 Obstructive sleep apnea (adult) (pediatric): Secondary | ICD-10-CM | POA: Diagnosis not present

## 2017-02-28 DIAGNOSIS — N183 Chronic kidney disease, stage 3 (moderate): Secondary | ICD-10-CM | POA: Diagnosis not present

## 2017-02-28 DIAGNOSIS — I10 Essential (primary) hypertension: Secondary | ICD-10-CM | POA: Diagnosis not present

## 2017-02-28 DIAGNOSIS — R609 Edema, unspecified: Secondary | ICD-10-CM | POA: Diagnosis not present

## 2017-02-28 DIAGNOSIS — E782 Mixed hyperlipidemia: Secondary | ICD-10-CM | POA: Diagnosis not present

## 2017-02-28 DIAGNOSIS — I2510 Atherosclerotic heart disease of native coronary artery without angina pectoris: Secondary | ICD-10-CM | POA: Diagnosis not present

## 2017-02-28 DIAGNOSIS — R0609 Other forms of dyspnea: Secondary | ICD-10-CM | POA: Diagnosis not present

## 2017-03-01 IMAGING — DX DG CHEST 2V
2 series · 2 of 2 positions shown · non-contrast
Comparison: 09/26/2016 .  09/14/2016.  09/02/2015.

CLINICAL DATA: Wheezing.

EXAM:
CHEST  2 VIEW

[chest pa]
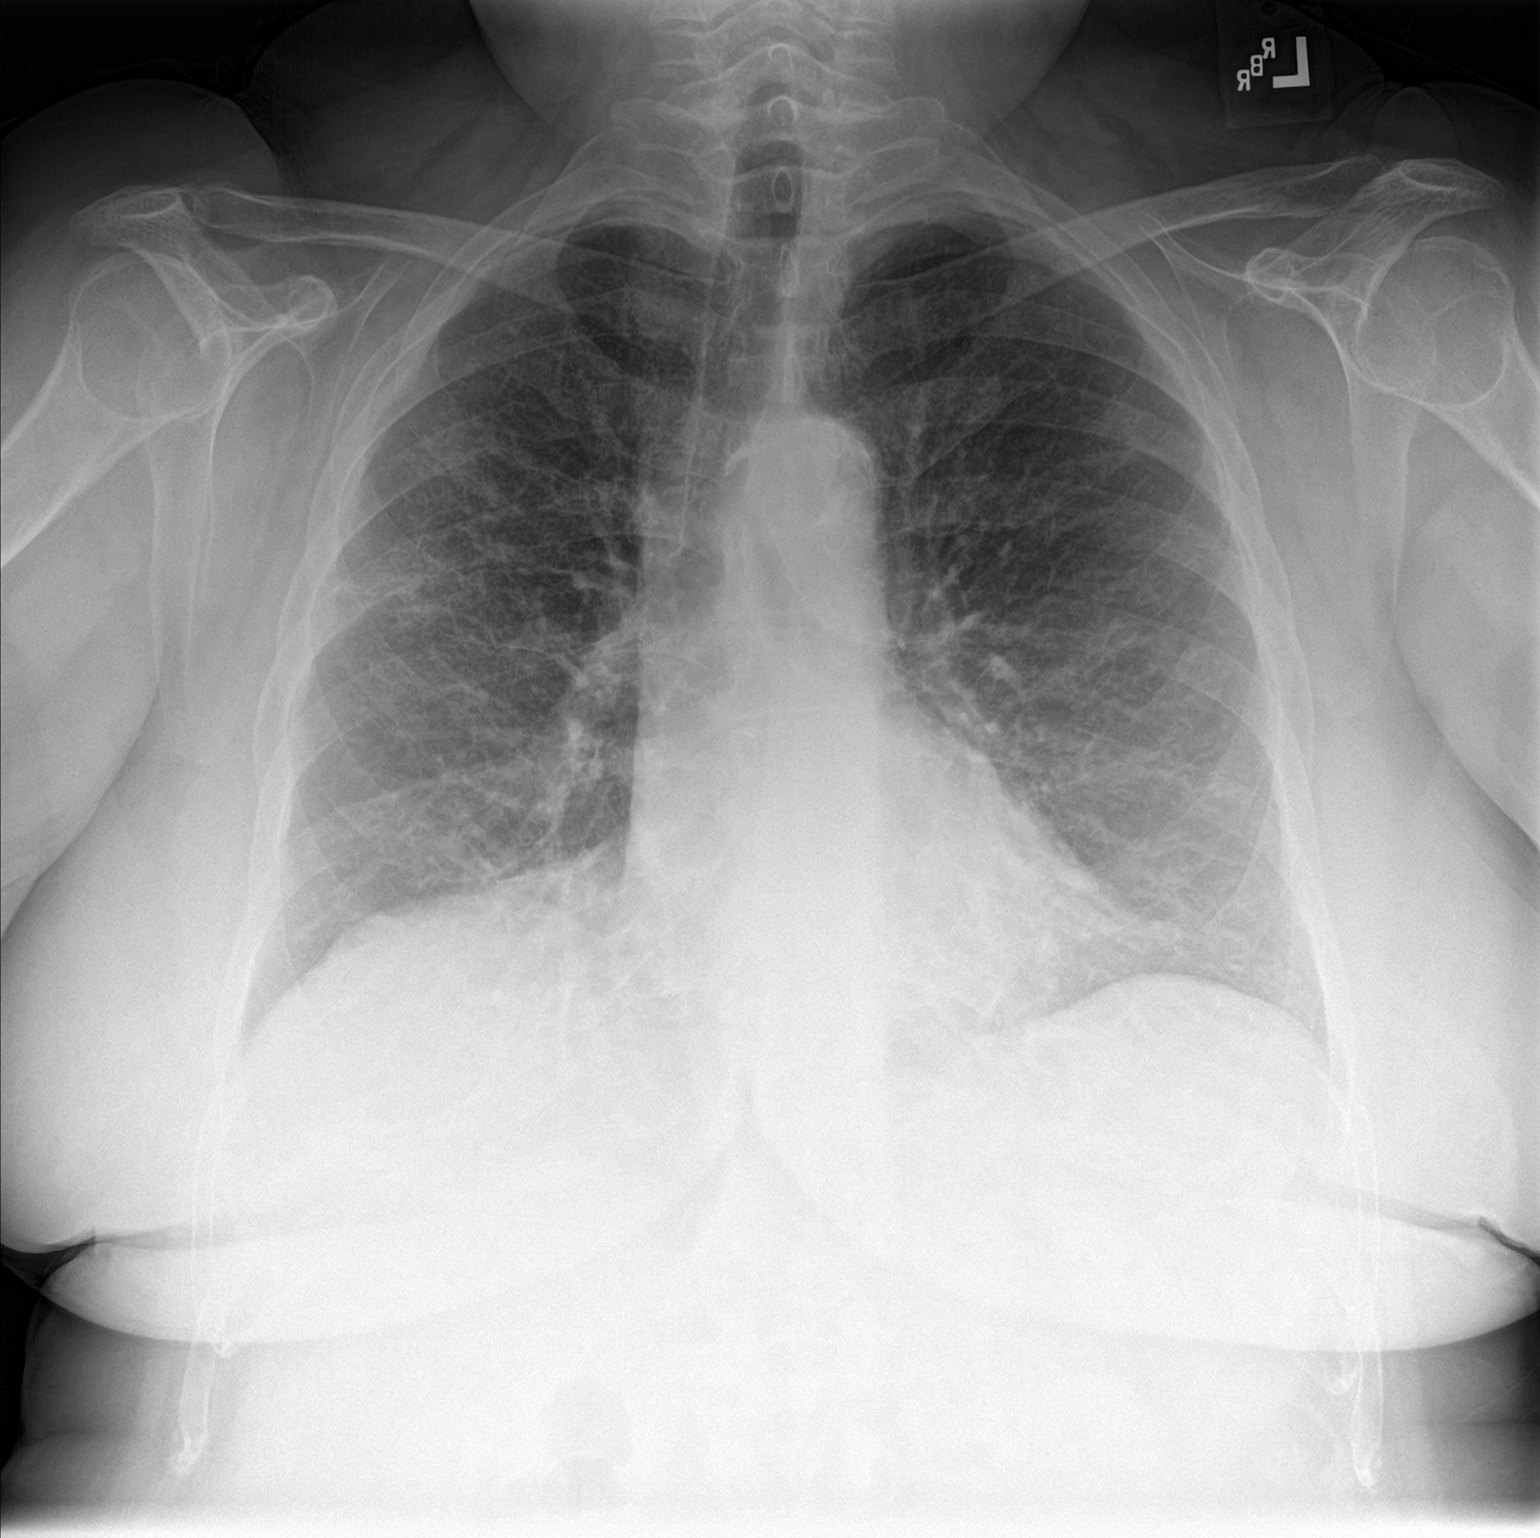

[chest lat]
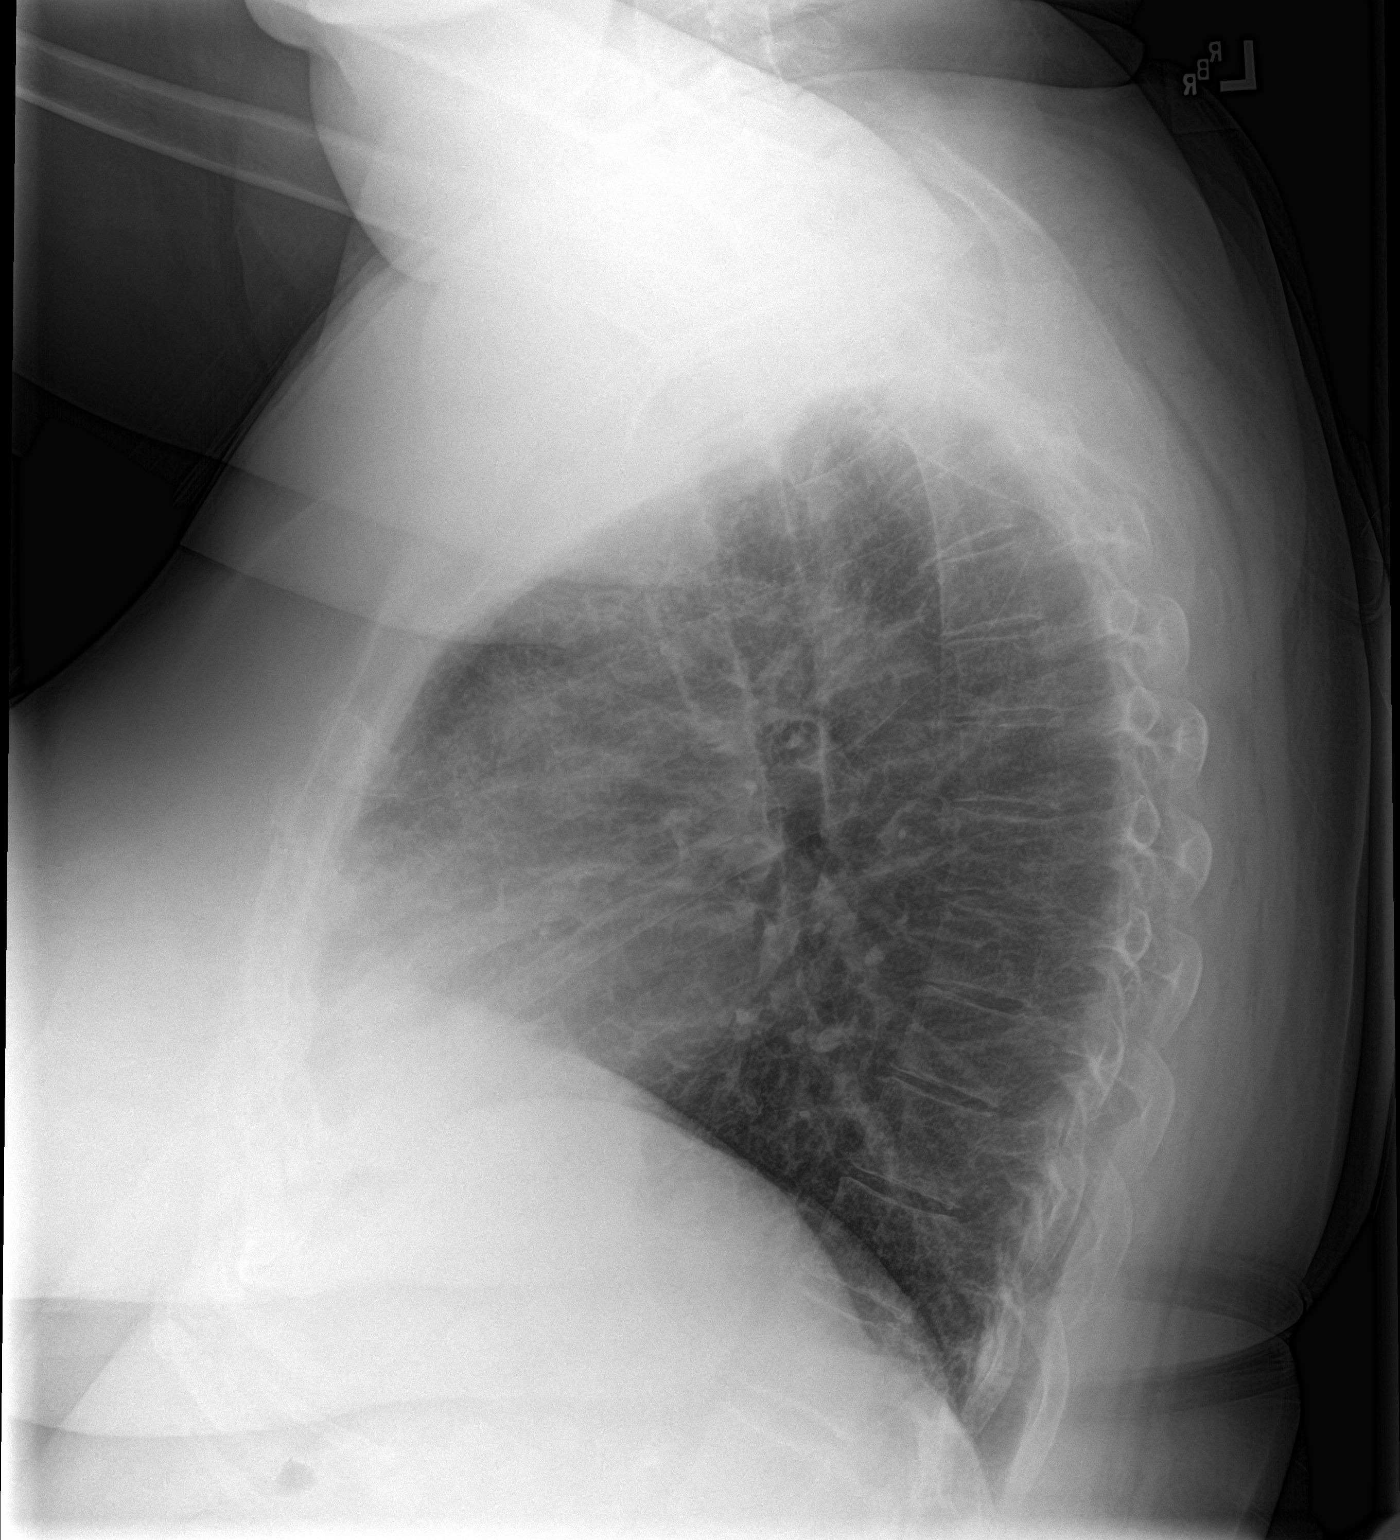

[2 of 2 positions shown; findings below may reference images not displayed]

FINDINGS: Mediastinum hilar structures normal. Previously identified
infiltrate right upper lobe has almost completely cleared. Mild
residual. Stable bilateral chronic interstitial prominence noted.
Heart size normal. No pleural effusion pneumothorax.
IMPRESSION: Near complete clearing of right upper lobe infiltrate. Mild
residual. Chronic interstitial lung disease.

## 2017-03-05 ENCOUNTER — Encounter: Payer: Self-pay | Admitting: Family Medicine

## 2017-03-05 ENCOUNTER — Ambulatory Visit (INDEPENDENT_AMBULATORY_CARE_PROVIDER_SITE_OTHER): Payer: PPO | Admitting: Family Medicine

## 2017-03-05 VITALS — BP 128/60 | HR 80 | Temp 97.3°F | Resp 16 | Ht 64.0 in | Wt 246.0 lb

## 2017-03-05 DIAGNOSIS — M25522 Pain in left elbow: Secondary | ICD-10-CM | POA: Diagnosis not present

## 2017-03-05 DIAGNOSIS — L659 Nonscarring hair loss, unspecified: Secondary | ICD-10-CM | POA: Diagnosis not present

## 2017-03-05 DIAGNOSIS — G4733 Obstructive sleep apnea (adult) (pediatric): Secondary | ICD-10-CM

## 2017-03-05 DIAGNOSIS — E1149 Type 2 diabetes mellitus with other diabetic neurological complication: Secondary | ICD-10-CM | POA: Diagnosis not present

## 2017-03-05 NOTE — Patient Instructions (Addendum)
Ask psychiatrist about Seroquel.    Obesity, Adult Obesity is the condition of having too much total body fat. Being overweight or obese means that your weight is greater than what is considered healthy for your body size. Obesity is determined by a measurement called BMI. BMI is an estimate of body fat and is calculated from height and weight. For adults, a BMI of 30 or higher is considered obese. Obesity can eventually lead to other health concerns and major illnesses, including:  Stroke.  Coronary artery disease (CAD).  Type 2 diabetes.  Some types of cancer, including cancers of the colon, breast, uterus, and gallbladder.  Osteoarthritis.  High blood pressure (hypertension).  High cholesterol.  Sleep apnea.  Gallbladder stones.  Infertility problems. What are the causes? The main cause of obesity is taking in (consuming) more calories than your body uses for energy. Other factors that contribute to this condition may include:  Being born with genes that make you more likely to become obese.  Having a medical condition that causes obesity. These conditions include:  Hypothyroidism.  Polycystic ovarian syndrome (PCOS).  Binge-eating disorder.  Cushing syndrome.  Taking certain medicines, such as steroids, antidepressants, and seizure medicines.  Not being physically active (sedentary lifestyle).  Living where there are limited places to exercise safely or buy healthy foods.  Not getting enough sleep. What increases the risk? The following factors may increase your risk of this condition:  Having a family history of obesity.  Being a woman of African-American descent.  Being a man of Hispanic descent. What are the signs or symptoms? Having excessive body fat is the main symptom of this condition. How is this diagnosed? This condition may be diagnosed based on:  Your symptoms.  Your medical history.  A physical exam. Your health care provider may  measure:  Your BMI. If you are an adult with a BMI between 25 and less than 30, you are considered overweight. If you are an adult with a BMI of 30 or higher, you are considered obese.  The distances around your hips and your waist (circumferences). These may be compared to each other to help diagnose your condition.  Your skinfold thickness. Your health care provider may gently pinch a fold of your skin and measure it. How is this treated? Treatment for this condition often includes changing your lifestyle. Treatment may include some or all of the following:  Dietary changes. Work with your health care provider and a dietitian to set a weight-loss goal that is healthy and reasonable for you. Dietary changes may include eating:  Smaller portions. A portion size is the amount of a particular food that is healthy for you to eat at one time. This varies from person to person.  Low-calorie or low-fat options.  More whole grains, fruits, and vegetables.  Regular physical activity. This may include aerobic activity (cardio) and strength training.  Medicine to help you lose weight. Your health care provider may prescribe medicine if you are unable to lose 1 pound a week after 6 weeks of eating more healthily and doing more physical activity.  Surgery. Surgical options may include gastric banding and gastric bypass. Surgery may be done if:  Other treatments have not helped to improve your condition.  You have a BMI of 40 or higher.  You have life-threatening health problems related to obesity. Follow these instructions at home:   Eating and drinking    Follow recommendations from your health care provider about what you eat  and drink. Your health care provider may advise you to:  Limit fast foods, sweets, and processed snack foods.  Choose low-fat options, such as low-fat milk instead of whole milk.  Eat 5 or more servings of fruits or vegetables every day.  Eat at home more often.  This gives you more control over what you eat.  Choose healthy foods when you eat out.  Learn what a healthy portion size is.  Keep low-fat snacks on hand.  Avoid sugary drinks, such as soda, fruit juice, iced tea sweetened with sugar, and flavored milk.  Eat a healthy breakfast.  Drink enough water to keep your urine clear or pale yellow.  Do not go without eating for long periods of time (do not fast) or follow a fad diet. Fasting and fad diets can be unhealthy and even dangerous. Physical Activity   Exercise regularly, as told by your health care provider. Ask your health care provider what types of exercise are safe for you and how often you should exercise.  Warm up and stretch before being active.  Cool down and stretch after being active.  Rest between periods of activity. Lifestyle   Limit the time that you spend in front of your TV, computer, or video game system.  Find ways to reward yourself that do not involve food.  Limit alcohol intake to no more than 1 drink a day for nonpregnant women and 2 drinks a day for men. One drink equals 12 oz of beer, 5 oz of wine, or 1 oz of hard liquor. General instructions   Keep a weight loss journal to keep track of the food you eat and how much you exercise you get.  Take over-the-counter and prescription medicines only as told by your health care provider.  Take vitamins and supplements only as told by your health care provider.  Consider joining a support group. Your health care provider may be able to recommend a support group.  Keep all follow-up visits as told by your health care provider. This is important. Contact a health care provider if:  You are unable to meet your weight loss goal after 6 weeks of dietary and lifestyle changes. This information is not intended to replace advice given to you by your health care provider. Make sure you discuss any questions you have with your health care provider. Document  Released: 12/20/2004 Document Revised: 04/16/2016 Document Reviewed: 08/31/2015 Elsevier Interactive Patient Education  2017 [employer].

## 2017-03-05 NOTE — Progress Notes (Signed)
Patient ID: Stephanie Mckenzie, female   DOB: 1941-07-31, 76 y.o.   MRN: [id number]     Subjective:    Patient ID: Stephanie Mckenzie, female    DOB: 1940/12/30, 76 y.o.   MRN: [id number]  Chief Complaint  Patient presents with  . Leg Pain    both legs    HPI  Patient is in today for pain all over body.  Both legs are swollen.  Patient was seen by cardiology 02/28/17 and has been taking care of this.  She is very tearful and she feels very depressed.  Her hair loss has increased. It comes out by the hand full.   She does not have the desire to do anything.  Has numbness in her left foot.  She has had left elbow pain.  Tramadol with no relief.   Pt crying about her family-- she feels ignored but she she does not do anything to help herself.  She does not follow instructions and is noncompliant with meds and other instructions given to pt.  Pt was in office for greater thatn 40 min.    Patient Care Team: Ann Held, DO as PCP - General (Family Medicine) Sherryl Barters, MD as Referring Physician (Cardiology) Vladimir Crofts, MD (Gastroenterology) Paschal Dopp. Quay Burow, MD (Endocrinology) Cameron Sprang, MD as Consulting Physician (Neurology) Vernell Morgans, MD as Referring Physician (Psychiatry)   Past Medical History:  Diagnosis Date  . CHF (congestive heart failure) ([place])   . Depression   . Edema 10/09/2015  . GERD (gastroesophageal reflux disease)   . High cholesterol   . Hypertension   . Migraine     Past Surgical History:  Procedure Laterality Date  . ABDOMINAL HYSTERECTOMY    . APPENDECTOMY    . CESAREAN SECTION     x 2  . CORONARY ANGIOPLASTY    . LAMINECTOMY    . PARATHYROIDECTOMY      Family History  Problem Relation Age of Onset  . Diabetes Mother   . Hypertension Mother   . Thyroid disease Mother   . Pancreatitis Father   . COPD Sister   . Rheumatologic disease Neg Hx     Social History   Social History  . Marital status: Divorced   Spouse name: N/A  . Number of children: 2  . Years of education: N/A   Occupational History  . Retired    Social History Main Topics  . Smoking status: Former Smoker    Packs/day: 1.00    Years: 30.00    Types: Cigarettes    Quit date: 11/26/2012  . Smokeless tobacco: Never Used  . Alcohol use 0.0 oz/week     Comment: occasional  . Drug use: No  . Sexual activity: Not on file   Other Topics Concern  . Not on file   Social History Narrative   [facility] (12/07/16):   Originally from [place], [place]. Has always lived in [place]. Previous home/residence had "black mold". No bird exposure. 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 Has also worked for a Merchandiser, retail and serving food.     Outpatient Medications Prior to Visit  Medication Sig Dispense Refill  . alendronate (FOSAMAX) 70 MG tablet Take 1 tablet (70 mg total) by mouth every 7 (seven) days. Take with a full glass of water on an empty stomach. 4 tablet 11  . aspirin 81 MG tablet Take 81 mg by mouth daily.    Marland Kitchen  atorvastatin (LIPITOR) 40 MG tablet TAKE ONE TABLET BY MOUTH ONCE DAILY 90 tablet 1  . Butalbital-APAP-Caffeine (FIORICET) 50-300-40 MG CAPS Take 1 capsule every 6 hours as needed oral for headache/knee pain.    Marland Kitchen CARTIA XT 180 MG 24 hr capsule TAKE ONE CAPSULE BY MOUTH ONCE DAILY 90 capsule 0  . cetirizine (ZYRTEC) 10 MG tablet Take 1 tablet (10 mg total) by mouth daily. 30 tablet 6  . diphenoxylate-atropine (LOMOTIL) 2.5-0.025 MG tablet Take by mouth. Take 1 tablet by mouth 4 times a day as needed for Diarrhea.    Marland Kitchen FLUoxetine (PROZAC) 40 MG capsule Take 1 capsule by mouth daily.    . folic acid (FOLVITE) 1 MG tablet TAKE ONE TABLET BY MOUTH ONCE DAILY 30 tablet 11  . furosemide (LASIX) 20 MG tablet TAKE ONE TABLET BY MOUTH ONCE DAILY AS NEEDED FOR  EDEMA 30 tablet 2  . glipiZIDE (GLUCOTROL) 5 MG tablet TAKE ONE TABLET BY MOUTH ONCE DAILY BEFORE BREAKFAST 30 tablet 5  . glucose  blood test strip Check blood sugar no more than three times daily. 300 each 5  . metFORMIN (GLUCOPHAGE-XR) 500 MG 24 hr tablet TAKE ONE TABLET BY MOUTH ONCE DAILY WITH BREAKFAST 90 tablet 0  . metolazone (ZAROXOLYN) 2.5 MG tablet Take 2.5 mg by mouth daily.    . metoprolol succinate (TOPROL-XL) 50 MG 24 hr tablet TAKE ONE TABLET BY MOUTH TWICE DAILY 60 tablet 5  . mirtazapine (REMERON) 30 MG tablet Take 30 mg by mouth at bedtime.    . NONFORMULARY OR COMPOUNDED ITEM Compression stockings 20-30 mg hg #1 pair 1 each 0  . omeprazole (PRILOSEC) 40 MG capsule TAKE ONE CAPSULE BY MOUTH TWICE DAILY 180 capsule 0  . ONE TOUCH LANCETS MISC Check blood sugar no more than three times daily 300 each 5  . oxybutynin (DITROPAN XL) 15 MG 24 hr tablet TAKE ONE TABLET BY MOUTH ONCE DAILY 30 tablet 11  . potassium chloride (K-DUR) 10 MEQ tablet TAKE ONE TABLET BY MOUTH THREE TIMES DAILY (Patient taking differently: Take 2 tablets Monday Wednesday and Friday.) 90 tablet 2  . traMADol (ULTRAM) 50 MG tablet Take 1 tablet (50 mg total) by mouth every 6 (six) hours as needed (for pain). 30 tablet 0  . Vitamin D, Ergocalciferol, (DRISDOL) 50000 units CAPS capsule Take 1 capsule (50,000 Units total) by mouth every 7 (seven) days. 6 capsule 0  . zolpidem (AMBIEN) 5 MG tablet Take 5 mg by mouth once.    Marland Kitchen acetaminophen (TYLENOL) 325 MG tablet Take 1 tablet by mouth every 4 hours as needed for pain.    Marland Kitchen dicyclomine (BENTYL) 10 MG capsule Take 10 mg by mouth 3 (three) times daily.    . fluticasone (FLONASE) 50 MCG/ACT nasal spray Place 2 sprays into both nostrils daily. (Patient not taking: Reported on 03/05/2017) 16 g 6  . furosemide (LASIX) 40 MG tablet Take 1 tablet by mouth once daily as needed for edema.    Marland Kitchen ipratropium (ATROVENT HFA) 17 MCG/ACT inhaler Inhale 2 puffs into the lungs every 6 (six) hours as needed for wheezing. (Patient not taking: Reported on 03/05/2017) 1 Inhaler 12  . nystatin (MYCOSTATIN/NYSTOP) 100000  UNIT/GM POWD Apply up to qid 60 g 1  . albuterol (PROAIR HFA) 108 (90 Base) MCG/ACT inhaler Inhale 2 puffs into the lungs every 6 (six) hours as needed for wheezing or shortness of breath. 1 Inhaler 4  . glipiZIDE (GLUCOTROL) 5 MG tablet Take 1 tablet (  5 mg total) by mouth daily before breakfast.    . Melatonin 5 MG CAPS Take 1 capsule daily at bedtime oral.  0   No facility-administered medications prior to visit.     Allergies  Allergen Reactions  . Dilaudid [Hydromorphone] Other (See Comments)    Per daughter cause confusion and hallucinations. Causes confusion and hallucinations per pt's daughter  . Lorazepam Rash and Other (See Comments)    Other reaction(s): Hallucinations hallucinations  hallucinations  hallucinations   . Morphine And Related Itching  . Acyclovir And Related   . Amlodipine Besylate     Pedal edema  . Buprenorphine Hcl Itching    Review of Systems  Constitutional: Negative for fever and malaise/fatigue.  HENT: Negative for congestion.   Eyes: Negative for blurred vision.  Respiratory: Positive for shortness of breath. Negative for cough.   Cardiovascular: Positive for leg swelling. Negative for chest pain and palpitations.  Gastrointestinal: Negative for vomiting.  Musculoskeletal: Negative for back pain.  Skin: Negative for rash.  Neurological: Negative for loss of consciousness and headaches.       Objective:    Physical Exam  Constitutional: She is oriented to person, place, and time. She appears well-developed and well-nourished.  HENT:  Head: Normocephalic and atraumatic.  Eyes: Conjunctivae and EOM are normal.  Neck: Normal range of motion. Neck supple. No JVD present. Carotid bruit is not present. No thyromegaly present.  Cardiovascular: Normal rate, regular rhythm and normal heart sounds.   No murmur heard. Pulmonary/Chest: Effort normal and breath sounds normal. No respiratory distress. She has no wheezes. She has no rales. She exhibits  no tenderness.  Musculoskeletal: She exhibits edema.  Neurological: She is alert and oriented to person, place, and time.  Psychiatric: She has a normal mood and affect.  Nursing note and vitals reviewed.   BP 128/60 (BP Location: Left Arm, Cuff Size: Large)   Pulse 80   Temp 97.3 F (36.3 C) (Oral)   Resp 16   Ht 5\' 4"  (1.626 m)   Wt 246 lb (111.6 kg)   SpO2 97%   BMI 42.23 kg/m  Wt Readings from Last 3 Encounters:  03/07/17 246 lb (111.6 kg)  03/05/17 246 lb (111.6 kg)  12/07/16 245 lb 3.2 oz (111.2 kg)   BP Readings from Last 3 Encounters:  03/07/17 (!) 144/74  03/05/17 128/60  12/07/16 122/60     Immunization History  Administered Date(s) Administered  . Tdap 10/23/2012    Health Maintenance  Topic Date Due  . FOOT EXAM  06/25/1953  . OPHTHALMOLOGY EXAM  06/25/1953  . PNA vac Low Risk Adult (2 of 2 - PPSV23) 06/16/2016  . HEMOGLOBIN A1C  05/02/2017  . URINE MICROALBUMIN  06/21/2017  . INFLUENZA VACCINE  06/26/2017  . MAMMOGRAM  01/22/2018  . TETANUS/TDAP  10/23/2022  . COLONOSCOPY  09/15/2024  . DEXA SCAN  Completed    Lab Results  Component Value Date   WBC 7.4 11/05/2016   HGB 11.5 (L) 11/05/2016   HCT 35.4 (L) 11/05/2016   PLT 251.0 11/05/2016   GLUCOSE 90 11/05/2016   CHOL 139 03/07/2017   TRIG 196.0 (H) 03/07/2017   HDL 31.20 (L) 03/07/2017   LDLDIRECT 83.0 01/26/2016   LDLCALC 69 03/07/2017   ALT 15 11/05/2016   AST 14 11/05/2016   NA 140 11/05/2016   K 4.8 11/05/2016   CL 110 11/05/2016   CREATININE 0.94 11/05/2016   BUN 19 11/05/2016   CO2 22 11/05/2016  TSH 2.73 03/07/2017   INR 0.88 12/27/2009   HGBA1C 7.5 (H) 11/01/2016   MICROALBUR <0.7 06/21/2016    Lab Results  Component Value Date   TSH 2.73 03/07/2017   Lab Results  Component Value Date   WBC 7.4 11/05/2016   HGB 11.5 (L) 11/05/2016   HCT 35.4 (L) 11/05/2016   MCV 89.9 11/05/2016   PLT 251.0 11/05/2016   Lab Results  Component Value Date   NA 140 11/05/2016    K 4.8 11/05/2016   CO2 22 11/05/2016   GLUCOSE 90 11/05/2016   BUN 19 11/05/2016   CREATININE 0.94 11/05/2016   BILITOT 0.3 11/05/2016   ALKPHOS 79 11/05/2016   AST 14 11/05/2016   ALT 15 11/05/2016   PROT 6.3 11/05/2016   ALBUMIN 3.7 11/05/2016   CALCIUM 9.0 11/05/2016   ANIONGAP 8 09/14/2016   GFR 61.98 11/05/2016   Lab Results  Component Value Date   CHOL 139 03/07/2017   Lab Results  Component Value Date   HDL 31.20 (L) 03/07/2017   Lab Results  Component Value Date   LDLCALC 69 03/07/2017   Lab Results  Component Value Date   TRIG 196.0 (H) 03/07/2017   Lab Results  Component Value Date   CHOLHDL 4 03/07/2017   Lab Results  Component Value Date   HGBA1C 7.5 (H) 11/01/2016         Assessment & Plan:   Problem List Items Addressed This Visit      Unprioritized   OSA (obstructive sleep apnea) - Primary   Relevant Orders   Ambulatory referral to Pulmonology   Left elbow pain   Relevant Orders   Ambulatory referral to Sports Medicine   Morbid obesity ([place])    Pt requested a referral to get help with weight loss Pt given info for 2 different place to possibly go depending on cost       Relevant Orders   Amb ref to Medical Nutrition Therapy-MNT   Type 2 diabetes mellitus with neurological complications ([place])    Pt needs new referral to neuro       Relevant Orders   Amb ref to Medical Nutrition Therapy-MNT   Ambulatory referral to Neurology   Lipid panel (Completed)    Other Visit Diagnoses    Other diabetic neurological complication associated with type 2 diabetes mellitus ([place])       Relevant Orders   Ambulatory referral to Neurology   Hair loss       Relevant Orders   TSH (Completed)    pt has not been compliant with meds-- her family put her in an assisted living and she is very angry about this.  Pt has a new psych and she was encouraged to make sure she keeps those appointments Pt here > 40 min with > 50%   I have discontinued Ms.  Djordjevic's albuterol and Melatonin. I am also having her maintain her FLUoxetine, aspirin, zolpidem, mirtazapine, nystatin, fluticasone, glucose blood, ONE TOUCH LANCETS, alendronate, CARTIA XT, furosemide, NONFORMULARY OR COMPOUNDED ITEM, folic acid, oxybutynin, metoprolol succinate, Vitamin D (Ergocalciferol), potassium chloride, ipratropium, dicyclomine, furosemide, diphenoxylate-atropine, metolazone, traMADol, Butalbital-APAP-Caffeine, acetaminophen, omeprazole, metFORMIN, atorvastatin, glipiZIDE, cetirizine, and torsemide.  Meds ordered this encounter  Medications  . torsemide (DEMADEX) 20 MG tablet    CMA served as scribe during this visit. History, Physical and Plan performed by medical provider. Documentation and orders reviewed and attested to.  Ann Held, DO

## 2017-03-05 NOTE — Progress Notes (Signed)
Pre visit review using our clinic review tool, if applicable. No additional management support is needed unless otherwise documented below in the visit note. 

## 2017-03-07 ENCOUNTER — Other Ambulatory Visit (INDEPENDENT_AMBULATORY_CARE_PROVIDER_SITE_OTHER): Payer: PPO

## 2017-03-07 ENCOUNTER — Encounter: Payer: Self-pay | Admitting: Family Medicine

## 2017-03-07 ENCOUNTER — Ambulatory Visit (INDEPENDENT_AMBULATORY_CARE_PROVIDER_SITE_OTHER): Payer: PPO | Admitting: Family Medicine

## 2017-03-07 DIAGNOSIS — M25511 Pain in right shoulder: Secondary | ICD-10-CM | POA: Diagnosis not present

## 2017-03-07 DIAGNOSIS — G8929 Other chronic pain: Secondary | ICD-10-CM | POA: Diagnosis not present

## 2017-03-07 DIAGNOSIS — E1149 Type 2 diabetes mellitus with other diabetic neurological complication: Secondary | ICD-10-CM | POA: Diagnosis not present

## 2017-03-07 DIAGNOSIS — L659 Nonscarring hair loss, unspecified: Secondary | ICD-10-CM

## 2017-03-07 DIAGNOSIS — M25522 Pain in left elbow: Secondary | ICD-10-CM

## 2017-03-07 IMAGING — DX DG CHEST 2V
2 series · 2 of 2 positions shown · non-contrast
Comparison: 10/30/2016 and 09/26/2016

CLINICAL DATA: CHF, evaluate for pneumonia

EXAM:
CHEST  2 VIEW

[chest pa]
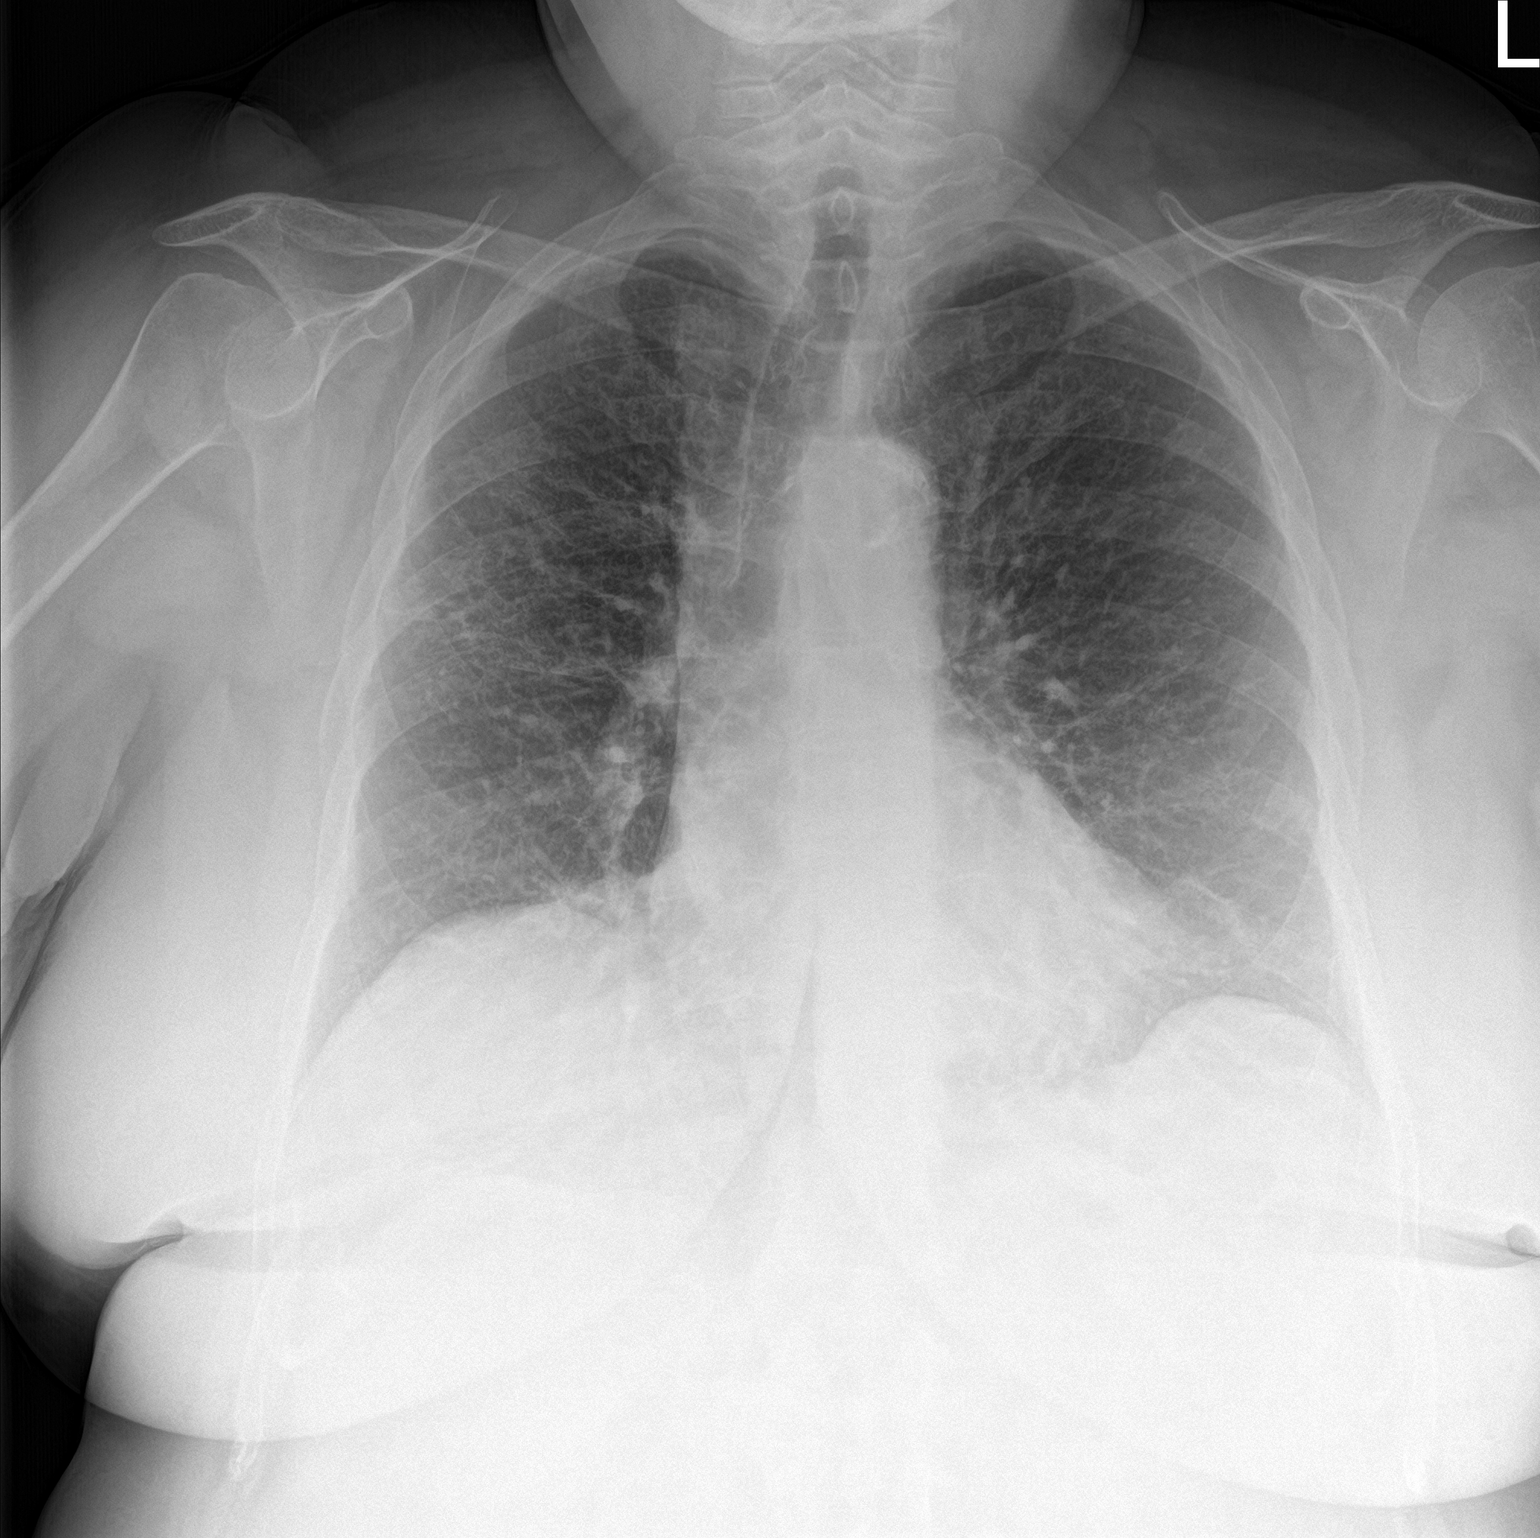

[chest lat]
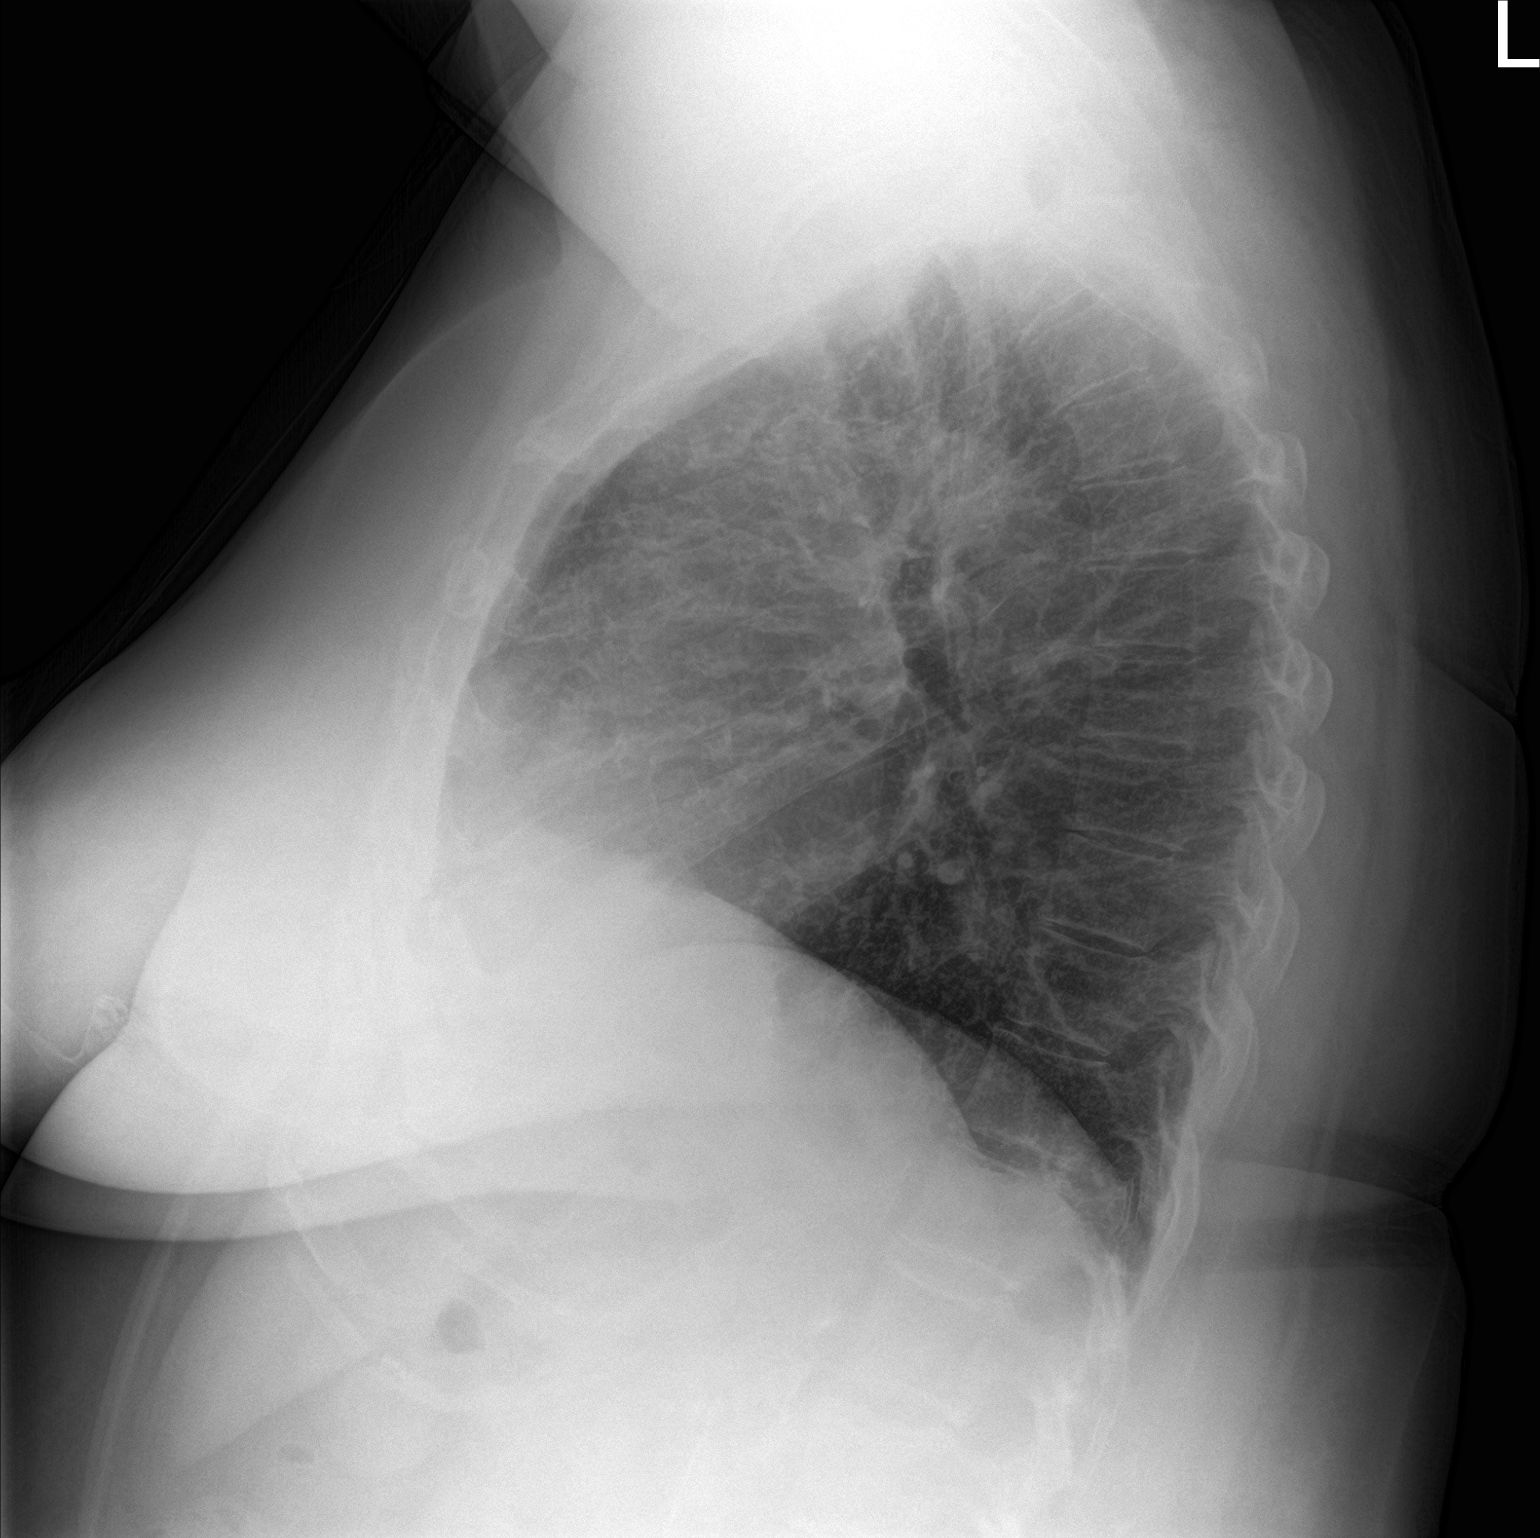

[2 of 2 positions shown; findings below may reference images not displayed]

FINDINGS: Mild residual right upper lobe opacity, likely post
infectious/inflammatory scarring. Residual pneumonia is difficult to
exclude.

Left lung is essentially clear, noting underlying chronic
interstitial markings. No pleural effusion or pneumothorax.

The heart is normal in size.
IMPRESSION: Mild residual right upper lobe opacity, likely post
infectious/inflammatory scarring. Residual pneumonia is difficult to
exclude.

## 2017-03-07 NOTE — Patient Instructions (Signed)
You have rotator cuff impingement Try to avoid painful activities (overhead activities, lifting with extended arm) as much as possible. Tylenol 500mg  1-2 tabs three times a day as needed for pain. Consider injection if not improving. Consider physical therapy with transition to home exercise program. Do home exercise program with theraband and scapular stabilization exercises daily - these are very important for long term relief.  Work your way up to 3 sets of 10 once a day but start with 1 set of 10 with yellow theraband once a day. If not improving at follow-up we will consider imaging, injection, physical therapy, and/or nitro patches.  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 Tylenol as needed for pain. Icing 3-4 times a day - careful around ulnar nerve on inside of elbow as we discussed. Strengthening with 1 pound weight pronation/supination, wrist flexion, stretching exercises (hold stretches 20-30 seconds, exercises 15 of each - do 3 sets of stretches/exercises). Sleeve or counterforce brace may be helpful to unload area of pain while it heals. Consider formal physical therapy if not improving with home exercises. Topical nitro an option for this if you do not improve. Typically avoid cortisone injection if possible because you're more likely to have problems in the future compared to someone who did not get the shot. Follow up with me in 5-6 weeks for reevaluation.  Capsaicin, aspercreme, salon pas or biofreeze topically up to four times a day may also help with pain. Some supplements that may help for arthritis: Boswellia extract, curcumin, pycnogenol Cortisone injections are an option.

## 2017-03-08 LAB — LIPID PANEL
CHOLESTEROL: 139 mg/dL (ref 0–200)
HDL: 31.2 mg/dL — ABNORMAL LOW (ref >39.00)
LDL Cholesterol: 69 mg/dL (ref 0–99)
NonHDL: 107.72
TRIGLYCERIDES: 196 mg/dL — AB (ref 0.0–149.0)
Total CHOL/HDL Ratio: 4
VLDL: 39.2 mg/dL (ref 0.0–40.0)

## 2017-03-08 LAB — TSH: TSH: 2.73 u[IU]/mL (ref 0.35–4.50)

## 2017-03-11 DIAGNOSIS — F4323 Adjustment disorder with mixed anxiety and depressed mood: Secondary | ICD-10-CM | POA: Diagnosis not present

## 2017-03-11 DIAGNOSIS — M25511 Pain in right shoulder: Secondary | ICD-10-CM | POA: Insufficient documentation

## 2017-03-11 DIAGNOSIS — F331 Major depressive disorder, recurrent, moderate: Secondary | ICD-10-CM | POA: Diagnosis not present

## 2017-03-11 DIAGNOSIS — M25522 Pain in left elbow: Secondary | ICD-10-CM | POA: Insufficient documentation

## 2017-03-11 NOTE — Assessment & Plan Note (Signed)
2/2 rotator cuff impingement.  Tylenol as needed.  Shown home exercises to do daily.  Consider imaging, injection, PT, nitro patches if not improving.  F/u in 5-6 weeks for both issues.

## 2017-03-11 NOTE — Assessment & Plan Note (Signed)
2/2 medial epicondylitis.  Shown home exercises and stretches to do daily.  Compression sleeve or counterforce brace.  Tylenol, icing if needed for pain.  Consider nitro, physical therapy, injection if not improving.

## 2017-03-11 NOTE — Progress Notes (Signed)
PCP and consultation requested by: Ann Held, DO  Subjective:   HPI: Patient is a 76 y.o. female here for left elbow, right shoulder pain.  Patient reports she has had several months of medial left elbow pain. No acute injury or trauma. Radiates into her hand and forearm. Difficulty making a fist. Has tried myoflex. Pain level 6/10 and sharp. Also with right shoulder soreness laterally for months. Pain at 5/10 level. Has not tried anything for this. No skin changes, numbness.  Past Medical History:  Diagnosis Date  . CHF (congestive heart failure) ([place])   . Depression   . Edema 10/09/2015  . GERD (gastroesophageal reflux disease)   . High cholesterol   . Hypertension   . Migraine     Current Outpatient Prescriptions on File Prior to Visit  Medication Sig Dispense Refill  . acetaminophen (TYLENOL) 325 MG tablet Take 1 tablet by mouth every 4 hours as needed for pain.    Marland Kitchen alendronate (FOSAMAX) 70 MG tablet Take 1 tablet (70 mg total) by mouth every 7 (seven) days. Take with a full glass of water on an empty stomach. 4 tablet 11  . aspirin 81 MG tablet Take 81 mg by mouth daily.    Marland Kitchen atorvastatin (LIPITOR) 40 MG tablet TAKE ONE TABLET BY MOUTH ONCE DAILY 90 tablet 1  . Butalbital-APAP-Caffeine (FIORICET) 50-300-40 MG CAPS Take 1 capsule every 6 hours as needed oral for headache/knee pain.    Marland Kitchen CARTIA XT 180 MG 24 hr capsule TAKE ONE CAPSULE BY MOUTH ONCE DAILY 90 capsule 0  . cetirizine (ZYRTEC) 10 MG tablet Take 1 tablet (10 mg total) by mouth daily. 30 tablet 6  . dicyclomine (BENTYL) 10 MG capsule Take 10 mg by mouth 3 (three) times daily.    . diphenoxylate-atropine (LOMOTIL) 2.5-0.025 MG tablet Take by mouth. Take 1 tablet by mouth 4 times a day as needed for Diarrhea.    Marland Kitchen FLUoxetine (PROZAC) 40 MG capsule Take 1 capsule by mouth daily.    . fluticasone (FLONASE) 50 MCG/ACT nasal spray Place 2 sprays into both nostrils daily. (Patient not taking: Reported on  7/82/9562) 16 g 6  . folic acid (FOLVITE) 1 MG tablet TAKE ONE TABLET BY MOUTH ONCE DAILY 30 tablet 11  . furosemide (LASIX) 20 MG tablet TAKE ONE TABLET BY MOUTH ONCE DAILY AS NEEDED FOR  EDEMA 30 tablet 2  . furosemide (LASIX) 40 MG tablet Take 1 tablet by mouth once daily as needed for edema.    Marland Kitchen glipiZIDE (GLUCOTROL) 5 MG tablet TAKE ONE TABLET BY MOUTH ONCE DAILY BEFORE BREAKFAST 30 tablet 5  . glucose blood test strip Check blood sugar no more than three times daily. 300 each 5  . ipratropium (ATROVENT HFA) 17 MCG/ACT inhaler Inhale 2 puffs into the lungs every 6 (six) hours as needed for wheezing. (Patient not taking: Reported on 03/05/2017) 1 Inhaler 12  . metFORMIN (GLUCOPHAGE-XR) 500 MG 24 hr tablet TAKE ONE TABLET BY MOUTH ONCE DAILY WITH BREAKFAST 90 tablet 0  . metolazone (ZAROXOLYN) 2.5 MG tablet Take 2.5 mg by mouth daily.    . metoprolol succinate (TOPROL-XL) 50 MG 24 hr tablet TAKE ONE TABLET BY MOUTH TWICE DAILY 60 tablet 5  . mirtazapine (REMERON) 30 MG tablet Take 30 mg by mouth at bedtime.    . NONFORMULARY OR COMPOUNDED ITEM Compression stockings 20-30 mg hg #1 pair 1 each 0  . nystatin (MYCOSTATIN/NYSTOP) 100000 UNIT/GM POWD Apply up to qid 60  g 1  . omeprazole (PRILOSEC) 40 MG capsule TAKE ONE CAPSULE BY MOUTH TWICE DAILY 180 capsule 0  . ONE TOUCH LANCETS MISC Check blood sugar no more than three times daily 300 each 5  . oxybutynin (DITROPAN XL) 15 MG 24 hr tablet TAKE ONE TABLET BY MOUTH ONCE DAILY 30 tablet 11  . potassium chloride (K-DUR) 10 MEQ tablet TAKE ONE TABLET BY MOUTH THREE TIMES DAILY (Patient taking differently: Take 2 tablets Monday Wednesday and Friday.) 90 tablet 2  . torsemide (DEMADEX) 20 MG tablet     . traMADol (ULTRAM) 50 MG tablet Take 1 tablet (50 mg total) by mouth every 6 (six) hours as needed (for pain). 30 tablet 0  . Vitamin D, Ergocalciferol, (DRISDOL) 50000 units CAPS capsule Take 1 capsule (50,000 Units total) by mouth every 7 (seven) days.  6 capsule 0  . zolpidem (AMBIEN) 5 MG tablet Take 5 mg by mouth once.     No current facility-administered medications on file prior to visit.     Past Surgical History:  Procedure Laterality Date  . ABDOMINAL HYSTERECTOMY    . APPENDECTOMY    . CESAREAN SECTION     x 2  . CORONARY ANGIOPLASTY    . LAMINECTOMY    . PARATHYROIDECTOMY      Allergies  Allergen Reactions  . Dilaudid [Hydromorphone] Other (See Comments)    Per daughter cause confusion and hallucinations. Causes confusion and hallucinations per pt's daughter  . Lorazepam Rash and Other (See Comments)    Other reaction(s): Hallucinations hallucinations  hallucinations  hallucinations   . Morphine And Related Itching  . Acyclovir And Related   . Amlodipine Besylate     Pedal edema  . Buprenorphine Hcl Itching    Social History   Social History  . Marital status: Divorced    Spouse name: N/A  . Number of children: 2  . Years of education: N/A   Occupational History  . Retired    Social History Main Topics  . Smoking status: Former Smoker    Packs/day: 1.00    Years: 30.00    Types: Cigarettes    Quit date: 11/26/2012  . Smokeless tobacco: Never Used  . Alcohol use 0.0 oz/week     Comment: occasional  . Drug use: No  . Sexual activity: Not on file   Other Topics Concern  . Not on file   Social History Narrative   [facility] (12/07/16):   Originally from [place], [place]. Has always lived in [place]. Previous home/residence had "black mold". No bird exposure. 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 Has also worked for a Merchandiser, retail and serving food.     Family History  Problem Relation Age of Onset  . Diabetes Mother   . Hypertension Mother   . Thyroid disease Mother   . Pancreatitis Father   . COPD Sister   . Rheumatologic disease Neg Hx     BP (!) 144/74   Pulse 76   Ht 5\' 4"  (1.626 m)   Wt 246 lb (111.6 kg)   BMI 42.23 kg/m   Review of  Systems: See HPI above.     Objective:  Physical Exam:  Gen: NAD, comfortable in exam room  Left elbow: No gross deformity, swelling, bruising. TTP medial epicondyle and just distal to this. FROM elbow without pain. Collateral ligaments intact. Pain on resisted wrist flexion and finger flexion. Sensation intact to light touch. NVI  distally.  Right elbow: FROM without pain.  Right shoulder: No swelling, ecchymoses.  No gross deformity. No TTP. FROM with painful arc. Positive Hawkins, negative Neers. Negative Yergasons. Strength 5/5 with empty can and resisted internal/external rotation.  Pain empty can and ER. NV intact distally.   Assessment & Plan:  1. Left elbow pain - 2/2 medial epicondylitis.  Shown home exercises and stretches to do daily.  Compression sleeve or counterforce brace.  Tylenol, icing if needed for pain.  Consider nitro, physical therapy, injection if not improving.  2. Right shoulder pain - 2/2 rotator cuff impingement.  Tylenol as needed.  Shown home exercises to do daily.  Consider imaging, injection, PT, nitro patches if not improving.  F/u in 5-6 weeks for both issues.

## 2017-03-12 ENCOUNTER — Encounter: Payer: Self-pay | Admitting: Neurology

## 2017-03-17 DIAGNOSIS — E1149 Type 2 diabetes mellitus with other diabetic neurological complication: Secondary | ICD-10-CM | POA: Insufficient documentation

## 2017-03-17 NOTE — Assessment & Plan Note (Signed)
Pt requested a referral to get help with weight loss Pt given info for 2 different place to possibly go depending on cost

## 2017-03-17 NOTE — Assessment & Plan Note (Signed)
Pt needs new referral to neuro

## 2017-03-22 DIAGNOSIS — F331 Major depressive disorder, recurrent, moderate: Secondary | ICD-10-CM | POA: Diagnosis not present

## 2017-03-26 ENCOUNTER — Encounter: Payer: Self-pay | Admitting: Pulmonary Disease

## 2017-03-26 ENCOUNTER — Ambulatory Visit (INDEPENDENT_AMBULATORY_CARE_PROVIDER_SITE_OTHER): Payer: PPO | Admitting: Pulmonary Disease

## 2017-03-26 ENCOUNTER — Ambulatory Visit (INDEPENDENT_AMBULATORY_CARE_PROVIDER_SITE_OTHER)
Admission: RE | Admit: 2017-03-26 | Discharge: 2017-03-26 | Disposition: A | Discharge status: Home / Self Care | Payer: PPO | Source: Ambulatory Visit | Attending: Pulmonary Disease | Admitting: Pulmonary Disease

## 2017-03-26 VITALS — BP 120/72 | HR 73 | Ht 64.0 in | Wt 246.0 lb

## 2017-03-26 DIAGNOSIS — G4733 Obstructive sleep apnea (adult) (pediatric): Secondary | ICD-10-CM

## 2017-03-26 DIAGNOSIS — J679 Hypersensitivity pneumonitis due to unspecified organic dust: Secondary | ICD-10-CM

## 2017-03-26 DIAGNOSIS — J189 Pneumonia, unspecified organism: Secondary | ICD-10-CM

## 2017-03-26 NOTE — Addendum Note (Signed)
Addended by: Valerie Salts on: 03/26/2017 05:14 PM   Modules accepted: Orders

## 2017-03-26 NOTE — Assessment & Plan Note (Signed)
Repeat chest x-ray today I'm doubtful of the diagnosis of  Hypersensitivity pneumonitis, if persistent infiltrates will need HRCT to clarify

## 2017-03-26 NOTE — Progress Notes (Signed)
   Subjective:    Patient ID: Stephanie Mckenzie, female    DOB: [birth year], 76 y.o.   MRN: [id number]  HPI  Chief Complaint  Patient presents with  . Follow-up    for OSA   She was evaluated in 08/2016 and PSG showed severe OSA with AHI 45/hour. Before she could be set up with CPAP, she was hospitalized to [facility] with MRSA pneumonia with CT chest showing extensive bilateral ground glass infiltrates. 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 I have reviewed [place] records and imaging studies and PFTs.  Unfortunately she had to move to a  Retirement home in sell her own house, she feels that her children dumped her there. She is tearful today due to this transition. She is trying to adjust but is obviously depressed, her fibromyalgia is worse and she complains of daily fatigue She missed her CPAP titration study appointment due to bad weathe  She did not desaturate on walking 3 laps around the office today She is compliant with her Lasix and   Significant tests/ events  PSG 09/2016 AHI at [place] 08/2016  - Extensive groundglass bilateral predominantly upper lobes  PFT 11/2016: FVC 2.81 L (97%) FEV1 2.12 L (98%) FEV1/FVC 0.76 FEF 25-75 1.65 L (95%) negative bronchodilator response TLC 4.74 L (93%) RV 86% ERV 30% DLCO uncorrected 53%  Past Medical History:  Diagnosis Date  . CHF (congestive heart failure) ([place])   . Depression   . Edema 10/09/2015  . GERD (gastroesophageal reflux disease)   . High cholesterol   . Hypertension   . Migraine     Review of Systems neg for any significant sore throat, dysphagia, itching, sneezing, nasal congestion or excess/ purulent secretions, fever, chills, sweats, unintended wt loss, pleuritic or exertional cp, hempoptysis, orthopnea pnd or change in chronic leg swelling. Also denies presyncope, palpitations, heartburn, abdominal pain, nausea, vomiting, diarrhea  or change in bowel or urinary habits, dysuria,hematuria, rash, arthralgias, visual complaints, headache, numbness weakness or ataxia.     Objective:   Physical Exam  Gen. Pleasant, obese, in no distress, normal affect ENT - no lesions, no post nasal drip, class 2-3 airway Neck: No JVD, no thyromegaly, no carotid bruits Lungs: no use of accessory muscles, no dullness to percussion, decreased without rales or rhonchi  Cardiovascular: Rhythm regular, heart sounds  normal, no murmurs or gallops, 1+ peripheral edema Abdomen: soft and non-tender, no hepatosplenomegaly, BS normal. Musculoskeletal: No deformities, no cyanosis or clubbing Neuro:  alert, non focal, no tremors       Assessment & Plan:

## 2017-03-26 NOTE — Patient Instructions (Signed)
CXR today Rx for CPAP will be sent to DME

## 2017-03-26 NOTE — Assessment & Plan Note (Signed)
She does have severe OSA and her symptoms of fatigue and daytime  Somnolence are likely related, although fibromyalgia can mimic. We'll trial of auto CPAP 5-12 cm with nasal pillows reviewed download and adjust as needed  Weight loss encouraged, compliance with goal of at least 4-6 hrs every night is the expectation. Advised against medications with sedative side effects Cautioned against driving when sleepy - understanding that sleepiness will vary on a day to day basis

## 2017-03-27 ENCOUNTER — Encounter: Payer: Self-pay | Admitting: Family Medicine

## 2017-04-04 ENCOUNTER — Other Ambulatory Visit: Payer: Self-pay | Admitting: Family Medicine

## 2017-04-04 DIAGNOSIS — B354 Tinea corporis: Secondary | ICD-10-CM

## 2017-04-05 ENCOUNTER — Ambulatory Visit (INDEPENDENT_AMBULATORY_CARE_PROVIDER_SITE_OTHER): Payer: PPO | Admitting: Neurology

## 2017-04-05 ENCOUNTER — Telehealth: Payer: Self-pay

## 2017-04-05 ENCOUNTER — Encounter: Payer: Self-pay | Admitting: Neurology

## 2017-04-05 VITALS — HR 75 | Ht 64.0 in | Wt 242.0 lb

## 2017-04-05 DIAGNOSIS — G894 Chronic pain syndrome: Secondary | ICD-10-CM

## 2017-04-05 DIAGNOSIS — G629 Polyneuropathy, unspecified: Secondary | ICD-10-CM

## 2017-04-05 NOTE — Telephone Encounter (Signed)
Left message with [facility] to schedule ABI ultrasound.

## 2017-04-05 NOTE — Patient Instructions (Addendum)
1. Schedule EMG/NCV of both lower extremities 2. Schedule ankle-brachial index (ABI) 3. Refer to Pain management 4. Refer to Nutrition for weight loss 5. Follow-up in 4 months, call for any changes  Neuropathic Pain Neuropathic pain is pain caused by damage to the nerves that are responsible for certain sensations in your body (sensory nerves). The pain can be caused by damage to:  The sensory nerves that send signals to your spinal cord and brain (peripheral nervous system).  The sensory nerves in your brain or spinal cord (central nervous system). Neuropathic pain can make you more sensitive to pain. What would be a minor sensation for most people may feel very painful if you have neuropathic pain. This is usually a long-term condition that can be difficult to treat. The type of pain can differ from person to person. It may start suddenly (acute), or it may develop slowly and last for a long time (chronic). Neuropathic pain may come and go as damaged nerves heal or may stay at the same level for years. It often causes emotional distress, loss of sleep, and a lower quality of life. What are the causes? The most common cause of damage to a sensory nerve is diabetes. Many other diseases and conditions can also cause neuropathic pain. Causes of neuropathic pain can be classified as:  Toxic. Many drugs and chemicals can cause toxic damage. The most common cause of toxic neuropathic pain is damage from drug treatment for cancer (chemotherapy).  Metabolic. This type of pain can happen when a disease causes imbalances that damage nerves. Diabetes is the most common of these diseases. Vitamin B deficiency caused by long-term alcohol abuse is another common cause.  Traumatic. Any injury that cuts, crushes, or stretches a nerve can cause damage and pain. A common example is feeling pain after losing an arm or leg (phantom limb pain).  Compression-related. If a sensory nerve gets trapped or compressed  for a long period of time, the blood supply to the nerve can be cut off.  Vascular. Many blood vessel diseases can cause neuropathic pain by decreasing blood supply and oxygen to nerves.  Autoimmune. This type of pain results from diseases in which the body's defense system mistakenly attacks sensory nerves. Examples of autoimmune diseases that can cause neuropathic pain include lupus and multiple sclerosis.  Infectious. Many types of viral infections can damage sensory nerves and cause pain. Shingles infection is a common cause of this type of pain.  Inherited. Neuropathic pain can be a symptom of many diseases that are passed down through families (genetic). What are the signs or symptoms? The main symptom is pain. Neuropathic pain is often described as:  Burning.  Shock-like.  Stinging.  Hot or cold.  Itching. How is this diagnosed? No single test can diagnose neuropathic pain. Your health care provider will do a physical exam and ask you about your pain. You may use a pain scale to describe how bad your pain is. You may also have tests to see if you have a high sensitivity to pain and to help find the cause and location of any sensory nerve damage. These tests may include:  Imaging studies, such as:  X-rays.  CT scan.  MRI.  Nerve conduction studies to test how well nerve signals travel through your sensory nerves (electrodiagnostic testing).  Stimulating your sensory nerves through electrodes on your skin and measuring the response in your spinal cord and brain (somatosensory evoked potentials). How is this treated? Treatment for neuropathic  pain may change over time. You may need to try different treatment options or a combination of treatments. Some options include:  Over-the-counter pain relievers.  Prescription medicines. Some medicines used to treat other conditions may also help neuropathic pain. These include medicines to:  Control seizures  (anticonvulsants).  Relieve depression (antidepressants).  Prescription-strength pain relievers (narcotics). These are usually used when other pain relievers do not help.  Transcutaneous nerve stimulation (TENS). This uses electrical currents to block painful nerve signals. The treatment is painless.  Topical and local anesthetics. These are medicines that numb the nerves. They can be injected as a nerve block or applied to the skin.  Alternative treatments, such as:  Acupuncture.  Meditation.  Massage.  Physical therapy.  Pain management programs.  Counseling. Follow these instructions at home:  Learn as much as you can about your condition.  Take medicines only as directed by your health care provider.  Work closely with all your health care providers to find what works best for you.  Have a good support system at home.  Consider joining a chronic pain support group. Contact a health care provider if:  Your pain treatments are not helping.  You are having side effects from your medicines.  You are struggling with fatigue, mood changes, depression, or anxiety. This information is not intended to replace advice given to you by your health care provider. Make sure you discuss any questions you have with your health care provider. Document Released: 08/09/2004 Document Revised: 06/01/2016 Document Reviewed: 04/22/2014 Elsevier Interactive Patient Education  2017 [employer].

## 2017-04-05 NOTE — Progress Notes (Signed)
NEUROLOGY FOLLOW UP OFFICE NOTE  Stephanie Mckenzie [id number]  June 01, 1971  HISTORY OF PRESENT ILLNESS: I had the pleasure of seeing Stephanie Mckenzie in follow-up in the neurology clinic on 04/05/2017.  The patient was last seen a year ago for confusion and worsening memory. MMSE in March 2017 was 29/30 (27/30 in August 2016). She reported improvement after inpatient psychiatry admission at [facility] with streamlining of her medications. She returns today because she feels like she has developed some neuropathy. She feels her health is not so good. She is having a lot of swelling and wears compression stockings. She reports gaining 100lbs since her last visit. She had been taking Lyrica which helped with her fibromyalgia, but it made her "kooky" and affected her memory. She reports that the 2nd and 3rd toes on her left foot draw up with excruciating pain. She has pain in her arm joints and became tearful. She reports numbness and tingling in her legs down to her feet. She injured her left ankle 4-6 years ago and it would occasionally get numb. Arms/hands are not affected. She denies any falls. She reports stopping her diabetes medications for 6-12 months, then restarted in October when she was admitted with pneumonia and MRSA. She is beginning to get her strength back. She had to sell her home, her children convinced her she needs to be in [place], and has been living at [facility] since February. She states she is not happy and it was really traumatic giving up all that. She feels her memory is pretty good. We discussed her medications, she takes Prozac for depression, she does not think she is taking the Remeron but does not recall what her psychiatrist Dr. Erling Cruz recently started her on.   HPI 07/25/15: This is a 76 yo RH woman with a history of hypertension, hyperlipidemia, depression, anxiety, fibromyalgia, who presented for confusion and worsening memory. She and her daughter report  confusion and memory changes became more noticeable after inpatient psychiatric admission for depression last June 2016. There was some medication changes made, she was confused about how she was supposed to take her medications. She went to her PCP office in July but apparently had no appointment and was scheduled to see psychiatry instead. She was noted to come in her pajama top inside out and backwards. Her daughter has noticed difficulties with word-finding, misplacing things at home. She had some confusion on gabapentin instructions, and her daughter had initially helped her filling her pillbox, but the patient now takes care of this herself and denies missing medications. She had a hysterectomy last November and had some confusion while on pain medications. She states that she gets confused about what she is doing, confused about the date and gets appointments mixed up. She lives by herself and denies getting lost driving. She occasionally forgets a bill payment. She easily gets distracted, loses her train of thought. Her daughter denies that she repeats herself, and denies any paranoia. Her daughter does report that her mother does not care anymore about her her hygiene and would not bathe or change clothes.   She has frontal throbbing headaches every couple of days that did improved upon stopping Lisinopril. Headaches resolve pretty quickly if she takes Advil or Excedrin. If headaches are more severe, she takes BC powder, usually taking a couple a week. She has had some nausea the past couple of weeks, independent of the headaches ("I can't find anything to eat to soothe my stomach"). She has  chronic back pain, as well as aching in her arms, shoulders, back, and legs. She has been taking Gabapentin 300mg  BID for the past couple of months for the fibromyalgia. She denies taking the Tramadol or Trazodone prescribed. She denies any dizziness, diplopia, dysarthria, dysphagia, focal numbness/tingling/weakness.  No anosmia or tremors. She had a concussion 4-5 years ago with brief loss of consciousness. She drinks alcohol occasionally. No family history of memory loss. At the end of the visit, she endorsed continued depression, she feels the Trintillex started 4 weeks ago is not helping. She became tearful relating her stress with family, that they do not love her and do not call her if they would be gone for 3 days. I spoke to her daughter after the visit, and she reports she and her brother are very involved with her care, but that her mother has let her depression for 20+years go untreated, and it has been harder to deal with this.  PAST MEDICAL HISTORY: Past Medical History:  Diagnosis Date  . CHF (congestive heart failure) ([place])   . Depression   . Edema 10/09/2015  . GERD (gastroesophageal reflux disease)   . High cholesterol   . Hypertension   . Migraine     MEDICATIONS: Current Outpatient Prescriptions on File Prior to Visit  Medication Sig Dispense Refill  . acetaminophen (TYLENOL) 325 MG tablet Take 1 tablet by mouth every 4 hours as needed for pain.    Marland Kitchen alendronate (FOSAMAX) 70 MG tablet Take 1 tablet (70 mg total) by mouth every 7 (seven) days. Take with a full glass of water on an empty stomach. 4 tablet 11  . aspirin 81 MG tablet Take 81 mg by mouth daily.    Marland Kitchen atorvastatin (LIPITOR) 40 MG tablet TAKE ONE TABLET BY MOUTH ONCE DAILY 90 tablet 1  . Butalbital-APAP-Caffeine (FIORICET) 50-300-40 MG CAPS Take 1 capsule every 6 hours as needed oral for headache/knee pain.    Marland Kitchen CARTIA XT 180 MG 24 hr capsule TAKE ONE CAPSULE BY MOUTH ONCE DAILY 90 capsule 0  . cetirizine (ZYRTEC) 10 MG tablet Take 1 tablet (10 mg total) by mouth daily. 30 tablet 6  . dicyclomine (BENTYL) 10 MG capsule Take 10 mg by mouth 3 (three) times daily.    . diphenoxylate-atropine (LOMOTIL) 2.5-0.025 MG tablet Take by mouth. Take 1 tablet by mouth 4 times a day as needed for Diarrhea.    Marland Kitchen FLUoxetine (PROZAC) 40 MG  capsule Take 1 capsule by mouth daily.    . fluticasone (FLONASE) 50 MCG/ACT nasal spray Place 2 sprays into both nostrils daily. 16 g 6  . folic acid (FOLVITE) 1 MG tablet TAKE ONE TABLET BY MOUTH ONCE DAILY 30 tablet 11  . furosemide (LASIX) 20 MG tablet TAKE ONE TABLET BY MOUTH ONCE DAILY AS NEEDED FOR  EDEMA 30 tablet 2  . furosemide (LASIX) 40 MG tablet Take 1 tablet by mouth once daily as needed for edema.    Marland Kitchen glipiZIDE (GLUCOTROL) 5 MG tablet TAKE ONE TABLET BY MOUTH ONCE DAILY BEFORE BREAKFAST 30 tablet 5  . glucose blood test strip Check blood sugar no more than three times daily. 300 each 5  . ipratropium (ATROVENT HFA) 17 MCG/ACT inhaler Inhale 2 puffs into the lungs every 6 (six) hours as needed for wheezing. 1 Inhaler 12  . metFORMIN (GLUCOPHAGE-XR) 500 MG 24 hr tablet TAKE ONE TABLET BY MOUTH ONCE DAILY WITH BREAKFAST 90 tablet 0  . metolazone (ZAROXOLYN) 2.5 MG tablet Take 2.5  mg by mouth daily.    . metoprolol succinate (TOPROL-XL) 50 MG 24 hr tablet TAKE ONE TABLET BY MOUTH TWICE DAILY 60 tablet 5  . mirtazapine (REMERON) 30 MG tablet Take 30 mg by mouth at bedtime.    . NONFORMULARY OR COMPOUNDED ITEM Compression stockings 20-30 mg hg #1 pair 1 each 0  . NYSTATIN powder APPLY  POWDER TOPICALLY UP TO 4 TIMES DAILY 56.7 g 1  . omeprazole (PRILOSEC) 40 MG capsule TAKE ONE CAPSULE BY MOUTH TWICE DAILY 180 capsule 0  . ONE TOUCH LANCETS MISC Check blood sugar no more than three times daily 300 each 5  . oxybutynin (DITROPAN XL) 15 MG 24 hr tablet TAKE ONE TABLET BY MOUTH ONCE DAILY 30 tablet 11  . potassium chloride (K-DUR) 10 MEQ tablet TAKE ONE TABLET BY MOUTH THREE TIMES DAILY (Patient taking differently: Take 2 tablets Monday Wednesday and Friday.) 90 tablet 2  . torsemide (DEMADEX) 20 MG tablet     . traMADol (ULTRAM) 50 MG tablet Take 1 tablet (50 mg total) by mouth every 6 (six) hours as needed (for pain). 30 tablet 0  . Vitamin D, Ergocalciferol, (DRISDOL) 50000 units CAPS  capsule Take 1 capsule (50,000 Units total) by mouth every 7 (seven) days. 6 capsule 0  . zolpidem (AMBIEN) 5 MG tablet Take 5 mg by mouth once.     No current facility-administered medications on file prior to visit.     ALLERGIES: Allergies  Allergen Reactions  . Dilaudid [Hydromorphone] Other (See Comments)    Per daughter cause confusion and hallucinations. Causes confusion and hallucinations per pt's daughter  . Lorazepam Rash and Other (See Comments)    Other reaction(s): Hallucinations hallucinations  hallucinations  hallucinations   . Morphine And Related Itching  . Acyclovir And Related   . Amlodipine Besylate     Pedal edema  . Buprenorphine Hcl Itching    FAMILY HISTORY: Family History  Problem Relation Age of Onset  . Diabetes Mother   . Hypertension Mother   . Thyroid disease Mother   . Pancreatitis Father   . COPD Sister   . Rheumatologic disease Neg Hx     SOCIAL HISTORY: Social History   Social History  . Marital status: Divorced    Spouse name: N/A  . Number of children: 2  . Years of education: N/A   Occupational History  . Retired    Social History Main Topics  . Smoking status: Former Smoker    Packs/day: 1.00    Years: 30.00    Types: Cigarettes    Quit date: 11/26/2012  . Smokeless tobacco: Never Used  . Alcohol use 0.0 oz/week     Comment: occasional  . Drug use: No  . Sexual activity: Not on file   Other Topics Concern  . Not on file   Social History Narrative   [facility] (12/07/16):   Originally from [place], [place]. Has always lived in [place]. Previous home/residence had "black mold". No bird exposure. 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 Has also worked for a Merchandiser, retail and serving food.     REVIEW OF SYSTEMS: Constitutional: No fevers, chills, or sweats, no generalized fatigue, change in appetite Eyes: No visual changes, double vision, eye pain Ear, nose and throat: No  hearing loss, ear pain, nasal congestion, sore throat Cardiovascular: No chest pain, palpitations Respiratory:  No shortness of breath at rest or with exertion, wheezes GastrointestinaI: No nausea,  vomiting, diarrhea, abdominal pain, fecal incontinence Genitourinary:  No dysuria, urinary retention or frequency Musculoskeletal:  + neck pain, back pain Integumentary: No rash, pruritus, skin lesions Neurological: as above Psychiatric: + depression, insomnia, anxiety Endocrine: No palpitations, fatigue, diaphoresis, mood swings, change in appetite, +change in weight Hematologic/Lymphatic:  No anemia, purpura, petechiae. Allergic/Immunologic: no itchy/runny eyes, nasal congestion, recent allergic reactions, rashes  PHYSICAL EXAM: Vitals:   04/05/17 1510  Pulse: 75   General: No acute distress, became tearful relating her health and living situation Head:  Normocephalic/atraumatic Neck: supple, no paraspinal tenderness, full range of motion Heart:  Regular rate and rhythm Lungs:  Clear to auscultation bilaterally Back: No paraspinal tenderness Skin/Extremities: No rash, no edema Neurological Exam: alert and oriented to person, place, and time. No aphasia or dysarthria. Fund of knowledge is appropriate.  Recent and remote memory are intact.  Attention and concentration are normal.    Able to name objects and repeat phrases. Cranial nerves: Pupils equal, round, reactive to light. Extraocular movements intact with no nystagmus. Visual fields full. Facial sensation intact. No facial asymmetry. Tongue, uvula, palate midline.  Motor: Bulk and tone normal, muscle strength 5/5 throughout with no pronator drift.  Sensation intact to all modalities on both upper extremities, decreased cold on both feet, intact pin, decreased vibration to ankles bilaterally. No extinction to double simultaneous stimulation.  Deep tendon reflexes +2 throughout, toes downgoing.  Finger to nose testing intact.  Gait slow and  cautious reporting hip pain and pain in her legs, no ataxia, could not tandem walk.  Romberg positive sway.  IMPRESSION: This is a 76 yo RH woman with a history of hypertension, hyperlipidemia, diabetes, depression, anxiety, fibromyalgia, who initially presented for confusion and memory changes. She presents today for evaluation of neuropathy. She feels her memory is good. 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 HbA1c in December was 7.5. EMG/NCV of both lower extremities will be ordered to further evaluate her symptoms. She is also reporting pain in both legs, schedule ABI. She is reporting a lot of pain that she feels is not being addressed, and will be referred to Pain Management. She would also like help with weight loss and will be referred to Nutrition. She will follow-up in 4 months and knows to call for any changes.  Thank you for allowing me to participate in her care.  Please do not hesitate to call for any questions or concerns.  The duration of this appointment visit was 40 minutes of face-to-face time with the patient.  Greater than 50% of this time was spent in counseling, explanation of diagnosis, planning of further management, and coordination of care.   Ellouise Newer, M.D.   CC: Dr. Etter Sjogren

## 2017-04-09 DIAGNOSIS — G629 Polyneuropathy, unspecified: Secondary | ICD-10-CM | POA: Insufficient documentation

## 2017-04-09 DIAGNOSIS — G894 Chronic pain syndrome: Secondary | ICD-10-CM | POA: Insufficient documentation

## 2017-04-16 ENCOUNTER — Ambulatory Visit (INDEPENDENT_AMBULATORY_CARE_PROVIDER_SITE_OTHER): Payer: Self-pay | Admitting: Neurology

## 2017-04-16 DIAGNOSIS — G894 Chronic pain syndrome: Secondary | ICD-10-CM

## 2017-04-16 DIAGNOSIS — G629 Polyneuropathy, unspecified: Secondary | ICD-10-CM

## 2017-04-16 NOTE — Procedures (Signed)
[facility]  [address], [place]  [place],  [address] Tel: [phone] Fax:  [phone] Test Date:  04/16/2017  Patient: Stephanie Mckenzie DOB: 12-02-1940 Physician: Narda Amber, DO  Sex: Female Height: 5\' 4"  Ref Phys: Ellouise Newer, M.D.  ID#: [id number] Temp: 33.3C Technician:    Patient Complaints: This is a 76 year-old female referred for evaluation of neuropathy involving the feet.  NCV & EMG Findings: Extensive electrodiagnostic testing of the right lower extremity and additional studies of the left shows:  1. Bilateral sural and superficial peroneal sensory responses are absent. 2. Bilateral peroneal motor responses are absent at the extensors digitorum brevis, and normal at the tibialis anterior. Bilateral tibial motor responses are within normal limits distally, however proximal response was unobtainable despite maximal stimulation due to body physiognomy.  3. Right tibial H reflex study is absent.  4. Chronic motor axon loss changes are seen affecting bilateral flexor digitorum longus and tibialis anterior muscles, without accompanied active denervation.   Impression: The electrophysiologic findings are most consistent with a symmetric and chronic sensorimotor polyneuropathy, axon loss in type, affecting the lower extremities. Overall, these findings are moderate in degree electrically.    ___________________________ Narda Amber, DO    Nerve Conduction Studies Anti Sensory Summary Table   Site NR Peak (ms) Norm Peak (ms) P-T Amp (V) Norm P-T Amp  Left Sup Peroneal Anti Sensory (Ant Lat Mall)  12 cm NR  <4.6  >3  Right Sup Peroneal Anti Sensory (Ant Lat Mall)  12 cm NR  <4.6  >3  Left Sural Anti Sensory (Lat Mall)  Calf NR  <4.6  >3  Right Sural Anti Sensory (Lat Mall)  Calf NR  <4.6  >3   Motor Summary Table   Site NR Onset (ms) Norm Onset (ms) O-P Amp (mV) Norm O-P Amp Site1 Site2 Delta-0 (ms) Dist (cm) Vel (m/s) Norm Vel (m/s)  Left  Peroneal Motor (Ext Dig Brev)  Ankle NR  <6.0  >2.5 B Fib Ankle  0.0  >40  B Fib NR     Poplt B Fib  0.0  >40  Poplt NR            Right Peroneal Motor (Ext Dig Brev)  Ankle NR  <6.0  >2.5 B Fib Ankle  0.0  >40  B Fib NR     Poplt B Fib  0.0  >40  Poplt NR            Left Peroneal TA Motor (Tib Ant)  Fib Head    4.3 <4.5 3.1 >3 Poplit Fib Head 1.3 8.0 62 >40  Poplit    5.6  3.1         Right Peroneal TA Motor (Tib Ant)  Fib Head    0.9 <4.5 3.3 >3 Poplit Fib Head 1.8 8.0 44 >40  Poplit    2.7  2.6         Left Tibial Motor (Abd Hall Brev)    body habitus behind knee  Ankle    3.8 <6.0 6.5 >4 Knee Ankle  0.0  >40  Knee NR            Right Tibial Motor (Abd Hall Brev)    body habitus behind knee  Ankle    3.1 <6.0 7.7 >4 Knee Ankle  0.0  >40  Knee NR             H Reflex Studies   NR H-Lat (ms) Lat  Norm (ms) L-R H-Lat (ms)  Right Tibial (Gastroc)  NR  <35    EMG   Side Muscle Ins Act Fibs Psw Fasc Number Recrt Dur Dur. Amp Amp. Poly Poly. Comment  Right AntTibialis Nml Nml Nml Nml 1- Rapid Some 1+ Some 1+ Nml Nml N/A  Left Gastroc Nml Nml Nml Nml Nml Nml Nml Nml Nml Nml Nml Nml N/A  Left AntTibialis Nml Nml Nml Nml 1- Rapid Some 1+ Some 1+ Nml Nml N/A  Left Flex Dig Long Nml Nml Nml Nml 1- Rapid Some 1+ Some 1+ Nml Nml N/A  Left RectFemoris Nml Nml Nml Nml Nml Nml Nml Nml Nml Nml Nml Nml N/A  Left GluteusMed Nml Nml Nml Nml Nml Nml Nml Nml Nml Nml Nml Nml N/A  Left BicepsFemS Nml Nml Nml Nml Nml Nml Nml Nml Nml Nml Nml Nml N/A  Right Gastroc Nml Nml Nml Nml Nml Nml Nml Nml Nml Nml Nml Nml N/A  Right Flex Dig Long Nml Nml Nml Nml 1- Rapid Some 1+ Some 1+ Nml Nml N/A  Right RectFemoris Nml Nml Nml Nml Nml Nml Nml Nml Nml Nml Nml Nml N/A  Right GluteusMed Nml Nml Nml Nml Nml Nml Nml Nml Nml Nml Nml Nml N/A      Waveforms:

## 2017-04-18 ENCOUNTER — Other Ambulatory Visit: Payer: Self-pay | Admitting: Neurology

## 2017-04-18 ENCOUNTER — Ambulatory Visit: Payer: Self-pay | Admitting: Family Medicine

## 2017-04-18 DIAGNOSIS — G629 Polyneuropathy, unspecified: Secondary | ICD-10-CM

## 2017-04-18 DIAGNOSIS — G894 Chronic pain syndrome: Secondary | ICD-10-CM

## 2017-04-19 ENCOUNTER — Other Ambulatory Visit: Payer: Self-pay | Admitting: Family Medicine

## 2017-04-19 DIAGNOSIS — Z1231 Encounter for screening mammogram for malignant neoplasm of breast: Secondary | ICD-10-CM

## 2017-04-22 ENCOUNTER — Other Ambulatory Visit: Payer: Self-pay | Admitting: Family Medicine

## 2017-04-26 ENCOUNTER — Telehealth: Payer: Self-pay

## 2017-04-26 NOTE — Telephone Encounter (Signed)
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 Treatment would be continued control of her sugar levels. Thanks

## 2017-04-26 NOTE — Telephone Encounter (Signed)
LMOM relaying message below.  

## 2017-05-02 ENCOUNTER — Ambulatory Visit: Payer: Self-pay

## 2017-05-03 DIAGNOSIS — Z87891 Personal history of nicotine dependence: Secondary | ICD-10-CM | POA: Diagnosis not present

## 2017-05-03 DIAGNOSIS — G4700 Insomnia, unspecified: Secondary | ICD-10-CM | POA: Diagnosis not present

## 2017-05-03 DIAGNOSIS — E785 Hyperlipidemia, unspecified: Secondary | ICD-10-CM | POA: Diagnosis not present

## 2017-05-03 DIAGNOSIS — M25551 Pain in right hip: Secondary | ICD-10-CM | POA: Diagnosis not present

## 2017-05-03 DIAGNOSIS — I10 Essential (primary) hypertension: Secondary | ICD-10-CM | POA: Diagnosis not present

## 2017-05-03 DIAGNOSIS — M549 Dorsalgia, unspecified: Secondary | ICD-10-CM | POA: Diagnosis not present

## 2017-05-03 DIAGNOSIS — Z8249 Family history of ischemic heart disease and other diseases of the circulatory system: Secondary | ICD-10-CM | POA: Diagnosis not present

## 2017-05-03 DIAGNOSIS — H9190 Unspecified hearing loss, unspecified ear: Secondary | ICD-10-CM | POA: Diagnosis not present

## 2017-05-03 DIAGNOSIS — Z885 Allergy status to narcotic agent status: Secondary | ICD-10-CM | POA: Diagnosis not present

## 2017-05-03 DIAGNOSIS — F419 Anxiety disorder, unspecified: Secondary | ICD-10-CM | POA: Diagnosis not present

## 2017-05-03 DIAGNOSIS — M461 Sacroiliitis, not elsewhere classified: Secondary | ICD-10-CM | POA: Diagnosis not present

## 2017-05-03 DIAGNOSIS — M4688 Other specified inflammatory spondylopathies, sacral and sacrococcygeal region: Secondary | ICD-10-CM | POA: Diagnosis not present

## 2017-05-03 DIAGNOSIS — I2510 Atherosclerotic heart disease of native coronary artery without angina pectoris: Secondary | ICD-10-CM | POA: Diagnosis not present

## 2017-05-03 DIAGNOSIS — F329 Major depressive disorder, single episode, unspecified: Secondary | ICD-10-CM | POA: Diagnosis not present

## 2017-05-06 ENCOUNTER — Ambulatory Visit (HOSPITAL_COMMUNITY): Payer: PPO | Attending: Cardiology

## 2017-05-07 ENCOUNTER — Encounter: Payer: Self-pay | Admitting: Family Medicine

## 2017-05-07 ENCOUNTER — Ambulatory Visit (INDEPENDENT_AMBULATORY_CARE_PROVIDER_SITE_OTHER): Payer: PPO | Admitting: Family Medicine

## 2017-05-07 ENCOUNTER — Ambulatory Visit: Payer: Self-pay | Admitting: Neurology

## 2017-05-07 VITALS — BP 142/60 | HR 70 | Temp 98.0°F | Resp 16 | Ht 64.0 in | Wt 240.6 lb

## 2017-05-07 DIAGNOSIS — M25551 Pain in right hip: Secondary | ICD-10-CM | POA: Diagnosis not present

## 2017-05-07 MED ORDER — HYDROCODONE-ACETAMINOPHEN 5-325 MG PO TABS: ORAL_TABLET | ORAL | 0 refills | Status: DC

## 2017-05-07 MED ORDER — TIZANIDINE HCL 4 MG PO TABS: 4.0000 mg | ORAL_TABLET | Freq: Four times a day (QID) | ORAL | 1 refills | Status: DC | PRN

## 2017-05-07 MED ORDER — KETOROLAC TROMETHAMINE 60 MG/2ML IM SOLN
60.0000 mg | Freq: Once | INTRAMUSCULAR | Status: AC
  Administered 2017-05-07: 60 mg via INTRAMUSCULAR

## 2017-05-07 NOTE — Assessment & Plan Note (Signed)
With arthritis on xray  Refer to ortho Short rx hydrocodone given--- no further rx will be given here toradol IM

## 2017-05-07 NOTE — Patient Instructions (Signed)
Hip Pain The hip is the joint between the upper legs and the lower pelvis. The bones, cartilage, tendons, and muscles of your hip joint support your body and allow you to move around. Hip pain can range from a minor ache to severe pain in one or both of your hips. The pain may be felt on the inside of the hip joint near the groin, or the outside near the buttocks and upper thigh. You may also have swelling or stiffness. Follow these instructions at home: Managing pain, stiffness, and swelling   If directed, apply ice to the injured area.  Put ice in a plastic bag.  Place a towel between your skin and the bag.  Leave the ice on for 20 minutes, 2-3 times a day  Sleep with a pillow between your legs on your most comfortable side.  Avoid any activities that cause pain. General instructions   Take over-the-counter and prescription medicines only as told by your health care provider.  Do any exercises as told by your health care provider.  Record the following:  How often you have hip pain.  The location of your pain.  What the pain feels like.  What makes the pain worse.  Keep all follow-up visits as told by your health care provider. This is important. Contact a health care provider if:  You cannot put weight on your leg.  Your pain or swelling continues or gets worse after one week.  It gets harder to walk.  You have a fever. Get help right away if:  You fall.  You have a sudden increase in pain and swelling in your hip.  Your hip is red or swollen or very tender to touch. Summary  Hip pain can range from a minor ache to severe pain in one or both of your hips.  The pain may be felt on the inside of the hip joint near the groin, or the outside near the buttocks and upper thigh.  Avoid any activities that cause pain.  Record how often you have hip pain, the location of the pain, what makes it worse and what it feels like. This information is not intended to  replace advice given to you by your health care provider. Make sure you discuss any questions you have with your health care provider. Document Released: 05/02/2010 Document Revised: 10/15/2016 Document Reviewed: 10/15/2016 Elsevier Interactive Patient Education  2017 Elsevier Inc.  

## 2017-05-07 NOTE — Progress Notes (Signed)
Patient ID: Stephanie Mckenzie, female   DOB: 1941/05/07, 76 y.o.   MRN: [id number]     Subjective:  I acted as a Education administrator for Dr. Carollee Mckenzie.  Stephanie Mckenzie, [place]   Patient ID: Stephanie Mckenzie, female    DOB: 1941/02/16, 76 y.o.   MRN: [id number]  Chief Complaint  Patient presents with  . Back Pain    HPI  Patient is in today for back pain.   She was seen in ED and they took XRay and they stated that she has arthritis.    Patient Care Team: Stephanie Mckenzie, Stephanie Apa, DO as PCP - General (Family Medicine) Stephanie Barters, MD as Referring Physician (Cardiology) Stephanie Cowden Marijean Heath, MD (Gastroenterology) Stephanie Mckenzie., MD (Endocrinology) Stephanie Sprang, MD as Consulting Physician (Neurology) Stephanie Mckenzie, Stephanie Coil, MD as Referring Physician (Psychiatry)   Past Medical History:  Diagnosis Date  . CHF (congestive heart failure) ([place])   . Depression   . Edema 10/09/2015  . GERD (gastroesophageal reflux disease)   . High cholesterol   . Hypertension   . Migraine     Past Surgical History:  Procedure Laterality Date  . ABDOMINAL HYSTERECTOMY    . APPENDECTOMY    . CESAREAN SECTION     x 2  . CORONARY ANGIOPLASTY    . LAMINECTOMY    . PARATHYROIDECTOMY      Family History  Problem Relation Age of Onset  . Diabetes Mother   . Hypertension Mother   . Thyroid disease Mother   . Pancreatitis Father   . COPD Sister   . Rheumatologic disease Neg Hx     Social History   Social History  . Marital status: Divorced    Spouse name: N/A  . Number of children: 2  . Years of education: N/A   Occupational History  . Retired    Social History Main Topics  . Smoking status: Former Smoker    Packs/day: 1.00    Years: 30.00    Types: Cigarettes    Quit date: 11/26/2012  . Smokeless tobacco: Never Used  . Alcohol use 0.0 oz/week     Comment: occasional  . Drug use: No  . Sexual activity: Not on file   Other Topics Concern  . Not on file   Social History Narrative   [facility] (12/07/16):   Originally from [place], [place]. Has always lived in [place]. Previous home/residence had "black mold". No bird exposure. 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 Has also worked for a Merchandiser, retail and serving food.     Outpatient Medications Prior to Visit  Medication Sig Dispense Refill  . acetaminophen (TYLENOL) 325 MG tablet Take 1 tablet by mouth every 4 hours as needed for pain.    Marland Kitchen alendronate (FOSAMAX) 70 MG tablet Take 1 tablet (70 mg total) by mouth every 7 (seven) days. Take with a full glass of water on an empty stomach. 4 tablet 11  . aspirin 81 MG tablet Take 81 mg by mouth daily.    Marland Kitchen atorvastatin (LIPITOR) 40 MG tablet TAKE ONE TABLET BY MOUTH ONCE DAILY 90 tablet 1  . Butalbital-APAP-Caffeine (FIORICET) 50-300-40 MG CAPS Take 1 capsule every 6 hours as needed oral for headache/knee pain.    Marland Kitchen CARTIA XT 180 MG 24 hr capsule TAKE ONE CAPSULE BY MOUTH ONCE DAILY 90 capsule 0  . cetirizine (ZYRTEC) 10 MG tablet Take 1 tablet (10 mg total) by mouth daily.  30 tablet 6  . dicyclomine (BENTYL) 10 MG capsule Take 10 mg by mouth 3 (three) times daily.    . diphenoxylate-atropine (LOMOTIL) 2.5-0.025 MG tablet Take by mouth. Take 1 tablet by mouth 4 times a day as needed for Diarrhea.    Marland Kitchen FLUoxetine (PROZAC) 40 MG capsule Take 1 capsule by mouth daily.    . fluticasone (FLONASE) 50 MCG/ACT nasal spray Place 2 sprays into both nostrils daily. 16 g 6  . folic acid (FOLVITE) 1 MG tablet TAKE ONE TABLET BY MOUTH ONCE DAILY 30 tablet 11  . furosemide (LASIX) 20 MG tablet TAKE ONE TABLET BY MOUTH ONCE DAILY AS NEEDED FOR  EDEMA 30 tablet 2  . furosemide (LASIX) 40 MG tablet Take 1 tablet by mouth once daily as needed for edema.    Marland Kitchen glipiZIDE (GLUCOTROL) 5 MG tablet TAKE ONE TABLET BY MOUTH ONCE DAILY BEFORE BREAKFAST 30 tablet 5  . glucose blood test strip Check blood sugar no more than three times daily. 300 each 5  .  ipratropium (ATROVENT HFA) 17 MCG/ACT inhaler Inhale 2 puffs into the lungs every 6 (six) hours as needed for wheezing. 1 Inhaler 12  . metFORMIN (GLUCOPHAGE-XR) 500 MG 24 hr tablet TAKE ONE TABLET BY MOUTH ONCE DAILY WITH BREAKFAST 90 tablet 0  . metolazone (ZAROXOLYN) 2.5 MG tablet Take 2.5 mg by mouth daily.    . metoprolol succinate (TOPROL-XL) 50 MG 24 hr tablet TAKE ONE TABLET BY MOUTH TWICE DAILY 60 tablet 5  . mirtazapine (REMERON) 30 MG tablet Take 30 mg by mouth at bedtime.    . NONFORMULARY OR COMPOUNDED ITEM Compression stockings 20-30 mg hg #1 pair 1 each 0  . NYSTATIN powder APPLY  POWDER TOPICALLY UP TO 4 TIMES DAILY 56.7 g 1  . omeprazole (PRILOSEC) 40 MG capsule TAKE ONE CAPSULE BY MOUTH TWICE DAILY 180 capsule 0  . ONE TOUCH LANCETS MISC Check blood sugar no more than three times daily 300 each 5  . oxybutynin (DITROPAN XL) 15 MG 24 hr tablet TAKE ONE TABLET BY MOUTH ONCE DAILY 30 tablet 11  . potassium chloride (K-DUR) 10 MEQ tablet TAKE ONE TABLET BY MOUTH THREE TIMES DAILY (Patient taking differently: Take 2 tablets Monday Wednesday and Friday.) 90 tablet 2  . torsemide (DEMADEX) 20 MG tablet     . traMADol (ULTRAM) 50 MG tablet Take 1 tablet (50 mg total) by mouth every 6 (six) hours as needed (for pain). 30 tablet 0  . Vitamin D, Ergocalciferol, (DRISDOL) 50000 units CAPS capsule Take 1 capsule (50,000 Units total) by mouth every 7 (seven) days. 6 capsule 0  . zolpidem (AMBIEN) 5 MG tablet Take 5 mg by mouth once.     No facility-administered medications prior to visit.     Allergies  Allergen Reactions  . Dilaudid [Hydromorphone] Other (Stephanie Comments)    Per daughter cause confusion and hallucinations. Causes confusion and hallucinations per pt's daughter  . Lorazepam Rash and Other (Stephanie Comments)    Other reaction(s): Hallucinations hallucinations  hallucinations  hallucinations   . Morphine And Related Itching  . Acyclovir And Related   . Amlodipine Besylate       Pedal edema  . Buprenorphine Hcl Itching    Review of Systems  Constitutional: Negative for fever and malaise/fatigue.  HENT: Negative for congestion.   Eyes: Negative for blurred vision.  Respiratory: Negative for shortness of breath.   Cardiovascular: Negative for chest pain, palpitations and leg swelling.  Gastrointestinal: Negative  for abdominal pain, blood in stool and nausea.  Genitourinary: Negative for dysuria and frequency.  Musculoskeletal: Positive for back pain, joint pain and myalgias. Negative for falls.  Skin: Negative for rash.  Neurological: Negative for dizziness, loss of consciousness and headaches.  Endo/Heme/Allergies: Negative for environmental allergies.  Psychiatric/Behavioral: Negative for depression. The patient is not nervous/anxious.        Objective:    Physical Exam  Constitutional: She is oriented to person, place, and time. She appears well-developed and well-nourished.  HENT:  Head: Normocephalic and atraumatic.  Eyes: Conjunctivae and EOM are normal.  Neck: Normal range of motion. Neck supple. No JVD present. Carotid bruit is not present. No thyromegaly present.  Cardiovascular: Normal rate, regular rhythm and normal heart sounds.   No murmur heard. Pulmonary/Chest: Effort normal and breath sounds normal. No respiratory distress. She has no wheezes. She has no rales. She exhibits no tenderness.  Musculoskeletal: She exhibits tenderness. She exhibits no edema.       Right hip: She exhibits decreased range of motion, decreased strength and tenderness.       Lumbar back: She exhibits decreased range of motion, tenderness, pain and spasm.  Neurological: She is alert and oriented to person, place, and time.  Psychiatric: She has a normal mood and affect.  Nursing note and vitals reviewed.   BP (!) 142/60 (BP Location: Left Arm, Cuff Size: Normal)   Pulse 70   Temp 98 F (36.7 C) (Oral)   Resp 16   Ht 5\' 4"  (1.626 m)   Wt 240 lb 9.6 oz  (109.1 kg)   SpO2 98%   BMI 41.30 kg/m  Wt Readings from Last 3 Encounters:  05/07/17 240 lb 9.6 oz (109.1 kg)  04/05/17 242 lb (109.8 kg)  03/26/17 246 lb (111.6 kg)   BP Readings from Last 3 Encounters:  05/07/17 (!) 142/60  03/26/17 120/72  03/07/17 (!) 144/74     Immunization History  Administered Date(s) Administered  . Tdap 10/23/2012    Health Maintenance  Topic Date Due  . FOOT EXAM  06/25/1953  . OPHTHALMOLOGY EXAM  06/25/1953  . PNA vac Low Risk Adult (2 of 2 - PPSV23) 06/16/2016  . HEMOGLOBIN A1C  05/02/2017  . URINE MICROALBUMIN  06/21/2017  . INFLUENZA VACCINE  06/26/2017  . MAMMOGRAM  01/22/2018  . TETANUS/TDAP  10/23/2022  . COLONOSCOPY  09/15/2024  . DEXA SCAN  Completed    Lab Results  Component Value Date   WBC 7.4 11/05/2016   HGB 11.5 (L) 11/05/2016   HCT 35.4 (L) 11/05/2016   PLT 251.0 11/05/2016   GLUCOSE 90 11/05/2016   CHOL 139 03/07/2017   TRIG 196.0 (H) 03/07/2017   HDL 31.20 (L) 03/07/2017   LDLDIRECT 83.0 01/26/2016   LDLCALC 69 03/07/2017   ALT 15 11/05/2016   AST 14 11/05/2016   NA 140 11/05/2016   K 4.8 11/05/2016   CL 110 11/05/2016   CREATININE 0.94 11/05/2016   BUN 19 11/05/2016   CO2 22 11/05/2016   TSH 2.73 03/07/2017   INR 0.88 12/27/2009   HGBA1C 7.5 (H) 11/01/2016   MICROALBUR <0.7 06/21/2016    Lab Results  Component Value Date   TSH 2.73 03/07/2017   Lab Results  Component Value Date   WBC 7.4 11/05/2016   HGB 11.5 (L) 11/05/2016   HCT 35.4 (L) 11/05/2016   MCV 89.9 11/05/2016   PLT 251.0 11/05/2016   Lab Results  Component Value Date   NA  140 11/05/2016   K 4.8 11/05/2016   CO2 22 11/05/2016   GLUCOSE 90 11/05/2016   BUN 19 11/05/2016   CREATININE 0.94 11/05/2016   BILITOT 0.3 11/05/2016   ALKPHOS 79 11/05/2016   AST 14 11/05/2016   ALT 15 11/05/2016   PROT 6.3 11/05/2016   ALBUMIN 3.7 11/05/2016   CALCIUM 9.0 11/05/2016   ANIONGAP 8 09/14/2016   GFR 61.98 11/05/2016   Lab Results    Component Value Date   CHOL 139 03/07/2017   Lab Results  Component Value Date   HDL 31.20 (L) 03/07/2017   Lab Results  Component Value Date   LDLCALC 69 03/07/2017   Lab Results  Component Value Date   TRIG 196.0 (H) 03/07/2017   Lab Results  Component Value Date   CHOLHDL 4 03/07/2017   Lab Results  Component Value Date   HGBA1C 7.5 (H) 11/01/2016         Assessment & Plan:   Problem List Items Addressed This Visit      Unprioritized   Right hip pain - Primary    With arthritis on xray  Refer to ortho Short rx hydrocodone given--- no further rx will be given here toradol IM       Relevant Medications   tiZANidine (ZANAFLEX) 4 MG tablet   ketorolac (TORADOL) injection 60 mg (Completed)   HYDROcodone-acetaminophen (NORCO/VICODIN) 5-325 MG tablet   Other Relevant Orders   Ambulatory referral to Orthopedic Surgery     I am having Ms. Yankowski start on tiZANidine. I am also having her maintain her FLUoxetine, aspirin, zolpidem, mirtazapine, fluticasone, glucose blood, ONE TOUCH LANCETS, alendronate, CARTIA XT, furosemide, NONFORMULARY OR COMPOUNDED ITEM, folic acid, oxybutynin, metoprolol succinate, Vitamin D (Ergocalciferol), potassium chloride, ipratropium, dicyclomine, furosemide, diphenoxylate-atropine, metolazone, traMADol, Butalbital-APAP-Caffeine, acetaminophen, metFORMIN, atorvastatin, glipiZIDE, cetirizine, torsemide, nystatin, omeprazole, ibuprofen, QUEtiapine, and HYDROcodone-acetaminophen. We administered ketorolac.  Meds ordered this encounter  Medications  . DISCONTD: HYDROcodone-acetaminophen (NORCO/VICODIN) 5-325 MG tablet    Sig: TK 1 T PO EVERY 4 HOURS PRN FOR PAIN    Refill:  0  . ibuprofen (ADVIL,MOTRIN) 800 MG tablet    Refill:  0  . QUEtiapine (SEROQUEL) 25 MG tablet  . tiZANidine (ZANAFLEX) 4 MG tablet    Sig: Take 1 tablet (4 mg total) by mouth every 6 (six) hours as needed for muscle spasms.    Dispense:  30 tablet    Refill:  1  .  ketorolac (TORADOL) injection 60 mg  . HYDROcodone-acetaminophen (NORCO/VICODIN) 5-325 MG tablet    Sig: TK 1 T PO EVERY 4 HOURS PRN FOR PAIN    Dispense:  30 tablet    Refill:  0    CMA served as scribe during this visit. History, Physical and Plan performed by medical provider. Documentation and orders reviewed and attested to.  Ann Held, DO

## 2017-05-22 ENCOUNTER — Telehealth: Payer: Self-pay | Admitting: Family Medicine

## 2017-05-22 DIAGNOSIS — M25551 Pain in right hip: Secondary | ICD-10-CM

## 2017-05-22 NOTE — Telephone Encounter (Signed)
Relation to FS:ELTR Call back number:[phone]   Reason for call:  Patient states she has a scheduled orthopedic appointment for Tuesday 05/28/17 and patient states she's in a lot of pain requesting refill of her pain medication to hold her over until specialist appointment. Please advise

## 2017-05-22 NOTE — Telephone Encounter (Signed)
Hi Yvonne- I'm so sorry I did not get to this message until tonight and now I cannot call in hydrocodone for her.   Will defer to you tomorrow am- please apologize to pt for me, I did not realize this needed to be handled while I was physically in the office

## 2017-05-22 NOTE — Telephone Encounter (Signed)
Has appt with Dr Durward Fortes scheduled on 05/28/17.  Last filled 05/07/17.  Last seen 05/07/17

## 2017-05-23 MED ORDER — HYDROCODONE-ACETAMINOPHEN 5-325 MG PO TABS: ORAL_TABLET | ORAL | 0 refills | Status: DC

## 2017-05-23 NOTE — Telephone Encounter (Signed)
Patient notified and will be by to pickup.  rx in pickup drawer at front.

## 2017-05-23 NOTE — Telephone Encounter (Signed)
Give 2 weeks worth

## 2017-05-28 ENCOUNTER — Telehealth: Payer: Self-pay | Admitting: Family Medicine

## 2017-05-28 ENCOUNTER — Ambulatory Visit (INDEPENDENT_AMBULATORY_CARE_PROVIDER_SITE_OTHER): Payer: PPO | Admitting: Orthopaedic Surgery

## 2017-05-28 ENCOUNTER — Other Ambulatory Visit (INDEPENDENT_AMBULATORY_CARE_PROVIDER_SITE_OTHER): Payer: Self-pay

## 2017-05-28 ENCOUNTER — Ambulatory Visit (INDEPENDENT_AMBULATORY_CARE_PROVIDER_SITE_OTHER): Payer: PPO

## 2017-05-28 DIAGNOSIS — M5440 Lumbago with sciatica, unspecified side: Secondary | ICD-10-CM

## 2017-05-28 DIAGNOSIS — G8929 Other chronic pain: Secondary | ICD-10-CM | POA: Diagnosis not present

## 2017-05-28 DIAGNOSIS — M5442 Lumbago with sciatica, left side: Secondary | ICD-10-CM

## 2017-05-28 NOTE — Progress Notes (Unsigned)
r lumbar

## 2017-05-28 NOTE — Progress Notes (Signed)
Office Visit Note   Patient: Stephanie Mckenzie           Date of Birth: [birth year]           MRN: [id number] Visit Date: 05/28/2017              Requested by: [address], [place], [place] [address] [place], [place] [address] PCP: Carollee Herter, Alferd Apa, DO   Assessment & Plan: Visit Diagnoses:  1. Chronic bilateral low back pain with left-sided sciatica   Degenerative disc disease L5-S1 by plain film. I think this could explain at least some of her pain. May continue with muscle relaxant and Aleve. We will not order oxycodone. I would prefer that Dr. Ivy Lynn continue with all of her medicines as she is complicated and is on multiple meds. Hopefully the MRI scan will help [place] determine the cause of her pain we can treat these with injections  Plan: MRI scan lumbar spine.   Follow-Up Instructions: No Follow-up on file.   Orders:  Orders Placed This Encounter  Procedures  . XR Lumbar Spine 2-3 Views   No orders of the defined types were placed in this encounter.     Procedures: No procedures performed   Clinical Data: No additional findings.   Subjective: Chief Complaint  Patient presents with  . Right Hip - Pain  Stephanie Mckenzie relates at least a 3 week history of low back, buttock and left greater than right thigh pain. She denies any history of injury or trauma. She's not had any fever or chills. She was seen in the [facility] regional emergency room with negative films of the pelvis. She was placed on oxycodone. She follow-up with her primary care physician, Dr. Ivy Lynn with continuation of the oxycodone and a muscle relaxant. Today she is not "having that much pain". But on occasion it is "exquisite". She's had chiropractic manipulation. She's had a prior lumbar laminectomy by one of the neurosurgeons but does not remember his name or the exact year. She doesn't specifically have any groin pain or knee pain. Majority of pain is in her back left buttock and to a lesser  expected extent left thigh. On occasion she's had to use a walker. She has a number of comorbidities including a major depressive disorder, congestive heart failure, diabetes mellitus. Films of her pelvis were obtained at [facility] regional demonstrating some degenerative changes of the sacroiliac joints. No related degenerative changes in either hip. I am not able to view the films on the PACS system. HPI  Review of Systems   Objective: Vital Signs: There were no vitals taken for this visit.  Physical Exam  Ortho Exam straight leg raise negative bilaterally for any back or buttock pain some thigh pain on the right posteriorly. No pain to range of motion of either knee. Some stasis changes and dry skin along both distal legs. Feet were warm. Toes were just minimally cool. Good capillary refill. Skin was dry some of both feet. Painless range of motion both hips. Percussible tenderness lumbar spine. No pain over either greater tuberosity or if she'll tuberosity ambulates very deliberately with a short-based gait. Does relate a history of peripheral neuropathy related to her diabetes  Specialty Comments:  No specialty comments available.  Imaging: No results found.   PMFS History: Patient Active Problem List   Diagnosis Date Noted  . Right hip pain 05/07/2017  . Chronic pain syndrome 04/09/2017  . Neuropathy 04/09/2017  .  Hypersensitivity pneumonitis ([place]) 03/26/2017  . Type 2 diabetes mellitus with neurological complications ([place]) 62/37/6283  . Morbid obesity ([place]) 03/17/2017  . Left elbow pain 03/11/2017  . Right shoulder pain 03/11/2017  . OSA (obstructive sleep apnea) 08/30/2016  . Diastolic dysfunction 15/17/6160  . Synovitis of knee 06/05/2016  . Hyperglycemia 11/17/2015  . Disorder of uterus 01/05/2015  . Degenerative joint disease involving multiple joints 01/05/2015  . Muscle ache 01/05/2015  . Bulge of lumbar disc without myelopathy 01/05/2015  . Jaw pain 01/05/2015    . IBS (irritable bowel syndrome) 01/05/2015  . Abnormal cardiovascular function study 01/05/2015  . Adjustment disorder with depressed mood 01/05/2015  . Degenerative arthritis of spine 01/05/2015  . Excessive falling 01/05/2015  . Edema 10/09/2015  . Effusion, pericardium 09/07/2015  . Acute post-traumatic headache, not intractable 09/06/2015  . Memory loss 07/25/2015  . Mental confusion 06/07/2015  . Avitaminosis D 05/16/2015  . B12 deficiency 05/16/2015  . Arteriosclerosis of coronary artery 05/16/2015  . Fibrositis 05/16/2015  . Fibromyalgia 05/16/2015  . MDD (major depressive disorder) 05/13/2015  . Affective disorder, major 05/13/2015  . Depression   . Suicidal ideation   . Chronic kidney disease 01/19/2015  . Bursitis, trochanteric 01/18/2015  . H/O gastrointestinal disease 09/30/2014  . Mass of pelvis 09/24/2014  . CAD in native artery 09/24/2014  . Cyst of ovary 08/26/2014  . Colitis 08/11/2014  . Colitis gravis ([place]) 08/05/2014  . Ulcerative colitis ([place]) 08/05/2014  . 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 Anxiety 06/09/2013  . Pain in joint, ankle and foot 06/09/2013  . GERD (gastroesophageal reflux disease) 06/09/2013  . Insomnia 06/09/2013  . Constipation 06/09/2013  . Depression with anxiety 05/22/2013  . Bimalleolar fracture 05/07/2013  . Essential hypertension, benign 05/07/2013  . Insomnia, unspecified 05/07/2013  . Pain in limb 05/07/2013  . OP (osteoporosis) 05/02/2013  . Difficulty hearing 05/02/2013  . HLD (hyperlipidemia) 05/02/2013  . HPTH (hyperparathyroidism) ([place]) 05/02/2013  . Malaise and fatigue 05/02/2013  . Clinical depression 05/02/2013  . Moderate episode of recurrent major depressive disorder ([place]) 05/02/2013  . Hyperparathyroidism ([place]) 05/02/2013   Past Medical History:  Diagnosis Date  . CHF (congestive heart failure) ([place])   . Depression   . Edema 10/09/2015  . GERD (gastroesophageal reflux disease)   . High cholesterol   .  Hypertension   . Migraine     Family History  Problem Relation Age of Onset  . Diabetes Mother   . Hypertension Mother   . Thyroid disease Mother   . Pancreatitis Father   . COPD Sister   . Rheumatologic disease Neg Hx     Past Surgical History:  Procedure Laterality Date  . ABDOMINAL HYSTERECTOMY    . APPENDECTOMY    . CESAREAN SECTION     x 2  . CORONARY ANGIOPLASTY    . LAMINECTOMY    . PARATHYROIDECTOMY     Social History   Occupational History  . Retired    Social History Main Topics  . Smoking status: Former Smoker    Packs/day: 1.00    Years: 30.00    Types: Cigarettes    Quit date: 11/26/2012  . Smokeless tobacco: Never Used  . Alcohol use 0.0 oz/week     Comment: occasional  . Drug use: No  . Sexual activity: Not on file     Garald Balding, MD   Note - This record has been created using [employer].  Chart creation errors have been sought, but  may not always  have been located. Such creation errors do not reflect on  the standard of medical care.  

## 2017-05-28 NOTE — Telephone Encounter (Signed)
Verbal order given  

## 2017-05-28 NOTE — Telephone Encounter (Signed)
Caller name: [name] with [facility] Can be reached: [phone]  Reason for call: requesting VO to eval pt this Thursday. Unable to reach pt today to schedule.

## 2017-05-30 ENCOUNTER — Telehealth: Payer: Self-pay | Admitting: Family Medicine

## 2017-05-30 NOTE — Telephone Encounter (Signed)
[facility] Physical Therapist -   Able to reach pt today, pt requested that PT start tomorrow (05/31/17) instead of today.   Please authorize - [phone]

## 2017-05-30 NOTE — Telephone Encounter (Signed)
Called PT left detailed message ok to start 05/31/17

## 2017-05-31 DIAGNOSIS — Z87891 Personal history of nicotine dependence: Secondary | ICD-10-CM | POA: Diagnosis not present

## 2017-05-31 DIAGNOSIS — I509 Heart failure, unspecified: Secondary | ICD-10-CM | POA: Diagnosis not present

## 2017-05-31 DIAGNOSIS — R2689 Other abnormalities of gait and mobility: Secondary | ICD-10-CM | POA: Diagnosis not present

## 2017-05-31 DIAGNOSIS — E119 Type 2 diabetes mellitus without complications: Secondary | ICD-10-CM | POA: Diagnosis not present

## 2017-05-31 DIAGNOSIS — F329 Major depressive disorder, single episode, unspecified: Secondary | ICD-10-CM | POA: Diagnosis not present

## 2017-05-31 DIAGNOSIS — M1611 Unilateral primary osteoarthritis, right hip: Secondary | ICD-10-CM | POA: Diagnosis not present

## 2017-05-31 DIAGNOSIS — Z7984 Long term (current) use of oral hypoglycemic drugs: Secondary | ICD-10-CM | POA: Diagnosis not present

## 2017-05-31 DIAGNOSIS — I110 Hypertensive heart disease with heart failure: Secondary | ICD-10-CM | POA: Diagnosis not present

## 2017-05-31 DIAGNOSIS — M545 Low back pain: Secondary | ICD-10-CM | POA: Diagnosis not present

## 2017-05-31 DIAGNOSIS — R600 Localized edema: Secondary | ICD-10-CM | POA: Diagnosis not present

## 2017-06-03 ENCOUNTER — Telehealth: Payer: Self-pay | Admitting: Family Medicine

## 2017-06-03 NOTE — Telephone Encounter (Signed)
[place] PT form [facility] [phone]  Want to request verbal order for home health PT for  patient for 2x one week 1x  For 4wks

## 2017-06-04 NOTE — Telephone Encounter (Signed)
Left message on machine with verbal order  

## 2017-06-05 ENCOUNTER — Telehealth: Payer: Self-pay | Admitting: *Deleted

## 2017-06-05 NOTE — Telephone Encounter (Signed)
Received Client Coordination Report RE: Medication Interaction [x3], forwarded to provider/SLS 07/11

## 2017-06-06 DIAGNOSIS — F4323 Adjustment disorder with mixed anxiety and depressed mood: Secondary | ICD-10-CM | POA: Diagnosis not present

## 2017-06-06 DIAGNOSIS — F331 Major depressive disorder, recurrent, moderate: Secondary | ICD-10-CM | POA: Diagnosis not present

## 2017-06-07 ENCOUNTER — Telehealth: Payer: Self-pay | Admitting: Family Medicine

## 2017-06-07 ENCOUNTER — Other Ambulatory Visit: Payer: Self-pay | Admitting: Family Medicine

## 2017-06-07 NOTE — Telephone Encounter (Signed)
Called home health left message to call us back.

## 2017-06-07 NOTE — Telephone Encounter (Signed)
noted 

## 2017-06-07 NOTE — Telephone Encounter (Signed)
Caller name: [name] PT with [facility] Can be reached: [phone]  Reason for call: Pt fell in home on 06/05/17 11:00pm in her home. She was cooking a hamburger and it started burning/smoking. Pt got up from chair to go to the stove and tripped and fell forward. Pt has carpet burn on left elbow. No other bruises or injuries noted. Pt feels sore.

## 2017-06-13 ENCOUNTER — Ambulatory Visit
Admission: RE | Admit: 2017-06-13 | Discharge: 2017-06-13 | Disposition: A | Discharge status: Home / Self Care | Payer: PPO | Source: Ambulatory Visit | Attending: Orthopaedic Surgery | Admitting: Orthopaedic Surgery

## 2017-06-13 ENCOUNTER — Other Ambulatory Visit: Payer: Self-pay

## 2017-06-13 DIAGNOSIS — M5126 Other intervertebral disc displacement, lumbar region: Secondary | ICD-10-CM | POA: Diagnosis not present

## 2017-06-13 DIAGNOSIS — M5440 Lumbago with sciatica, unspecified side: Secondary | ICD-10-CM

## 2017-06-14 ENCOUNTER — Telehealth: Payer: Self-pay

## 2017-06-14 ENCOUNTER — Telehealth: Payer: Self-pay | Admitting: Family Medicine

## 2017-06-14 DIAGNOSIS — M25551 Pain in right hip: Secondary | ICD-10-CM

## 2017-06-14 NOTE — Telephone Encounter (Signed)
Pt is requesting a refill on 2 medications. she said that she will not see Rhumotologist until a few weeks. She would like to know if provider could refill for her meanwhile?   HYDROcodone-acetaminophen, TiZANidine   Please call back to advise

## 2017-06-14 NOTE — Telephone Encounter (Signed)
Refill x1 

## 2017-06-14 NOTE — Telephone Encounter (Signed)
Ow up call made to patient regarding call she made to [facility] about a fall and hip pain. No answer,left message for return call.

## 2017-06-14 NOTE — Telephone Encounter (Signed)
Last refill #30 on 05/13/2017 Last office visit on 05/07/2017 No Contract/no UDS

## 2017-06-17 MED ORDER — HYDROCODONE-ACETAMINOPHEN 5-325 MG PO TABS: ORAL_TABLET | ORAL | 0 refills | Status: AC

## 2017-06-17 MED ORDER — TIZANIDINE HCL 4 MG PO TABS: 4.0000 mg | ORAL_TABLET | Freq: Four times a day (QID) | ORAL | 0 refills | Status: AC | PRN

## 2017-06-17 NOTE — Telephone Encounter (Signed)
Patient notified that rx is at front for pickup.

## 2017-06-18 ENCOUNTER — Telehealth: Payer: Self-pay | Admitting: *Deleted

## 2017-06-18 NOTE — Telephone Encounter (Signed)
Received Home Health Certification and Plan of Care; forwarded to provider/SLS 07/24  

## 2017-06-24 ENCOUNTER — Encounter (INDEPENDENT_AMBULATORY_CARE_PROVIDER_SITE_OTHER): Payer: Self-pay | Admitting: Orthopaedic Surgery

## 2017-06-24 ENCOUNTER — Ambulatory Visit (INDEPENDENT_AMBULATORY_CARE_PROVIDER_SITE_OTHER): Payer: PPO | Admitting: Orthopaedic Surgery

## 2017-06-24 VITALS — BP 167/77 | HR 104 | Resp 12 | Ht 64.0 in | Wt 240.0 lb

## 2017-06-24 DIAGNOSIS — G8929 Other chronic pain: Secondary | ICD-10-CM | POA: Diagnosis not present

## 2017-06-24 DIAGNOSIS — M545 Low back pain, unspecified: Secondary | ICD-10-CM

## 2017-06-24 NOTE — Progress Notes (Signed)
Office Visit Note   Patient: Stephanie Mckenzie           Date of Birth: May 27, 1941           MRN: [id number] Visit Date: 06/24/2017              Requested by: [address], [place], [place] [address] [place], [place] [address] PCP: Carollee Herter, Alferd Apa, DO   Assessment & Plan: Visit Diagnoses:  1. Chronic bilateral low back pain without sciatica   MRI scan demonstrates a prior laminectomy on the right at L5-S1. There is a 5 mm retrolisthesis. There is disc degeneration with prominent diffuse endplate spurring similar to a prior study. There is mild subarticular and foraminal stenosis bilaterally which is also similar to the prior study. There are mild degenerative changes elsewhere in the lumbar spine which are similar to the prior study performed in February 2010  Plan: Long discussion over 30-40 minutes regarding all of her problems. This is Metts does have some back pain but I think her other issues are more significant. I think she has fibromyalgia. She has diabetes with peripheral neuropathy. She is experiencing diffuse nondescript upper extremity pain. She also is having pain in both lower extremities with numbness tingling and burning in both of her feet. I have suggested weight loss and exercises. She should follow-up with her primary care physician in terms of reviewing treatment for her other medical problems. She seemed somewhat concerned about all of her medicines and if they were ineffective. I don't think at this point that further evaluation of the lumbar spine as necessary. On also not convinced that all of her lower extremity joint problems at all related to her back. Long discussion regarding exercises and need for weight loss. Also needs to really discuss all of her medical issues with her primary care physician and she seems somewhat confused about her diagnoses and medicines  Follow-Up Instructions: Return in about 2 weeks (around 07/08/2017).   Orders:  No orders  of the defined types were placed in this encounter.  No orders of the defined types were placed in this encounter.     Procedures: No procedures performed   Clinical Data: No additional findings.   Subjective: Chief Complaint  Patient presents with  . Lower Back - Results    Ms. Music is a 59 t o that is here for results of lumbar spine. Pt diabetic   MRI scan results as above. [name] does have diffuse pain in both shoulders and elbows arms and forearms. She also was having pain in both of her lower extremities associated with numbness and tingling in both of her feet. She does have history of diabetes with neuropathy. She is on gabapentin. She is also on chronic pain medicines. She has complained of bilateral hip and groin discomfort. She's had prior films of the lumbar spine that Mrs. Ray degenerative changes. Limited views of both of her hips on the spine films reveal some degenerative changes HPI  Review of Systems   Objective: Vital Signs: BP (!) 167/77   Pulse (!) 104   Resp 12   Ht 5\' 4"  (1.626 m)   Wt 240 lb (108.9 kg)   BMI 41.20 kg/m   Physical Exam  Ortho Exam no significant percussible tenderness of the lumbar spine. She had multiple areas of tenderness about both of her upper extremities and I think is consistent with fibromyalgia. She's having some groin discomfort  but without any loss of motion with internal/external rotation. She does have some altered sensibility in both of her feet associated with some swelling of her ankles and her feet. Skin was intact. +1 pulses  Specialty Comments:  No specialty comments available.  Imaging: No results found.   PMFS History: Patient Active Problem List   Diagnosis Date Noted  . Right hip pain 05/07/2017  . Chronic pain syndrome 04/09/2017  . Neuropathy 04/09/2017  . Hypersensitivity pneumonitis ([place]) 03/26/2017  . Type 2 diabetes mellitus with neurological complications ([place]) 41/96/2229  . Morbid obesity  ([place]) 03/17/2017  . Left elbow pain 03/11/2017  . Right shoulder pain 03/11/2017  . OSA (obstructive sleep apnea) 08/30/2016  . Diastolic dysfunction 79/89/2119  . Synovitis of knee 06/05/2016  . Hyperglycemia 11/17/2015  . Disorder of uterus 05/05/2015  . Degenerative joint disease involving multiple joints 05/05/2015  . Muscle ache 05/05/2015  . Bulge of lumbar disc without myelopathy 05/05/2015  . Jaw pain 05/05/2015  . IBS (irritable bowel syndrome) 05/05/2015  . Abnormal cardiovascular function study 05/05/2015  . Adjustment disorder with depressed mood 05/05/2015  . Degenerative arthritis of spine 05/05/2015  . Excessive falling 05/05/2015  . Edema 10/09/2015  . Effusion, pericardium 09/07/2015  . Acute post-traumatic headache, not intractable 09/06/2015  . Memory loss 07/25/2015  . Mental confusion 06/07/2015  . Avitaminosis D 05/16/2015  . B12 deficiency 05/16/2015  . Arteriosclerosis of coronary artery 05/16/2015  . Fibrositis 05/16/2015  . Fibromyalgia 05/16/2015  . MDD (major depressive disorder) 05/13/2015  . Affective disorder, major 05/13/2015  . Depression   . Suicidal ideation   . Chronic kidney disease 01/19/2015  . Bursitis, trochanteric 01/18/2015  . H/O gastrointestinal disease 09/30/2014  . Mass of pelvis 09/24/2014  . CAD in native artery 09/24/2014  . Cyst of ovary 08/26/2014  . Colitis 08/11/2014  . Colitis gravis ([place]) 08/05/2014  . Ulcerative colitis ([place]) 08/05/2014  . 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 Anxiety 06/09/2013  . Pain in joint, ankle and foot 06/09/2013  . GERD (gastroesophageal reflux disease) 06/09/2013  . Insomnia 06/09/2013  . Constipation 06/09/2013  . Depression with anxiety 05/22/2013  . Bimalleolar fracture 05/07/2013  . Essential hypertension, benign 05/07/2013  . Insomnia, unspecified 05/07/2013  . Pain in limb 05/07/2013  . OP (osteoporosis) 05/02/2013  . Difficulty hearing 05/02/2013  . HLD (hyperlipidemia)  05/02/2013  . HPTH (hyperparathyroidism) ([place]) 05/02/2013  . Malaise and fatigue 05/02/2013  . Clinical depression 05/02/2013  . Moderate episode of recurrent major depressive disorder ([place]) 05/02/2013  . Hyperparathyroidism ([place]) 05/02/2013   Past Medical History:  Diagnosis Date  . CHF (congestive heart failure) ([place])   . Depression   . Edema 10/09/2015  . GERD (gastroesophageal reflux disease)   . High cholesterol   . Hypertension   . Migraine     Family History  Problem Relation Age of Onset  . Diabetes Mother   . Hypertension Mother   . Thyroid disease Mother   . Pancreatitis Father   . COPD Sister   . Rheumatologic disease Neg Hx     Past Surgical History:  Procedure Laterality Date  . ABDOMINAL HYSTERECTOMY    . APPENDECTOMY    . CESAREAN SECTION     x 2  . CORONARY ANGIOPLASTY    . LAMINECTOMY    . PARATHYROIDECTOMY     Social History   Occupational History  . Retired    Social History Main Topics  . Smoking status: Former Smoker  Packs/day: 1.00    Years: 30.00    Types: Cigarettes    Quit date: 11/26/2012  . Smokeless tobacco: Never Used  . Alcohol use 0.0 oz/week     Comment: occasional  . Drug use: No  . Sexual activity: Not on file     Garald Balding, MD   Note - This record has been created using [employer].  Chart creation errors have been sought, but may not always  have been located. Such creation errors do not reflect on  the standard of medical care.

## 2017-06-26 ENCOUNTER — Other Ambulatory Visit: Payer: Self-pay | Admitting: Family Medicine

## 2017-06-27 DIAGNOSIS — Z7984 Long term (current) use of oral hypoglycemic drugs: Secondary | ICD-10-CM | POA: Diagnosis not present

## 2017-06-27 DIAGNOSIS — I110 Hypertensive heart disease with heart failure: Secondary | ICD-10-CM | POA: Diagnosis not present

## 2017-06-27 DIAGNOSIS — Z87891 Personal history of nicotine dependence: Secondary | ICD-10-CM | POA: Diagnosis not present

## 2017-06-27 DIAGNOSIS — R2689 Other abnormalities of gait and mobility: Secondary | ICD-10-CM | POA: Diagnosis not present

## 2017-06-27 DIAGNOSIS — M1611 Unilateral primary osteoarthritis, right hip: Secondary | ICD-10-CM | POA: Diagnosis not present

## 2017-06-27 DIAGNOSIS — E119 Type 2 diabetes mellitus without complications: Secondary | ICD-10-CM | POA: Diagnosis not present

## 2017-06-27 DIAGNOSIS — R600 Localized edema: Secondary | ICD-10-CM | POA: Diagnosis not present

## 2017-06-27 DIAGNOSIS — F329 Major depressive disorder, single episode, unspecified: Secondary | ICD-10-CM | POA: Diagnosis not present

## 2017-06-27 DIAGNOSIS — M545 Low back pain: Secondary | ICD-10-CM | POA: Diagnosis not present

## 2017-06-27 DIAGNOSIS — I509 Heart failure, unspecified: Secondary | ICD-10-CM | POA: Diagnosis not present

## 2017-07-10 DIAGNOSIS — M9902 Segmental and somatic dysfunction of thoracic region: Secondary | ICD-10-CM | POA: Diagnosis not present

## 2017-07-10 DIAGNOSIS — M9904 Segmental and somatic dysfunction of sacral region: Secondary | ICD-10-CM | POA: Diagnosis not present

## 2017-07-10 DIAGNOSIS — M5030 Other cervical disc degeneration, unspecified cervical region: Secondary | ICD-10-CM | POA: Diagnosis not present

## 2017-07-10 DIAGNOSIS — M9901 Segmental and somatic dysfunction of cervical region: Secondary | ICD-10-CM | POA: Diagnosis not present

## 2017-07-10 DIAGNOSIS — M9903 Segmental and somatic dysfunction of lumbar region: Secondary | ICD-10-CM | POA: Diagnosis not present

## 2017-07-15 DIAGNOSIS — M9901 Segmental and somatic dysfunction of cervical region: Secondary | ICD-10-CM | POA: Diagnosis not present

## 2017-07-15 DIAGNOSIS — M9903 Segmental and somatic dysfunction of lumbar region: Secondary | ICD-10-CM | POA: Diagnosis not present

## 2017-07-15 DIAGNOSIS — M5030 Other cervical disc degeneration, unspecified cervical region: Secondary | ICD-10-CM | POA: Diagnosis not present

## 2017-07-15 DIAGNOSIS — M9904 Segmental and somatic dysfunction of sacral region: Secondary | ICD-10-CM | POA: Diagnosis not present

## 2017-07-22 DIAGNOSIS — M9902 Segmental and somatic dysfunction of thoracic region: Secondary | ICD-10-CM | POA: Diagnosis not present

## 2017-07-22 DIAGNOSIS — M5030 Other cervical disc degeneration, unspecified cervical region: Secondary | ICD-10-CM | POA: Diagnosis not present

## 2017-07-22 DIAGNOSIS — M9903 Segmental and somatic dysfunction of lumbar region: Secondary | ICD-10-CM | POA: Diagnosis not present

## 2017-07-22 DIAGNOSIS — M9901 Segmental and somatic dysfunction of cervical region: Secondary | ICD-10-CM | POA: Diagnosis not present

## 2017-07-22 DIAGNOSIS — M9904 Segmental and somatic dysfunction of sacral region: Secondary | ICD-10-CM | POA: Diagnosis not present

## 2017-07-26 DIAGNOSIS — M9904 Segmental and somatic dysfunction of sacral region: Secondary | ICD-10-CM | POA: Diagnosis not present

## 2017-07-26 DIAGNOSIS — M9901 Segmental and somatic dysfunction of cervical region: Secondary | ICD-10-CM | POA: Diagnosis not present

## 2017-07-26 DIAGNOSIS — M9903 Segmental and somatic dysfunction of lumbar region: Secondary | ICD-10-CM | POA: Diagnosis not present

## 2017-07-26 DIAGNOSIS — M5030 Other cervical disc degeneration, unspecified cervical region: Secondary | ICD-10-CM | POA: Diagnosis not present

## 2017-08-01 DIAGNOSIS — L299 Pruritus, unspecified: Secondary | ICD-10-CM | POA: Diagnosis not present

## 2017-08-01 DIAGNOSIS — L853 Xerosis cutis: Secondary | ICD-10-CM | POA: Diagnosis not present

## 2017-08-02 DIAGNOSIS — M5030 Other cervical disc degeneration, unspecified cervical region: Secondary | ICD-10-CM | POA: Diagnosis not present

## 2017-08-02 DIAGNOSIS — M9903 Segmental and somatic dysfunction of lumbar region: Secondary | ICD-10-CM | POA: Diagnosis not present

## 2017-08-02 DIAGNOSIS — M9901 Segmental and somatic dysfunction of cervical region: Secondary | ICD-10-CM | POA: Diagnosis not present

## 2017-08-02 DIAGNOSIS — M9902 Segmental and somatic dysfunction of thoracic region: Secondary | ICD-10-CM | POA: Diagnosis not present

## 2017-08-02 DIAGNOSIS — M9904 Segmental and somatic dysfunction of sacral region: Secondary | ICD-10-CM | POA: Diagnosis not present

## 2017-08-12 DIAGNOSIS — M9901 Segmental and somatic dysfunction of cervical region: Secondary | ICD-10-CM | POA: Diagnosis not present

## 2017-08-12 DIAGNOSIS — M9903 Segmental and somatic dysfunction of lumbar region: Secondary | ICD-10-CM | POA: Diagnosis not present

## 2017-08-12 DIAGNOSIS — M9902 Segmental and somatic dysfunction of thoracic region: Secondary | ICD-10-CM | POA: Diagnosis not present

## 2017-08-12 DIAGNOSIS — M9904 Segmental and somatic dysfunction of sacral region: Secondary | ICD-10-CM | POA: Diagnosis not present

## 2017-08-12 DIAGNOSIS — M5030 Other cervical disc degeneration, unspecified cervical region: Secondary | ICD-10-CM | POA: Diagnosis not present

## 2017-08-14 ENCOUNTER — Telehealth: Payer: Self-pay

## 2017-08-14 NOTE — Telephone Encounter (Signed)
Pt called stating that she felt like her arms are on fire. She states that she does not understand why she is experiencing this pain.  I explained that she has moderate neuropathy.  Pt states that she was unaware that neuropathy was a painful condition.  She wanted to be seen by Dr. Delice Lesch sooner than her appointment (9/24).  I let her know that Dr. Delice Lesch is out of the office until Monday and that there was no way I would be able to schedule her before then.  She began crying saying that she just "doesn't understand how Dr. Delice Lesch can not have anything available before Monday" I asked if she has been taking her hydrocodone and ultram.  She states that she has not taken her ultram recently as she ran out and that she will not take her hydrocodone for fear of becoming dependent.  I advised she go to the ER if her pain is as bad as she says.  Pt stated that she did not like going to the ER because the last time she went she "was there 9 hours, they prescribed hydrocodone and sent her home"

## 2017-08-19 ENCOUNTER — Ambulatory Visit (INDEPENDENT_AMBULATORY_CARE_PROVIDER_SITE_OTHER): Payer: PPO | Admitting: Neurology

## 2017-08-19 ENCOUNTER — Encounter: Payer: Self-pay | Admitting: Neurology

## 2017-08-19 VITALS — BP 170/88 | HR 104 | Ht 64.0 in | Wt 238.0 lb

## 2017-08-19 DIAGNOSIS — E0842 Diabetes mellitus due to underlying condition with diabetic polyneuropathy: Secondary | ICD-10-CM | POA: Diagnosis not present

## 2017-08-19 DIAGNOSIS — G894 Chronic pain syndrome: Secondary | ICD-10-CM

## 2017-08-19 MED ORDER — PREGABALIN 50 MG PO CAPS: 50.0000 mg | ORAL_CAPSULE | Freq: Three times a day (TID) | ORAL | 0 refills | Status: AC

## 2017-08-19 NOTE — Progress Notes (Signed)
NEUROLOGY FOLLOW UP OFFICE NOTE  Stephanie Mckenzie [id number]  [birth year]  HISTORY OF PRESENT ILLNESS: I had the pleasure of seeing Stephanie Mckenzie in follow-up in the neurology clinic on 08/19/2017.  The patient was last seen 4 months ago for neuropathy. She is tearful in the office today reporting a lot of pain, she has pain in her legs, but also reports pains in both arms, with tenderness over her arm muscles and a knot on her left bicep region. She reports burning and shooting pain on both her legs and arms. She reports a cramp in the right posterior upper thigh region at the end of July where she could barely walk, then the pain started to go to her right groin area. She went to [facility] ER where she was discharged on hydrocodone and tizanidine. She takes the hydrocodone once a day. She reports being "miserable ever since." She cannot lift her arms above her shoulders, there is pain when she presses on her joints in the arms. She clarifies today that she had not previously taken Lyrica, instead had been prescribed gabapentin by her psychiatrist(?) which cause confusion "talking out of my head," hallucinating, and having a car accident. She was diagnosed with fibromyalgia and told that "I could not be helped." She denies any falls.   She had an EMG/NCV of both lower extremities last May 2018 which showed a symmetric and chronic sensorimotor polyneuropathy, axon loss in type, moderate in degree. Neuropathy likely due to diabetes, her last HbA1c in December 2017 was 7.5.  HPI 07/25/15: This is a 76 yo RH woman with a history of hypertension, hyperlipidemia, depression, anxiety, fibromyalgia, who presented for confusion and worsening memory. She and her daughter report confusion and memory changes became more noticeable after inpatient psychiatric admission for depression last June 2016. There was some medication changes made, she was confused about how she was supposed to take her medications. She went to  her PCP office in July but apparently had no appointment and was scheduled to see psychiatry instead. She was noted to come in her pajama top inside out and backwards. Her daughter has noticed difficulties with word-finding, misplacing things at home. She had some confusion on gabapentin instructions, and her daughter had initially helped her filling her pillbox, but the patient now takes care of this herself and denies missing medications. She had a hysterectomy last November and had some confusion while on pain medications. She states that she gets confused about what she is doing, confused about the date and gets appointments mixed up. She lives by herself and denies getting lost driving. She occasionally forgets a bill payment. She easily gets distracted, loses her train of thought. Her daughter denies that she repeats herself, and denies any paranoia. Her daughter does report that her mother does not care anymore about her her hygiene and would not bathe or change clothes.   She has frontal throbbing headaches every couple of days that did improved upon stopping Lisinopril. Headaches resolve pretty quickly if she takes Advil or Excedrin. If headaches are more severe, she takes BC powder, usually taking a couple a week. She has had some nausea the past couple of weeks, independent of the headaches ("I can't find anything to eat to soothe my stomach"). She has chronic back pain, as well as aching in her arms, shoulders, back, and legs. She has been taking Gabapentin 300mg  BID for the past couple of months for the fibromyalgia. She denies taking the Tramadol or  Trazodone prescribed. She denies any dizziness, diplopia, dysarthria, dysphagia, focal numbness/tingling/weakness. No anosmia or tremors. She had a concussion 4-5 years ago with brief loss of consciousness. She drinks alcohol occasionally. No family history of memory loss. At the end of the visit, she endorsed continued depression, she feels the  Trintillex started 4 weeks ago is not helping. She became tearful relating her stress with family, that they do not love her and do not call her if they would be gone for 3 days. I spoke to her daughter after the visit, and she reports she and her brother are very involved with her care, but that her mother has let her depression for 20+years go untreated, and it has been harder to deal with this.  PAST MEDICAL HISTORY: Past Medical History:  Diagnosis Date  . CHF (congestive heart failure) ([place])   . Depression   . Edema 10/09/2015  . GERD (gastroesophageal reflux disease)   . High cholesterol   . Hypertension   . Migraine     MEDICATIONS: Current Outpatient Prescriptions on File Prior to Visit  Medication Sig Dispense Refill  . acetaminophen (TYLENOL) 325 MG tablet Take 1 tablet by mouth every 4 hours as needed for pain.    Marland Kitchen alendronate (FOSAMAX) 70 MG tablet Take 1 tablet (70 mg total) by mouth every 7 (seven) days. Take with a full glass of water on an empty stomach. 4 tablet 11  . aspirin 81 MG tablet Take 81 mg by mouth daily.    Marland Kitchen atorvastatin (LIPITOR) 40 MG tablet TAKE ONE TABLET BY MOUTH ONCE DAILY 90 tablet 1  . Butalbital-APAP-Caffeine (FIORICET) 50-300-40 MG CAPS Take 1 capsule every 6 hours as needed oral for headache/knee pain.    Marland Kitchen CARTIA XT 180 MG 24 hr capsule TAKE ONE CAPSULE BY MOUTH ONCE DAILY 90 capsule 0  . cetirizine (ZYRTEC) 10 MG tablet Take 1 tablet (10 mg total) by mouth daily. 30 tablet 6  . dicyclomine (BENTYL) 10 MG capsule Take 10 mg by mouth 3 (three) times daily.    . diphenoxylate-atropine (LOMOTIL) 2.5-0.025 MG tablet Take by mouth. Take 1 tablet by mouth 4 times a day as needed for Diarrhea.    Marland Kitchen FLUoxetine (PROZAC) 40 MG capsule Take 1 capsule by mouth daily.    . fluticasone (FLONASE) 50 MCG/ACT nasal spray Place 2 sprays into both nostrils daily. 16 g 6  . folic acid (FOLVITE) 1 MG tablet TAKE ONE TABLET BY MOUTH ONCE DAILY 30 tablet 11  .  furosemide (LASIX) 20 MG tablet TAKE ONE TABLET BY MOUTH ONCE DAILY AS NEEDED FOR  EDEMA 30 tablet 2  . furosemide (LASIX) 40 MG tablet Take 1 tablet by mouth once daily as needed for edema.    Marland Kitchen glipiZIDE (GLUCOTROL) 5 MG tablet TAKE ONE TABLET BY MOUTH ONCE DAILY BEFORE BREAKFAST 30 tablet 5  . glucose blood test strip Check blood sugar no more than three times daily. 300 each 5  . HYDROcodone-acetaminophen (NORCO/VICODIN) 5-325 MG tablet TK 1 T PO EVERY 4 HOURS PRN FOR PAIN 30 tablet 0  . hydrOXYzine (ATARAX/VISTARIL) 10 MG tablet Take 1 tab twice a day as needed for anxiety    . ibuprofen (ADVIL,MOTRIN) 800 MG tablet   0  . ipratropium (ATROVENT HFA) 17 MCG/ACT inhaler Inhale 2 puffs into the lungs every 6 (six) hours as needed for wheezing. 1 Inhaler 12  . metFORMIN (GLUCOPHAGE-XR) 500 MG 24 hr tablet TAKE ONE TABLET BY MOUTH ONCE DAILY WITH  BREAKFAST 90 tablet 0  . metolazone (ZAROXOLYN) 2.5 MG tablet Take 2.5 mg by mouth daily.    . metoprolol succinate (TOPROL-XL) 50 MG 24 hr tablet TAKE ONE TABLET BY MOUTH TWICE DAILY 60 tablet 5  . mirtazapine (REMERON) 30 MG tablet Take 30 mg by mouth at bedtime.    . NONFORMULARY OR COMPOUNDED ITEM Compression stockings 20-30 mg hg #1 pair 1 each 0  . NYSTATIN powder APPLY  POWDER TOPICALLY UP TO 4 TIMES DAILY 56.7 g 1  . omeprazole (PRILOSEC) 40 MG capsule TAKE 1 CAPSULE BY MOUTH TWICE DAILY 180 capsule 0  . ONE TOUCH LANCETS MISC Check blood sugar no more than three times daily 300 each 5  . oxybutynin (DITROPAN XL) 15 MG 24 hr tablet TAKE ONE TABLET BY MOUTH ONCE DAILY 30 tablet 11  . potassium chloride (K-DUR) 10 MEQ tablet TAKE ONE TABLET BY MOUTH THREE TIMES DAILY 90 tablet 2  . QUEtiapine (SEROQUEL) 25 MG tablet     . tiZANidine (ZANAFLEX) 4 MG tablet Take 1 tablet (4 mg total) by mouth every 6 (six) hours as needed for muscle spasms. 30 tablet 0  . torsemide (DEMADEX) 20 MG tablet     . traMADol (ULTRAM) 50 MG tablet Take 1 tablet (50 mg  total) by mouth every 6 (six) hours as needed (for pain). 30 tablet 0  . Vitamin D, Ergocalciferol, (DRISDOL) 50000 units CAPS capsule Take 1 capsule (50,000 Units total) by mouth every 7 (seven) days. 6 capsule 0  . zolpidem (AMBIEN) 5 MG tablet Take 5 mg by mouth once.     No current facility-administered medications on file prior to visit.     ALLERGIES: Allergies  Allergen Reactions  . Dilaudid [Hydromorphone] Other (See Comments)    Per daughter cause confusion and hallucinations. Causes confusion and hallucinations per pt's daughter  . Lorazepam Rash and Other (See Comments)    Other reaction(s): Hallucinations hallucinations  hallucinations  hallucinations   . Morphine And Related Itching  . Acyclovir And Related   . Amlodipine Besylate     Pedal edema  . Buprenorphine Hcl Itching    FAMILY HISTORY: Family History  Problem Relation Age of Onset  . Diabetes Mother   . Hypertension Mother   . Thyroid disease Mother   . Pancreatitis Father   . COPD Sister   . Rheumatologic disease Neg Hx     SOCIAL HISTORY: Social History   Social History  . Marital status: Divorced    Spouse name: N/A  . Number of children: 2  . Years of education: N/A   Occupational History  . Retired    Social History Main Topics  . Smoking status: Former Smoker    Packs/day: 1.00    Years: 30.00    Types: Cigarettes    Quit date: 11/26/2012  . Smokeless tobacco: Never Used  . Alcohol use 0.0 oz/week     Comment: occasional  . Drug use: No  . Sexual activity: Not on file   Other Topics Concern  . Not on file   Social History Narrative   [facility] (12/07/16):   Originally from [place], [place]. Has always lived in [place]. Previous home/residence had "black mold". No bird exposure. 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 Has also worked for a Merchandiser, retail and serving food.     REVIEW OF SYSTEMS: Constitutional: No fevers, chills, or  sweats, no generalized fatigue, change in appetite Eyes:  No visual changes, double vision, eye pain Ear, nose and throat: No hearing loss, ear pain, nasal congestion, sore throat Cardiovascular: No chest pain, palpitations Respiratory:  No shortness of breath at rest or with exertion, wheezes GastrointestinaI: No nausea, vomiting, diarrhea, abdominal pain, fecal incontinence Genitourinary:  No dysuria, urinary retention or frequency Musculoskeletal:  + neck pain, back pain Integumentary: No rash, pruritus, skin lesions Neurological: as above Psychiatric: + depression, insomnia, anxiety Endocrine: No palpitations, fatigue, diaphoresis, mood swings, change in appetite, +change in weight Hematologic/Lymphatic:  No anemia, purpura, petechiae. Allergic/Immunologic: no itchy/runny eyes, nasal congestion, recent allergic reactions, rashes  PHYSICAL EXAM: Vitals:   08/19/17 1554  BP: (!) 170/88  Pulse: (!) 104  SpO2: 97%   General: No acute distress, again became tearful relating her pain everywhere Head:  Normocephalic/atraumatic Skin/Extremities: No rash, no edema Neurological Exam: alert and oriented to person, place, and time. No aphasia or dysarthria. Fund of knowledge is appropriate.  Recent and remote memory are intact.  Attention and concentration are normal.    Able to name objects and repeat phrases. Cranial nerves: Pupils equal, round. No facial asymmetry. Motor: moves all extremities symmetrically but reports pain. Sensation intact to all modalities on both upper extremities, decreased cold on both feet, intact pin, decreased vibration to ankles bilaterally. No extinction to double simultaneous stimulation.  Deep tendon reflexes +2 throughout, toes downgoing.  Finger to nose testing intact.  Gait slow and cautious reporting hip pain and pain in her legs, no ataxia, could not tandem walk.    IMPRESSION: This is a 76 yo RH woman with a history of hypertension, hyperlipidemia, diabetes,  depression, anxiety, fibromyalgia, who initially presented for confusion and memory changes. On her last visit, she was concerned about neuropathy, and presents today with worsening pain. EMG/NCV confirmed moderate polyneuropathy, likely due to diabetes. We discussed the diffuse pains she is having, the leg pains are likely due to neuropathy, however the pains in her arms and joints are not neurological, she had previously been referred to Pain Management but did not follow through, referral will be sent again today. She had side effects on Gabapentin and would like to try Lyrica, low dose 50mg  qhs x 1 week, then increase to 100mg  qhs x 1 week. If tolerating medication, increase to 50mg  in AM, 100mg  in PM. Side effects were discussed. We also discussed the importance of better glucose control. She will follow-up in 4-5 months and knows to call for any changes.  Thank you for allowing me to participate in her care.  Please do not hesitate to call for any questions or concerns.  The duration of this appointment visit was 25 minutes of face-to-face time with the patient.  Greater than 50% of this time was spent in counseling, explanation of diagnosis, planning of further management, and coordination of care.   Ellouise Newer, M.D.   CC: Dr. Etter Sjogren

## 2017-08-19 NOTE — Patient Instructions (Addendum)
1. Refer to Pain Management in [place] 2. Start Lyrica 50mg : Take 1 capsule at night for 1 week, then increase to 2 capsules at night for 1 week. If not drowsy, increase to 1 cap in AM, 2 caps in PM 3. Follow-up in 4-5 months, call for any changes

## 2017-08-20 ENCOUNTER — Encounter: Payer: Self-pay | Admitting: Neurology

## 2017-08-20 DIAGNOSIS — E0842 Diabetes mellitus due to underlying condition with diabetic polyneuropathy: Secondary | ICD-10-CM | POA: Insufficient documentation

## 2017-08-21 ENCOUNTER — Other Ambulatory Visit: Payer: Self-pay

## 2017-08-21 DIAGNOSIS — G894 Chronic pain syndrome: Secondary | ICD-10-CM

## 2017-09-12 DIAGNOSIS — F331 Major depressive disorder, recurrent, moderate: Secondary | ICD-10-CM | POA: Diagnosis not present

## 2017-09-12 DIAGNOSIS — F4323 Adjustment disorder with mixed anxiety and depressed mood: Secondary | ICD-10-CM | POA: Diagnosis not present

## 2017-09-21 DIAGNOSIS — M791 Myalgia, unspecified site: Secondary | ICD-10-CM | POA: Diagnosis not present

## 2017-09-21 DIAGNOSIS — R51 Headache: Secondary | ICD-10-CM | POA: Diagnosis not present

## 2017-09-21 DIAGNOSIS — L03115 Cellulitis of right lower limb: Secondary | ICD-10-CM | POA: Diagnosis not present

## 2017-09-21 DIAGNOSIS — Z888 Allergy status to other drugs, medicaments and biological substances status: Secondary | ICD-10-CM | POA: Diagnosis not present

## 2017-09-21 DIAGNOSIS — Z7984 Long term (current) use of oral hypoglycemic drugs: Secondary | ICD-10-CM | POA: Diagnosis not present

## 2017-09-21 DIAGNOSIS — R600 Localized edema: Secondary | ICD-10-CM | POA: Diagnosis not present

## 2017-09-21 DIAGNOSIS — Z885 Allergy status to narcotic agent status: Secondary | ICD-10-CM | POA: Diagnosis not present

## 2017-09-21 DIAGNOSIS — I10 Essential (primary) hypertension: Secondary | ICD-10-CM | POA: Diagnosis not present

## 2017-09-21 DIAGNOSIS — M7989 Other specified soft tissue disorders: Secondary | ICD-10-CM | POA: Diagnosis not present

## 2017-09-21 DIAGNOSIS — E1142 Type 2 diabetes mellitus with diabetic polyneuropathy: Secondary | ICD-10-CM | POA: Diagnosis not present

## 2017-09-21 DIAGNOSIS — R609 Edema, unspecified: Secondary | ICD-10-CM | POA: Diagnosis not present

## 2017-09-21 DIAGNOSIS — Z7982 Long term (current) use of aspirin: Secondary | ICD-10-CM | POA: Diagnosis not present

## 2017-09-21 DIAGNOSIS — Z79899 Other long term (current) drug therapy: Secondary | ICD-10-CM | POA: Diagnosis not present

## 2017-09-21 DIAGNOSIS — Z87891 Personal history of nicotine dependence: Secondary | ICD-10-CM | POA: Diagnosis not present

## 2017-09-21 DIAGNOSIS — M7918 Myalgia, other site: Secondary | ICD-10-CM | POA: Diagnosis not present

## 2017-09-27 ENCOUNTER — Telehealth: Payer: Self-pay | Admitting: Neurology

## 2017-09-27 ENCOUNTER — Other Ambulatory Visit: Payer: Self-pay | Admitting: Family Medicine

## 2017-09-27 MED ORDER — OXYBUTYNIN CHLORIDE ER 15 MG PO TB24: 15.0000 mg | ORAL_TABLET | Freq: Every day | ORAL | 1 refills | Status: DC

## 2017-09-27 NOTE — Telephone Encounter (Signed)
Pt called and said she was taking lyraca and it is helping but also makes her sleepy, wanted Dr

## 2017-09-27 NOTE — Telephone Encounter (Signed)
Patient notified that rxs have been sent into pharmacy.

## 2017-10-02 ENCOUNTER — Other Ambulatory Visit: Payer: Self-pay

## 2017-10-02 ENCOUNTER — Telehealth: Payer: Self-pay | Admitting: Neurology

## 2017-10-02 NOTE — Telephone Encounter (Signed)
Rx below called in to pt's verified preferred pharmacy [facility] in [place])

## 2017-10-02 NOTE — Telephone Encounter (Signed)
Per LOV note pt should now be taking 50mg  AM and 100mg  PM.  Will call pt to verify.  Dr. Delice Lesch - if pt taking as above, do you want me to send 1 Rx for 50mg  or 2 Rx's - one for 50mg  one for 100mg ?

## 2017-10-02 NOTE — Telephone Encounter (Signed)
Pt left a voicemail message about the sample of lyraca Dr Delice Lesch gave her and she has one left, wants to know if she can get more samples

## 2017-10-02 NOTE — Telephone Encounter (Signed)
Spoke with pt - she states that taking 1 cap AM and 2 caps PM was making her too drowsy and she worries about her ability to drive taking that much.  States that she is only taking 1 cap qhs.  Will call Rx in for Lyrica 50mg  #30 w/ 11 refills to pt's pharmacy.   She also says that she got a call from pain management, however it was located in [place] (not sure how that happened)  Will send new referral for location in [facility].

## 2017-10-02 NOTE — Telephone Encounter (Signed)
If taking as above, pls send for 50mg  caps, 1 cap in AM, 2 caps in PM. Thanks!

## 2017-10-03 ENCOUNTER — Other Ambulatory Visit: Payer: Self-pay

## 2017-10-03 DIAGNOSIS — E559 Vitamin D deficiency, unspecified: Secondary | ICD-10-CM | POA: Diagnosis not present

## 2017-10-03 DIAGNOSIS — E119 Type 2 diabetes mellitus without complications: Secondary | ICD-10-CM | POA: Diagnosis not present

## 2017-10-03 DIAGNOSIS — E669 Obesity, unspecified: Secondary | ICD-10-CM | POA: Diagnosis not present

## 2017-10-03 DIAGNOSIS — D649 Anemia, unspecified: Secondary | ICD-10-CM | POA: Diagnosis not present

## 2017-10-03 DIAGNOSIS — E7800 Pure hypercholesterolemia, unspecified: Secondary | ICD-10-CM | POA: Diagnosis not present

## 2017-10-03 DIAGNOSIS — R809 Proteinuria, unspecified: Secondary | ICD-10-CM | POA: Diagnosis not present

## 2017-10-03 DIAGNOSIS — N250 Renal osteodystrophy: Secondary | ICD-10-CM | POA: Diagnosis not present

## 2017-10-03 DIAGNOSIS — E039 Hypothyroidism, unspecified: Secondary | ICD-10-CM | POA: Diagnosis not present

## 2017-10-03 DIAGNOSIS — I509 Heart failure, unspecified: Secondary | ICD-10-CM | POA: Diagnosis not present

## 2017-10-03 DIAGNOSIS — N183 Chronic kidney disease, stage 3 (moderate): Secondary | ICD-10-CM | POA: Diagnosis not present

## 2017-10-03 DIAGNOSIS — E211 Secondary hyperparathyroidism, not elsewhere classified: Secondary | ICD-10-CM | POA: Diagnosis not present

## 2017-10-03 DIAGNOSIS — K219 Gastro-esophageal reflux disease without esophagitis: Secondary | ICD-10-CM | POA: Diagnosis not present

## 2017-10-03 DIAGNOSIS — E785 Hyperlipidemia, unspecified: Secondary | ICD-10-CM | POA: Diagnosis not present

## 2017-10-03 DIAGNOSIS — E1169 Type 2 diabetes mellitus with other specified complication: Secondary | ICD-10-CM | POA: Diagnosis not present

## 2017-10-03 MED ORDER — OXYBUTYNIN CHLORIDE ER 15 MG PO TB24: 15.0000 mg | ORAL_TABLET | Freq: Every day | ORAL | 1 refills | Status: AC

## 2017-10-04 DIAGNOSIS — F331 Major depressive disorder, recurrent, moderate: Secondary | ICD-10-CM | POA: Diagnosis not present

## 2017-10-09 ENCOUNTER — Other Ambulatory Visit: Payer: Self-pay | Admitting: Family Medicine

## 2017-10-14 ENCOUNTER — Telehealth: Payer: Self-pay | Admitting: Family Medicine

## 2017-10-14 MED ORDER — ONETOUCH ULTRA 2 W/DEVICE KIT: PACK | 0 refills | Status: AC

## 2017-10-14 MED ORDER — ONETOUCH LANCETS MISC: 5 refills | Status: AC

## 2017-10-14 MED ORDER — GLUCOSE BLOOD VI STRP: ORAL_STRIP | 5 refills | Status: AC

## 2017-10-14 NOTE — Telephone Encounter (Signed)
Relation to KR:CVKF Call back number:[phone]   Reason for call:  Patient requesting glucose blood test strip and would like Rx sent in today  [place], [place] [phone] (Phone) [phone] (Fax)

## 2017-10-14 NOTE — Telephone Encounter (Signed)
Refill done as requested. Patient notified refill sent in.

## 2017-10-14 NOTE — Addendum Note (Signed)
Addended by: Sharon Seller B on: 10/14/2017 01:28 PM   Modules accepted: Orders

## 2017-10-22 ENCOUNTER — Ambulatory Visit: Payer: PPO | Admitting: Family Medicine

## 2017-10-24 ENCOUNTER — Ambulatory Visit: Payer: PPO | Admitting: Family Medicine

## 2017-10-25 ENCOUNTER — Ambulatory Visit: Payer: PPO | Admitting: Family Medicine

## 2017-10-25 DIAGNOSIS — R3981 Functional urinary incontinence: Secondary | ICD-10-CM | POA: Diagnosis not present

## 2017-10-25 DIAGNOSIS — R339 Retention of urine, unspecified: Secondary | ICD-10-CM | POA: Diagnosis not present

## 2017-10-25 DIAGNOSIS — R300 Dysuria: Secondary | ICD-10-CM | POA: Diagnosis not present

## 2017-10-28 ENCOUNTER — Ambulatory Visit: Payer: PPO | Admitting: Family Medicine

## 2017-10-28 ENCOUNTER — Encounter: Payer: Self-pay | Admitting: Family Medicine

## 2017-10-28 VITALS — BP 144/86 | HR 98 | Temp 97.8°F | Ht 64.0 in | Wt 252.1 lb

## 2017-10-28 DIAGNOSIS — I509 Heart failure, unspecified: Secondary | ICD-10-CM | POA: Diagnosis not present

## 2017-10-28 MED ORDER — FUROSEMIDE 40 MG PO TABS: 40.0000 mg | ORAL_TABLET | Freq: Every day | ORAL | 3 refills | Status: AC

## 2017-10-28 NOTE — Patient Instructions (Addendum)
Mind your salt intake.  Take 2 tabs of 20 mg Furosemide medicine daily until you run out. A new dose as been called in.   If you start having worsening symptoms, seek care sooner.  Keep elevating your legs and using compression stockings.   Weigh yourself daily. If you are not losing weight, come in this week for further evaluation.   Let us know if you need anything.

## 2017-10-28 NOTE — Progress Notes (Signed)
Pre visit review using our clinic review tool, if applicable. No additional management support is needed unless otherwise documented below in the visit note. 

## 2017-10-28 NOTE — Progress Notes (Signed)
Chief Complaint  Patient presents with  . Leg Pain    Stephanie Mckenzie is a 76 y.o. female here for a skin complaint.  Duration: 1 week Location: BLE's Pruritic? Yes Painful? Yes Drainage? Yes- clear/yellowish fluid from blisters on leg New soaps/lotions/topicals/detergents? No Other associated symptoms: 8lb wt gain from last week Therapies tried thus far: Cipro (rx'd for uti) Hx of CHF, furosemide 20 mg/d. It takes 60 min after taking med to urinate and she does not often urinate in the afternoon.  No warmth. Redness has gotten better from yesterday. She is having some scaling and itching as well.  Some blisters have formed and are draining clear fluid.    ROS:  Const: No fevers Skin: As noted in HPI  Past Medical History:  Diagnosis Date  . CHF (congestive heart failure) ([place])   . Depression   . Edema 10/09/2015  . GERD (gastroesophageal reflux disease)   . High cholesterol   . Hypertension   . Migraine    Allergies  Allergen Reactions  . Dilaudid [Hydromorphone] Other (See Comments)    Per daughter cause confusion and hallucinations. Causes confusion and hallucinations per pt's daughter  . Lorazepam Rash and Other (See Comments)    Other reaction(s): Hallucinations hallucinations  hallucinations  hallucinations   . Morphine And Related Itching  . Acyclovir And Related   . Amlodipine Besylate     Pedal edema  . Buprenorphine Hcl Itching   Allergies as of 10/28/2017      Reactions   Dilaudid [hydromorphone] Other (See Comments)   Per daughter cause confusion and hallucinations. Causes confusion and hallucinations per pt's daughter   Lorazepam Rash, Other (See Comments)   Other reaction(s): Hallucinations hallucinations  hallucinations  hallucinations    Morphine And Related Itching   Acyclovir And Related    Amlodipine Besylate    Pedal edema   Buprenorphine Hcl Itching      Medication List        Accurate as of 10/28/17  3:57 PM. Always use your  most recent med list.          acetaminophen 325 MG tablet Commonly known as:  TYLENOL Take 1 tablet by mouth every 4 hours as needed for pain.   alendronate 70 MG tablet Commonly known as:  FOSAMAX Take 1 tablet (70 mg total) by mouth every 7 (seven) days. Take with a full glass of water on an empty stomach.   aspirin 81 MG tablet Take 81 mg by mouth daily.   atorvastatin 40 MG tablet Commonly known as:  LIPITOR TAKE ONE TABLET BY MOUTH ONCE DAILY   CARTIA XT 180 MG 24 hr capsule Generic drug:  diltiazem TAKE ONE CAPSULE BY MOUTH ONCE DAILY   cetirizine 10 MG tablet Commonly known as:  ZYRTEC Take 1 tablet (10 mg total) by mouth daily.   dicyclomine 10 MG capsule Commonly known as:  BENTYL Take 10 mg by mouth 3 (three) times daily.   diphenoxylate-atropine 2.5-0.025 MG tablet Commonly known as:  LOMOTIL Take by mouth. Take 1 tablet by mouth 4 times a day as needed for Diarrhea.   FIORICET 50-300-40 MG Caps Generic drug:  Butalbital-APAP-Caffeine Take 1 capsule every 6 hours as needed oral for headache/knee pain.   FLUoxetine 40 MG capsule Commonly known as:  PROZAC Take 1 capsule by mouth daily.   fluticasone 50 MCG/ACT nasal spray Commonly known as:  FLONASE Place 2 sprays into both nostrils daily.   folic acid 1 MG  tablet Commonly known as:  FOLVITE TAKE ONE TABLET BY MOUTH ONCE DAILY   furosemide 40 MG tablet Commonly known as:  LASIX Take 1 tablet (40 mg total) by mouth daily.   glipiZIDE 5 MG tablet Commonly known as:  GLUCOTROL TAKE ONE TABLET BY MOUTH ONCE DAILY BEFORE BREAKFAST   glucose blood test strip Check blood sugar no more than three times daily.   HYDROcodone-acetaminophen 5-325 MG tablet Commonly known as:  NORCO/VICODIN TK 1 T PO EVERY 4 HOURS PRN FOR PAIN   hydrOXYzine 10 MG tablet Commonly known as:  ATARAX/VISTARIL Take 1 tab twice a day as needed for anxiety   ibuprofen 800 MG tablet Commonly known as:  ADVIL,MOTRIN     ipratropium 17 MCG/ACT inhaler Commonly known as:  ATROVENT HFA Inhale 2 puffs into the lungs every 6 (six) hours as needed for wheezing.   metFORMIN 500 MG 24 hr tablet Commonly known as:  GLUCOPHAGE-XR TAKE 1 TABLET BY MOUTH ONCE DAILY WITH BREAKFAST   metoprolol succinate 50 MG 24 hr tablet Commonly known as:  TOPROL-XL TAKE ONE TABLET BY MOUTH TWICE DAILY   mirtazapine 30 MG tablet Commonly known as:  REMERON Take 30 mg by mouth at bedtime.   NONFORMULARY OR COMPOUNDED ITEM Compression stockings 20-30 mg hg #1 pair   nystatin powder Generic drug:  nystatin APPLY  POWDER TOPICALLY UP TO 4 TIMES DAILY   omeprazole 40 MG capsule Commonly known as:  PRILOSEC TAKE 1 CAPSULE BY MOUTH TWICE DAILY   ONE TOUCH LANCETS Misc Check blood sugar no more than three times daily   ONE TOUCH ULTRA 2 w/Device Kit Use as directed to check blood sugar.   DXE11.9   oxybutynin 15 MG 24 hr tablet Commonly known as:  DITROPAN XL Take 1 tablet (15 mg total) daily by mouth.   potassium chloride 10 MEQ tablet Commonly known as:  K-DUR TAKE ONE TABLET BY MOUTH THREE TIMES DAILY   pregabalin 50 MG capsule Commonly known as:  LYRICA Take 1 capsule (50 mg total) by mouth 3 (three) times daily.   QUEtiapine 25 MG tablet Commonly known as:  SEROQUEL   tiZANidine 4 MG tablet Commonly known as:  ZANAFLEX Take 1 tablet (4 mg total) by mouth every 6 (six) hours as needed for muscle spasms.   torsemide 20 MG tablet Commonly known as:  DEMADEX   traMADol 50 MG tablet Commonly known as:  ULTRAM Take 1 tablet (50 mg total) by mouth every 6 (six) hours as needed (for pain).   Vitamin D (Ergocalciferol) 50000 units Caps capsule Commonly known as:  DRISDOL Take 1 capsule (50,000 Units total) by mouth every 7 (seven) days.   zolpidem 5 MG tablet Commonly known as:  AMBIEN Take 5 mg by mouth once.       BP (!) 144/86 (BP Location: Right Arm, Patient Position: Sitting, Cuff Size: Large)    Pulse 98   Temp 97.8 F (36.6 C) (Oral)   Ht _0  (1.626 m)   Wt 252 lb 2 oz (114.4 kg)   SpO2 93%   BMI 43.28 kg/m  Gen: awake, alert, appearing stated age Heart: Distant heart sounds, RRR, no bruits, 2+ pitting LE edema up to knees b/l Lungs: No accessory muscle use, scant rales in bases b/l, clear in upper lung field Skin: See below. +Diffuse TTP. No warmth, drainage, erythema, fluctuance, excoriation Psych: Age appropriate judgment and insight  Media Information    Media Information    Media  Information    Congestive heart failure, unspecified HF chronicity, unspecified heart failure type ([place]) - Plan: furosemide (LASIX) 40 MG tablet  This is too diffuse in LE to be cellulitis or DVT. Additionally, there is no warmth. With her recent weight gain and hx of CHF, this is likely 2/2 edema. She appears to have a poor response to her current dose of diuretic. Will increase from 20 mg/d to 40 mg/d with close follow up as she may need an afternoon dose. Daily weight checks. If not losing weight, return or let us know and we will likely add afternoon dose. She appears clinically without distress, thus I do not believe I need to send her to ED for eval and potential admission at this time. Warning s/s's verbalized and written down.  Dressings placed over leaking vesicles on LE's.  F/u in around 1 week with Dr Etter Sjogren.  The patient voiced understanding and agreement to the plan.  [place], DO 10/28/17 3:57 PM

## 2017-10-29 ENCOUNTER — Telehealth: Payer: Self-pay

## 2017-10-29 NOTE — Telephone Encounter (Signed)
Pt called.  She states that she thinks she is having a reaction to the Lyrica.  States that her feet and lower legs are swollen, and that she has lesions all over her ankles (later stated that what she calls "lesions" are actually water filled blisters).  I advised that it would be unlikely due to the Lyrica, since she has been on this medication for a little over 2 months - with good results - and these symptoms just started in last couple of days.  She states she takes 50mg  QHS, not TID as directed due to her living in independent living, and still being able to drive.  I advised pt to stop Lyrica for a few days to see if that clears up her symptoms as this would be the easiest way to determine if her symptoms are in fact related to the Lyrica. Pt will call with an update in a few days.

## 2017-10-30 ENCOUNTER — Other Ambulatory Visit: Payer: Self-pay | Admitting: Family Medicine

## 2017-11-12 DIAGNOSIS — N3592 Unspecified urethral stricture, female: Secondary | ICD-10-CM | POA: Diagnosis not present

## 2017-11-21 ENCOUNTER — Telehealth: Payer: Self-pay | Admitting: *Deleted

## 2017-11-21 NOTE — Telephone Encounter (Signed)
Copied from [place] [phone]. Topic: General - Call Back - No Documentation >> Nov 21, 2017 11:49 AM Ether Griffins B wrote: Reason for CRM: pt states she received a call from office and no VM.

## 2017-11-21 NOTE — Telephone Encounter (Signed)
Left message on machine that there are no messages that we called about anything.

## 2017-11-29 DIAGNOSIS — K219 Gastro-esophageal reflux disease without esophagitis: Secondary | ICD-10-CM | POA: Diagnosis not present

## 2017-11-29 DIAGNOSIS — E1169 Type 2 diabetes mellitus with other specified complication: Secondary | ICD-10-CM | POA: Diagnosis not present

## 2017-11-29 DIAGNOSIS — N183 Chronic kidney disease, stage 3 (moderate): Secondary | ICD-10-CM | POA: Diagnosis not present

## 2017-11-29 DIAGNOSIS — E669 Obesity, unspecified: Secondary | ICD-10-CM | POA: Diagnosis not present

## 2017-11-29 DIAGNOSIS — E785 Hyperlipidemia, unspecified: Secondary | ICD-10-CM | POA: Diagnosis not present

## 2017-11-29 DIAGNOSIS — I509 Heart failure, unspecified: Secondary | ICD-10-CM | POA: Diagnosis not present

## 2017-12-05 DIAGNOSIS — R809 Proteinuria, unspecified: Secondary | ICD-10-CM | POA: Diagnosis not present

## 2017-12-05 DIAGNOSIS — R309 Painful micturition, unspecified: Secondary | ICD-10-CM | POA: Diagnosis not present

## 2017-12-05 DIAGNOSIS — D649 Anemia, unspecified: Secondary | ICD-10-CM | POA: Diagnosis not present

## 2017-12-05 DIAGNOSIS — N189 Chronic kidney disease, unspecified: Secondary | ICD-10-CM | POA: Diagnosis not present

## 2017-12-05 DIAGNOSIS — E559 Vitamin D deficiency, unspecified: Secondary | ICD-10-CM | POA: Diagnosis not present

## 2017-12-19 DIAGNOSIS — N390 Urinary tract infection, site not specified: Secondary | ICD-10-CM | POA: Diagnosis not present

## 2018-01-04 DIAGNOSIS — S62357A Nondisplaced fracture of shaft of fifth metacarpal bone, left hand, initial encounter for closed fracture: Secondary | ICD-10-CM | POA: Diagnosis not present

## 2018-01-04 DIAGNOSIS — S6992XA Unspecified injury of left wrist, hand and finger(s), initial encounter: Secondary | ICD-10-CM | POA: Diagnosis not present

## 2018-01-04 DIAGNOSIS — M79642 Pain in left hand: Secondary | ICD-10-CM | POA: Diagnosis not present

## 2018-01-09 DIAGNOSIS — S62357A Nondisplaced fracture of shaft of fifth metacarpal bone, left hand, initial encounter for closed fracture: Secondary | ICD-10-CM | POA: Diagnosis not present

## 2018-01-09 DIAGNOSIS — M79642 Pain in left hand: Secondary | ICD-10-CM | POA: Diagnosis not present

## 2018-01-17 ENCOUNTER — Ambulatory Visit: Payer: PPO | Admitting: Neurology

## 2018-01-20 ENCOUNTER — Ambulatory Visit: Payer: PPO | Admitting: Neurology

## 2018-01-20 ENCOUNTER — Encounter: Payer: Self-pay | Admitting: Neurology

## 2018-01-24 ENCOUNTER — Other Ambulatory Visit: Payer: Self-pay | Admitting: *Deleted

## 2018-01-24 DIAGNOSIS — I10 Essential (primary) hypertension: Secondary | ICD-10-CM

## 2018-01-24 MED ORDER — DILTIAZEM HCL ER COATED BEADS 180 MG PO CP24: 180.0000 mg | ORAL_CAPSULE | Freq: Every day | ORAL | 0 refills | Status: AC

## 2018-01-27 DIAGNOSIS — N390 Urinary tract infection, site not specified: Secondary | ICD-10-CM | POA: Diagnosis not present

## 2018-01-28 ENCOUNTER — Encounter (INDEPENDENT_AMBULATORY_CARE_PROVIDER_SITE_OTHER): Payer: Self-pay | Admitting: Orthopaedic Surgery

## 2018-01-28 ENCOUNTER — Ambulatory Visit (INDEPENDENT_AMBULATORY_CARE_PROVIDER_SITE_OTHER): Payer: PPO | Admitting: Orthopaedic Surgery

## 2018-01-28 VITALS — Resp 20 | Ht 64.0 in | Wt 240.0 lb

## 2018-01-28 DIAGNOSIS — R1032 Left lower quadrant pain: Secondary | ICD-10-CM

## 2018-01-28 DIAGNOSIS — G8929 Other chronic pain: Secondary | ICD-10-CM | POA: Diagnosis not present

## 2018-01-28 DIAGNOSIS — M79601 Pain in right arm: Secondary | ICD-10-CM

## 2018-01-28 DIAGNOSIS — R1030 Lower abdominal pain, unspecified: Secondary | ICD-10-CM | POA: Diagnosis not present

## 2018-01-28 DIAGNOSIS — R1031 Right lower quadrant pain: Secondary | ICD-10-CM

## 2018-01-28 DIAGNOSIS — M545 Low back pain, unspecified: Secondary | ICD-10-CM

## 2018-01-28 DIAGNOSIS — M79602 Pain in left arm: Secondary | ICD-10-CM | POA: Diagnosis not present

## 2018-01-28 DIAGNOSIS — M797 Fibromyalgia: Secondary | ICD-10-CM

## 2018-01-28 NOTE — Progress Notes (Signed)
Office Visit Note   Patient: Stephanie Mckenzie           Date of Birth: Jun 07, 1941           MRN: [id number] Visit Date: 01/28/2018              Requested by: [address] [place], [place] [address] [place], [place] [address] PCP: Carollee Herter, Alferd Apa, DO   Assessment & Plan: Visit Diagnoses:  1. Chronic midline low back pain, with sciatica presence unspecified   2. Bilateral groin pain   3. Fibromyalgia   4. Pain in both upper extremities     Plan:  #1: At this time our plan would be for her to see primary care for referral to pain management or for them to take care of medications.  She certainly is on a host of medications and has a lot of chronic diseases which can be harmed by her anti-inflammatories.  Had a very long discussion with her about her symptoms and need for possible pain management which her family doctor can perform consultations for.  Also the fact that because of her diabetes were limited in her anti-inflammatories we could possibly use.  Spent over 1 hour to face with her discussing all this.  She is understanding.   Follow-Up Instructions: Return if symptoms worsen or fail to improve.   Face-to-face time spent with patient was greater than 60 minutes.  Greater than 50% of the time was spent in counseling and coordination of care.  Orders:  No orders of the defined types were placed in this encounter.  No orders of the defined types were placed in this encounter.     Procedures: No procedures performed   Clinical Data: No additional findings.   Subjective: Chief Complaint  Patient presents with  . Lower Back - Pain    All over back pain for and radiates down left leg for about 10 years, had lamenectomy in back.   . Right Shoulder - Pain  . Left Shoulder - Pain    HPI  Stephanie Mckenzie is a 37 year old white female who presents today with chronic whole body pain.  She was last seen in our office in July 2018 with multiple complaints.  At that  time was more that of low back pain with without sciatica.  An MRI scan had been performed documented in the chart.  She had a prior laminectomy at L5-S1 with a 5 mm retrolisthesis and disc degeneration with mild subarticular and foraminal stenosis bilaterally.  This was apparently similar to a previous study.  At that time she had a long discussion with Dr. Durward Fortes about options.  Because of her fibromyalgia as well as her diabetes and peripheral neuropathy it was suggested to have weight loss and do exercises and follow-up with her primary care physician.  Apparently the primary care physician she states is doing nothing for her but states there and types on her computer as she is being seen.  She states the medications that she uses are ineffective except for Aleve which is helpful.  However with her diabetes and her kidney problems this can only be limited.  He has not tried pain management.  She was told to return to her medical doctor to discuss her medical issues and positive that a pain management.  Today though she does essentially have whole body pain.  She complains of pain in her arms and legs and back.  Apparently had a  hysterectomy which had a dehiscence and she had to be in a nursing home for a month till it secondarily granulated.  She even feels pain in the fatty aspect of both her upper extremities.  Denies any neurovascular compromise at this time.  Review of Systems  Constitutional: Positive for fatigue. Negative for fever.  HENT: Positive for mouth sores.   Eyes: Negative for pain.  Respiratory: Negative for cough and shortness of breath.   Cardiovascular: Positive for leg swelling. Negative for chest pain and palpitations.  Gastrointestinal: Negative for blood in stool, constipation and diarrhea.  Genitourinary: Positive for difficulty urinating.  Musculoskeletal: Positive for back pain.  Skin: Negative for rash.  Allergic/Immunologic: Negative for food allergies.    Neurological: Positive for weakness and numbness.  Psychiatric/Behavioral: Negative for sleep disturbance. The patient is not nervous/anxious.      Objective: Vital Signs: Resp 20   Ht 5\' 4"  (1.626 m)   Wt 240 lb (108.9 kg)   BMI 41.20 kg/m   Physical Exam  Constitutional: She is oriented to person, place, and time. She appears well-developed and well-nourished.  Obese and very hard of hearing.  HENT:  Head: Normocephalic and atraumatic.  Eyes: EOM are normal. Pupils are equal, round, and reactive to light.  Pulmonary/Chest: She is in respiratory distress.  She appears to be using purse lipped breathing at times.  Neurological: She is alert and oriented to person, place, and time.  Skin: Skin is warm and dry.  Psychiatric: She has a normal mood and affect. Her behavior is normal. Judgment and thought content normal.    Ortho Exam  She has tenderness to palpation over the upper extremities as well as the lower extremities.  She is got some groin discomfort with motion.  Normal motion though.  Trace to 1+ pulses in the foot.  Specialty Comments:  No specialty comments available.  Imaging: No results found.   PMFS History: Patient Active Problem List   Diagnosis Date Noted  . Diabetic polyneuropathy associated with diabetes mellitus due to underlying condition ([place]) 08/20/2017  . Right hip pain 05/07/2017  . Chronic pain syndrome 04/09/2017  . Neuropathy 04/09/2017  . Hypersensitivity pneumonitis ([place]) 03/26/2017  . Type 2 diabetes mellitus with neurological complications ([place]) 83/15/1761  . Morbid obesity ([place]) 03/17/2017  . Left elbow pain 03/11/2017  . Right shoulder pain 03/11/2017  . OSA (obstructive sleep apnea) 08/30/2016  . Diastolic dysfunction 60/73/7106  . Synovitis of knee 06/05/2016  . Hyperglycemia 11/17/2015  . Disorder of uterus 2020/01/615  . Degenerative joint disease involving multiple joints 2020/01/615  . Muscle ache 2020/01/615  . Bulge of  lumbar disc without myelopathy 2020/01/615  . Jaw pain 2020/01/615  . IBS (irritable bowel syndrome) 2020/01/615  . Abnormal cardiovascular function study 2020/01/615  . Adjustment disorder with depressed mood 2020/01/615  . Degenerative arthritis of spine 2020/01/615  . Excessive falling 2020/01/615  . Edema 10/09/2015  . Effusion, pericardium 09/07/2015  . Acute post-traumatic headache, not intractable 09/06/2015  . Memory loss 07/25/2015  . Mental confusion 06/07/2015  . Avitaminosis D 05/16/2015  . B12 deficiency 05/16/2015  . Arteriosclerosis of coronary artery 05/16/2015  . Fibrositis 05/16/2015  . Fibromyalgia 05/16/2015  . MDD (major depressive disorder) 05/13/2015  . Affective disorder, major 05/13/2015  . Depression   . Suicidal ideation   . Chronic kidney disease 01/19/2015  . Bursitis, trochanteric 01/18/2015  . H/O gastrointestinal disease 09/30/2014  . Mass of pelvis 09/24/2014  . CAD in native artery 09/24/2014  .  Cyst of ovary 08/26/2014  . Colitis 08/11/2014  . Colitis gravis ([place]) 08/05/2014  . Ulcerative colitis ([place]) 08/05/2014  . 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 Anxiety 06/09/2013  . Pain in joint, ankle and foot 06/09/2013  . GERD (gastroesophageal reflux disease) 06/09/2013  . Insomnia 06/09/2013  . Constipation 06/09/2013  . Depression with anxiety 05/22/2013  . Bimalleolar fracture 05/07/2013  . Essential hypertension, benign 05/07/2013  . Insomnia, unspecified 05/07/2013  . Pain in limb 05/07/2013  . OP (osteoporosis) 05/02/2013  . Difficulty hearing 05/02/2013  . HLD (hyperlipidemia) 05/02/2013  . HPTH (hyperparathyroidism) ([place]) 05/02/2013  . Malaise and fatigue 05/02/2013  . Clinical depression 05/02/2013  . Moderate episode of recurrent major depressive disorder ([place]) 05/02/2013  . Hyperparathyroidism ([place]) 05/02/2013   Past Medical History:  Diagnosis Date  . CHF (congestive heart failure) ([place])   . Depression   . Edema 10/09/2015  . GERD  (gastroesophageal reflux disease)   . High cholesterol   . Hypertension   . Migraine     Family History  Problem Relation Age of Onset  . Diabetes Mother   . Hypertension Mother   . Thyroid disease Mother   . Pancreatitis Father   . COPD Sister   . Rheumatologic disease Neg Hx     Past Surgical History:  Procedure Laterality Date  . ABDOMINAL HYSTERECTOMY    . APPENDECTOMY    . CESAREAN SECTION     x 2  . CORONARY ANGIOPLASTY    . LAMINECTOMY    . PARATHYROIDECTOMY     Social History   Occupational History  . Occupation: Retired  Tobacco Use  . Smoking status: Former Smoker    Packs/day: 1.00    Years: 30.00    Pack years: 30.00    Types: Cigarettes    Last attempt to quit: 11/26/2012    Years since quitting: 5.1  . Smokeless tobacco: Never Used  Substance and Sexual Activity  . Alcohol use: Yes    Alcohol/week: 0.0 oz    Comment: occasional  . Drug use: No  . Sexual activity: Not on file

## 2018-01-29 ENCOUNTER — Telehealth: Payer: Self-pay | Admitting: *Deleted

## 2018-01-29 NOTE — Telephone Encounter (Signed)
[facility] requesting refill for dicyclomine 10mg  bid.  Last written by Dr Sherryll Burger [facility].  Do you want to write for patient.

## 2018-01-30 ENCOUNTER — Other Ambulatory Visit: Payer: Self-pay | Admitting: Family Medicine

## 2018-01-30 DIAGNOSIS — E785 Hyperlipidemia, unspecified: Secondary | ICD-10-CM | POA: Diagnosis not present

## 2018-01-30 DIAGNOSIS — I509 Heart failure, unspecified: Secondary | ICD-10-CM | POA: Diagnosis not present

## 2018-01-30 DIAGNOSIS — E1169 Type 2 diabetes mellitus with other specified complication: Secondary | ICD-10-CM | POA: Diagnosis not present

## 2018-01-30 DIAGNOSIS — N183 Chronic kidney disease, stage 3 (moderate): Secondary | ICD-10-CM | POA: Diagnosis not present

## 2018-01-30 DIAGNOSIS — E669 Obesity, unspecified: Secondary | ICD-10-CM | POA: Diagnosis not present

## 2018-01-30 DIAGNOSIS — K219 Gastro-esophageal reflux disease without esophagitis: Secondary | ICD-10-CM | POA: Diagnosis not present

## 2018-01-30 NOTE — Telephone Encounter (Signed)
Refill request should be sent to GI

## 2018-01-30 NOTE — Telephone Encounter (Signed)
Sent back to [facility] stating that they need to send to [facility]

## 2018-02-04 ENCOUNTER — Encounter: Payer: Self-pay | Admitting: Family Medicine

## 2018-02-04 ENCOUNTER — Ambulatory Visit: Payer: PPO | Admitting: Family Medicine

## 2018-02-04 VITALS — BP 132/70 | HR 87 | Temp 98.1°F | Resp 14 | Ht 64.17 in | Wt 248.2 lb

## 2018-02-04 DIAGNOSIS — E1122 Type 2 diabetes mellitus with diabetic chronic kidney disease: Secondary | ICD-10-CM | POA: Diagnosis not present

## 2018-02-04 DIAGNOSIS — M810 Age-related osteoporosis without current pathological fracture: Secondary | ICD-10-CM

## 2018-02-04 DIAGNOSIS — R0609 Other forms of dyspnea: Secondary | ICD-10-CM | POA: Diagnosis not present

## 2018-02-04 DIAGNOSIS — G894 Chronic pain syndrome: Secondary | ICD-10-CM | POA: Diagnosis not present

## 2018-02-04 DIAGNOSIS — Z1231 Encounter for screening mammogram for malignant neoplasm of breast: Secondary | ICD-10-CM

## 2018-02-04 DIAGNOSIS — K589 Irritable bowel syndrome without diarrhea: Secondary | ICD-10-CM | POA: Diagnosis not present

## 2018-02-04 DIAGNOSIS — R600 Localized edema: Secondary | ICD-10-CM

## 2018-02-04 DIAGNOSIS — E785 Hyperlipidemia, unspecified: Secondary | ICD-10-CM | POA: Diagnosis not present

## 2018-02-04 DIAGNOSIS — M797 Fibromyalgia: Secondary | ICD-10-CM

## 2018-02-04 DIAGNOSIS — G629 Polyneuropathy, unspecified: Secondary | ICD-10-CM | POA: Diagnosis not present

## 2018-02-04 DIAGNOSIS — Z1239 Encounter for other screening for malignant neoplasm of breast: Secondary | ICD-10-CM

## 2018-02-04 MED ORDER — DIPHENOXYLATE-ATROPINE 2.5-0.025 MG PO TABS: 1.0000 | ORAL_TABLET | Freq: Four times a day (QID) | ORAL | 1 refills | Status: AC | PRN

## 2018-02-04 MED ORDER — ALENDRONATE SODIUM 70 MG PO TABS: 70.0000 mg | ORAL_TABLET | ORAL | 3 refills | Status: AC

## 2018-02-04 NOTE — Assessment & Plan Note (Signed)
Encouraged heart healthy diet, increase exercise, avoid trans fats, consider a krill oil cap daily Pt stopped statin

## 2018-02-04 NOTE — Patient Instructions (Signed)
Edema Edema is an abnormal buildup of fluids in your bodytissues. Edema is somewhatdependent on gravity to pull the fluid to the lowest place in your body. That makes the condition more common in the legs and thighs (lower extremities). Painless swelling of the feet and ankles is common and becomes more likely as you get older. It is also common in looser tissues, like around your eyes. When the affected area is squeezed, the fluid may move out of that spot and leave a dent for a few moments. This dent is called pitting. What are the causes? There are many possible causes of edema. Eating too much salt and being on your feet or sitting for a long time can cause edema in your legs and ankles. Hot weather may make edema worse. Common medical causes of edema include:  Heart failure.  Liver disease.  Kidney disease.  Weak blood vessels in your legs.  Cancer.  An injury.  Pregnancy.  Some medications.  Obesity.  What are the signs or symptoms? Edema is usually painless.Your skin may look swollen or shiny. How is this diagnosed? Your health care provider may be able to diagnose edema by asking about your medical history and doing a physical exam. You may need to have tests such as X-rays, an electrocardiogram, or blood tests to check for medical conditions that may cause edema. How is this treated? Edema treatment depends on the cause. If you have heart, liver, or kidney disease, you need the treatment appropriate for these conditions. General treatment may include:  Elevation of the affected body part above the level of your heart.  Compression of the affected body part. Pressure from elastic bandages or support stockings squeezes the tissues and forces fluid back into the blood vessels. This keeps fluid from entering the tissues.  Restriction of fluid and salt intake.  Use of a water pill (diuretic). These medications are appropriate only for some types of edema. They pull fluid  out of your body and make you urinate more often. This gets rid of fluid and reduces swelling, but diuretics can have side effects. Only use diuretics as directed by your health care provider.  Follow these instructions at home:  Keep the affected body part above the level of your heart when you are lying down.  Do not sit still or stand for prolonged periods.  Do not put anything directly under your knees when lying down.  Do not wear constricting clothing or garters on your upper legs.  Exercise your legs to work the fluid back into your blood vessels. This may help the swelling go down.  Wear elastic bandages or support stockings to reduce ankle swelling as directed by your health care provider.  Eat a low-salt diet to reduce fluid if your health care provider recommends it.  Only take medicines as directed by your health care provider. Contact a health care provider if:  Your edema is not responding to treatment.  You have heart, liver, or kidney disease and notice symptoms of edema.  You have edema in your legs that does not improve after elevating them.  You have sudden and unexplained weight gain. Get help right away if:  You develop shortness of breath or chest pain.  You cannot breathe when you lie down.  You develop pain, redness, or warmth in the swollen areas.  You have heart, liver, or kidney disease and suddenly get edema.  You have a fever and your symptoms suddenly get worse. This information is   not intended to replace advice given to you by your health care provider. Make sure you discuss any questions you have with your health care provider. Document Released: 11/12/2005 Document Revised: 04/19/2016 Document Reviewed: 09/04/2013 Elsevier Interactive Patient Education  2017 Elsevier Inc.  

## 2018-02-04 NOTE — Assessment & Plan Note (Signed)
Pt requesting [facility] for [facility] management

## 2018-02-04 NOTE — Progress Notes (Deleted)
Patient ID: Stephanie Mckenzie, female    DOB: 06-Dec-1940  Age: 77 y.o. MRN: [id number]    Subjective:  Subjective  HPI Stephanie Mckenzie presents for c/o chronic pain and an swelling.  She stopped her lasix.  She is requesting the info for healthy weight and wellness again.   She also wants to go to [facility] because her sister goes there and likes it Nephrology wanted to do an ekg on her because of her edema and sob about pt states it was too late and they did not do it.   No cp  Review of Systems  Constitutional: Negative for activity change, appetite change, fatigue and unexpected weight change.  Respiratory: Positive for shortness of breath. Negative for cough.   Cardiovascular: Positive for leg swelling. Negative for chest pain and palpitations.  Psychiatric/Behavioral: Negative for behavioral problems and dysphoric mood. The patient is not nervous/anxious.     History Past Medical History:  Diagnosis Date  . CHF (congestive heart failure) ([place])   . Depression   . Edema 10/09/2015  . GERD (gastroesophageal reflux disease)   . High cholesterol   . Hypertension   . Migraine     She has a past surgical history that includes Cesarean section; Appendectomy; Parathyroidectomy; Laminectomy; Coronary angioplasty; and Abdominal hysterectomy.   Her family history includes COPD in her sister; Diabetes in her mother; Hypertension in her mother; Pancreatitis in her father; Thyroid disease in her mother.She reports that she quit smoking about 5 years ago. Her smoking use included cigarettes. She has a 30.00 pack-year smoking history. she has never used smokeless tobacco. She reports that she drinks alcohol. She reports that she does not use drugs.  Current Outpatient Medications on File Prior to Visit  Medication Sig Dispense Refill  . aspirin 81 MG tablet Take 81 mg by mouth daily.    Marland Kitchen atorvastatin (LIPITOR) 40 MG tablet TAKE ONE TABLET BY MOUTH ONCE DAILY 90 tablet 1  . Blood  Glucose Monitoring Suppl (ONE TOUCH ULTRA 2) w/Device KIT Use as directed to check blood sugar.   DXE11.9 1 each 0  . cetirizine (ZYRTEC) 10 MG tablet TAKE 1 TABLET BY MOUTH ONCE DAILY 30 tablet 2  . diltiazem (CARTIA XT) 180 MG 24 hr capsule Take 1 capsule (180 mg total) by mouth daily. 90 capsule 0  . FLUoxetine (PROZAC) 40 MG capsule Take 1 capsule by mouth daily.    . fluticasone (FLONASE) 50 MCG/ACT nasal spray Place 2 sprays into both nostrils daily. 16 g 6  . folic acid (FOLVITE) 1 MG tablet TAKE ONE TABLET BY MOUTH ONCE DAILY 30 tablet 11  . furosemide (LASIX) 40 MG tablet Take 1 tablet (40 mg total) by mouth daily. 30 tablet 3  . glipiZIDE (GLUCOTROL) 5 MG tablet TAKE ONE TABLET BY MOUTH ONCE DAILY BEFORE BREAKFAST 30 tablet 5  . glucose blood test strip Check blood sugar no more than three times daily. 300 each 5  . HYDROcodone-acetaminophen (NORCO/VICODIN) 5-325 MG tablet TK 1 T PO EVERY 4 HOURS PRN FOR PAIN 30 tablet 0  . hydrOXYzine (ATARAX/VISTARIL) 10 MG tablet Take 1 tab twice a day as needed for anxiety    . ibuprofen (ADVIL,MOTRIN) 800 MG tablet   0  . ipratropium (ATROVENT HFA) 17 MCG/ACT inhaler Inhale 2 puffs into the lungs every 6 (six) hours as needed for wheezing. 1 Inhaler 12  . metFORMIN (GLUCOPHAGE-XR) 500 MG 24 hr tablet TAKE 1 TABLET BY MOUTH ONCE DAILY  WITH BREAKFAST 90 tablet 0  . metoprolol succinate (TOPROL-XL) 50 MG 24 hr tablet TAKE ONE TABLET BY MOUTH TWICE DAILY 60 tablet 5  . mirtazapine (REMERON) 30 MG tablet Take 30 mg by mouth at bedtime.    . NONFORMULARY OR COMPOUNDED ITEM Compression stockings 20-30 mg hg #1 pair 1 each 0  . NYSTATIN powder APPLY  POWDER TOPICALLY UP TO 4 TIMES DAILY 56.7 g 1  . omeprazole (PRILOSEC) 40 MG capsule TAKE 1 CAPSULE BY MOUTH TWICE DAILY 180 capsule 1  . ONE TOUCH LANCETS MISC Check blood sugar no more than three times daily 300 each 5  . oxybutynin (DITROPAN XL) 15 MG 24 hr tablet Take 1 tablet (15 mg total) daily by mouth.  90 tablet 1  . potassium chloride (K-DUR) 10 MEQ tablet TAKE ONE TABLET BY MOUTH THREE TIMES DAILY 90 tablet 2  . pregabalin (LYRICA) 50 MG capsule Take 1 capsule (50 mg total) by mouth 3 (three) times daily. 21 capsule 0  . QUEtiapine (SEROQUEL) 25 MG tablet     . tiZANidine (ZANAFLEX) 4 MG tablet Take 1 tablet (4 mg total) by mouth every 6 (six) hours as needed for muscle spasms. 30 tablet 0  . torsemide (DEMADEX) 20 MG tablet     . traMADol (ULTRAM) 50 MG tablet Take 1 tablet (50 mg total) by mouth every 6 (six) hours as needed (for pain). 30 tablet 0  . Vitamin D, Ergocalciferol, (DRISDOL) 50000 units CAPS capsule Take 1 capsule (50,000 Units total) by mouth every 7 (seven) days. 6 capsule 0  . zolpidem (AMBIEN) 5 MG tablet Take 5 mg by mouth once.     No current facility-administered medications on file prior to visit.      Objective:  Objective  Physical Exam  Constitutional: She is oriented to person, place, and time. She appears well-developed and well-nourished.  HENT:  Head: Normocephalic and atraumatic.  Eyes: Conjunctivae and EOM are normal.  Neck: Normal range of motion. Neck supple. No JVD present. Carotid bruit is not present. No thyromegaly present.  Cardiovascular: Normal rate, regular rhythm and normal heart sounds.  No murmur heard. Pulmonary/Chest: Effort normal and breath sounds normal. No respiratory distress. She has no wheezes. She has no rales. She exhibits no tenderness.  Musculoskeletal: She exhibits edema.       Right ankle: She exhibits swelling.       Left ankle: She exhibits swelling.  Neurological: She is alert and oriented to person, place, and time.  Psychiatric: She has a normal mood and affect.  Nursing note and vitals reviewed.  BP 132/70 (BP Location: Left Arm, Patient Position: Sitting, Cuff Size: Large)   Pulse 87   Temp 98.1 F (36.7 C) (Oral)   Resp 14   Ht 5' 4.17" (1.63 m)   Wt 248 lb 3.2 oz (112.6 kg)   SpO2 96%   BMI 42.37 kg/m    Wt Readings from Last 3 Encounters:  02/04/18 248 lb 3.2 oz (112.6 kg)  01/28/18 240 lb (108.9 kg)  10/28/17 252 lb 2 oz (114.4 kg)     Lab Results  Component Value Date   WBC 7.4 11/05/2016   HGB 11.5 (L) 11/05/2016   HCT 35.4 (L) 11/05/2016   PLT 251.0 11/05/2016   GLUCOSE 90 11/05/2016   CHOL 139 03/07/2017   TRIG 196.0 (H) 03/07/2017   HDL 31.20 (L) 03/07/2017   LDLDIRECT 83.0 01/26/2016   LDLCALC 69 03/07/2017   ALT 15 11/05/2016  AST 14 11/05/2016   NA 140 11/05/2016   K 4.8 11/05/2016   CL 110 11/05/2016   CREATININE 0.94 11/05/2016   BUN 19 11/05/2016   CO2 22 11/05/2016   TSH 2.73 03/07/2017   INR 0.88 12/27/2009   HGBA1C 7.5 (H) 11/01/2016   MICROALBUR <0.7 06/21/2016    Mr Lumbar Spine W/o Contrast  Result Date: 06/14/2017 CLINICAL DATA:  Low back pain with sciatica EXAM: MRI LUMBAR SPINE WITHOUT CONTRAST TECHNIQUE: Multiplanar, multisequence MR imaging of the lumbar spine was performed. No intravenous contrast was administered. COMPARISON:  Lumbar radiographs 05/28/2017, lumbar MRI 01/05/2009 FINDINGS: Segmentation:  Normal Alignment: 5 mm retrolisthesis L5-S1, unchanged. Remaining alignment normal Vertebrae: Negative for fracture or mass lesion. Normal bone marrow Conus medullaris: Extends to the L2 level and appears normal. Paraspinal and other soft tissues: Negative Disc levels: L1-2:  Mild disc bulging without stenosis L2-3:  Mild disc bulging without stenosis L3-4:  Negative L4-5:  Mild disc and facet degeneration without stenosis L5-S1: Prior laminectomy on the right. 5 mm retrolisthesis. Disc degeneration with prominent diffuse endplate spurring similar to the prior study. Mild subarticular and foraminal stenosis bilaterally is similar to the prior study. IMPRESSION: Postop changes L5-S1. Disc degeneration and spondylosis L5-S1 similar to the prior study causing mild subarticular foraminal stenosis bilaterally. Mild degenerative changes elsewhere in the lumbar  spine similar to the prior study. Electronically Signed   By: Franchot Gallo M.D.   On: 06/14/2017 08:29     Assessment & Plan:  Plan  I have discontinued Doyle T. Rasheed's dicyclomine, Butalbital-APAP-Caffeine, and acetaminophen. I have also changed her diphenoxylate-atropine. Additionally, I am having her maintain her FLUoxetine, aspirin, zolpidem, mirtazapine, fluticasone, NONFORMULARY OR COMPOUNDED ITEM, folic acid, metoprolol succinate, Vitamin D (Ergocalciferol), ipratropium, traMADol, atorvastatin, glipiZIDE, torsemide, nystatin, ibuprofen, QUEtiapine, HYDROcodone-acetaminophen, tiZANidine, hydrOXYzine, potassium chloride, pregabalin, omeprazole, oxybutynin, glucose blood, ONE TOUCH LANCETS, ONE TOUCH ULTRA 2, furosemide, cetirizine, diltiazem, metFORMIN, and alendronate.  Meds ordered this encounter  Medications  . diphenoxylate-atropine (LOMOTIL) 2.5-0.025 MG tablet    Sig: Take 1 tablet by mouth 4 (four) times daily as needed for diarrhea or loose stools. Take 1 tablet by mouth 4 times a day as needed for Diarrhea.    Dispense:  30 tablet    Refill:  1  . alendronate (FOSAMAX) 70 MG tablet    Sig: Take 1 tablet (70 mg total) by mouth every 7 (seven) days. Take with a full glass of water on an empty stomach.    Dispense:  12 tablet    Refill:  3    Problem List Items Addressed This Visit      Unprioritized   Chronic pain syndrome   Relevant Orders   Ambulatory referral to Pain Clinic   Fibromyalgia    Pt requesting [facility]       Relevant Orders   Ambulatory referral to Pain Clinic   HLD (hyperlipidemia)    Encouraged heart healthy diet, increase exercise, avoid trans fats, consider a krill oil cap daily Pt stopped statin      IBS (irritable bowel syndrome) - Primary   Relevant Medications   diphenoxylate-atropine (LOMOTIL) 2.5-0.025 MG tablet   Neuropathy    Refer to pain management       OP (osteoporosis)   Relevant Medications    alendronate (FOSAMAX) 70 MG tablet   Other Relevant Orders   DG Bone Density    Other Visit Diagnoses    Hyperlipidemia LDL goal <100  Relevant Orders   Lipid panel   Comprehensive metabolic panel   Controlled type 2 diabetes mellitus with chronic kidney disease, without long-term current use of insulin, unspecified CKD stage (HCC)       Relevant Orders   Hemoglobin A1c   Comprehensive metabolic panel   Lower extremity edema       Relevant Orders   EKG 12-Lead (Completed)   DOE (dyspnea on exertion)       Relevant Orders   EKG 12-Lead (Completed)   Breast cancer screening       Relevant Orders   MM DIGITAL SCREENING BILATERAL      Follow-up: Return in about 2 weeks (around 02/18/2018), or if symptoms worsen or fail to improve, for f/u swelling.  Ann Held, DO

## 2018-02-04 NOTE — Assessment & Plan Note (Signed)
Refer to pain management 

## 2018-02-04 NOTE — Assessment & Plan Note (Signed)
Pt needs to f/u with her GI

## 2018-02-04 NOTE — Progress Notes (Signed)
Subjective:  I acted as a Education administrator for [employer]. Yancey Flemings, [place]   Patient ID: Stephanie Mckenzie, female    DOB: August 25, 1941, 77 y.o.   MRN: [id number]  Chief Complaint  Patient presents with  . Generalized Body Aches  . Foot Swelling    both feet swollen    HPI  Patient is in today for generalized body aches, and swollen feet.   She stopped taking her lasix She states nephrology had wanted to do a ekg but it was too late-- she denies cp , just c/o swelling and sob.    Patient Care Team: Carollee Herter, Alferd Apa, DO as PCP - General (Family Medicine) Sherryl Barters, MD as Referring Physician (Cardiology) Nyoka Cowden Marijean Heath, MD (Gastroenterology) Ezra Sites., MD (Endocrinology) Cameron Sprang, MD as Consulting Physician (Neurology) Cvejin, Sheppard Coil, MD as Referring Physician (Psychiatry)   Past Medical History:  Diagnosis Date  . CHF (congestive heart failure) ([place])   . Depression   . Edema 10/09/2015  . GERD (gastroesophageal reflux disease)   . High cholesterol   . Hypertension   . Migraine     Past Surgical History:  Procedure Laterality Date  . ABDOMINAL HYSTERECTOMY    . APPENDECTOMY    . CESAREAN SECTION     x 2  . CORONARY ANGIOPLASTY    . LAMINECTOMY    . PARATHYROIDECTOMY      Family History  Problem Relation Age of Onset  . Diabetes Mother   . Hypertension Mother   . Thyroid disease Mother   . Pancreatitis Father   . COPD Sister   . Rheumatologic disease Neg Hx     Social History   Socioeconomic History  . Marital status: Divorced    Spouse name: Not on file  . Number of children: 2  . Years of education: Not on file  . Highest education level: Not on file  Social Needs  . Financial resource strain: Not on file  . Food insecurity - worry: Not on file  . Food insecurity - inability: Not on file  . Transportation needs - medical: Not on file  . Transportation needs - non-medical: Not on file  Occupational History  .  Occupation: Retired  Tobacco Use  . Smoking status: Former Smoker    Packs/day: 1.00    Years: 30.00    Pack years: 30.00    Types: Cigarettes    Last attempt to quit: 11/26/2012    Years since quitting: 5.1  . Smokeless tobacco: Never Used  Substance and Sexual Activity  . Alcohol use: Yes    Alcohol/week: 0.0 oz    Comment: occasional  . Drug use: No  . Sexual activity: Not on file  Other Topics Concern  . Not on file  Social History Narrative   [facility] (12/07/16):   Originally from [place], [place]. Has always lived in [place]. Previous home/residence had "black mold". No bird exposure. 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 Has also worked for a Merchandiser, retail and serving food.     Outpatient Medications Prior to Visit  Medication Sig Dispense Refill  . aspirin 81 MG tablet Take 81 mg by mouth daily.    Marland Kitchen atorvastatin (LIPITOR) 40 MG tablet TAKE ONE TABLET BY MOUTH ONCE DAILY 90 tablet 1  . Blood Glucose Monitoring Suppl (ONE TOUCH ULTRA 2) w/Device KIT Use as directed to check blood sugar.   DXE11.9 1 each 0  .  cetirizine (ZYRTEC) 10 MG tablet TAKE 1 TABLET BY MOUTH ONCE DAILY 30 tablet 2  . diltiazem (CARTIA XT) 180 MG 24 hr capsule Take 1 capsule (180 mg total) by mouth daily. 90 capsule 0  . FLUoxetine (PROZAC) 40 MG capsule Take 1 capsule by mouth daily.    . fluticasone (FLONASE) 50 MCG/ACT nasal spray Place 2 sprays into both nostrils daily. 16 g 6  . folic acid (FOLVITE) 1 MG tablet TAKE ONE TABLET BY MOUTH ONCE DAILY 30 tablet 11  . furosemide (LASIX) 40 MG tablet Take 1 tablet (40 mg total) by mouth daily. 30 tablet 3  . glipiZIDE (GLUCOTROL) 5 MG tablet TAKE ONE TABLET BY MOUTH ONCE DAILY BEFORE BREAKFAST 30 tablet 5  . glucose blood test strip Check blood sugar no more than three times daily. 300 each 5  . HYDROcodone-acetaminophen (NORCO/VICODIN) 5-325 MG tablet TK 1 T PO EVERY 4 HOURS PRN FOR PAIN 30 tablet 0  .  hydrOXYzine (ATARAX/VISTARIL) 10 MG tablet Take 1 tab twice a day as needed for anxiety    . ibuprofen (ADVIL,MOTRIN) 800 MG tablet   0  . ipratropium (ATROVENT HFA) 17 MCG/ACT inhaler Inhale 2 puffs into the lungs every 6 (six) hours as needed for wheezing. 1 Inhaler 12  . metFORMIN (GLUCOPHAGE-XR) 500 MG 24 hr tablet TAKE 1 TABLET BY MOUTH ONCE DAILY WITH BREAKFAST 90 tablet 0  . metoprolol succinate (TOPROL-XL) 50 MG 24 hr tablet TAKE ONE TABLET BY MOUTH TWICE DAILY 60 tablet 5  . mirtazapine (REMERON) 30 MG tablet Take 30 mg by mouth at bedtime.    . NONFORMULARY OR COMPOUNDED ITEM Compression stockings 20-30 mg hg #1 pair 1 each 0  . NYSTATIN powder APPLY  POWDER TOPICALLY UP TO 4 TIMES DAILY 56.7 g 1  . omeprazole (PRILOSEC) 40 MG capsule TAKE 1 CAPSULE BY MOUTH TWICE DAILY 180 capsule 1  . ONE TOUCH LANCETS MISC Check blood sugar no more than three times daily 300 each 5  . oxybutynin (DITROPAN XL) 15 MG 24 hr tablet Take 1 tablet (15 mg total) daily by mouth. 90 tablet 1  . potassium chloride (K-DUR) 10 MEQ tablet TAKE ONE TABLET BY MOUTH THREE TIMES DAILY 90 tablet 2  . pregabalin (LYRICA) 50 MG capsule Take 1 capsule (50 mg total) by mouth 3 (three) times daily. 21 capsule 0  . QUEtiapine (SEROQUEL) 25 MG tablet     . tiZANidine (ZANAFLEX) 4 MG tablet Take 1 tablet (4 mg total) by mouth every 6 (six) hours as needed for muscle spasms. 30 tablet 0  . torsemide (DEMADEX) 20 MG tablet     . traMADol (ULTRAM) 50 MG tablet Take 1 tablet (50 mg total) by mouth every 6 (six) hours as needed (for pain). 30 tablet 0  . Vitamin D, Ergocalciferol, (DRISDOL) 50000 units CAPS capsule Take 1 capsule (50,000 Units total) by mouth every 7 (seven) days. 6 capsule 0  . zolpidem (AMBIEN) 5 MG tablet Take 5 mg by mouth once.    Marland Kitchen acetaminophen (TYLENOL) 325 MG tablet Take 1 tablet by mouth every 4 hours as needed for pain.    Marland Kitchen alendronate (FOSAMAX) 70 MG tablet Take 1 tablet (70 mg total) by mouth every 7  (seven) days. Take with a full glass of water on an empty stomach. 4 tablet 11  . Butalbital-APAP-Caffeine (FIORICET) 50-300-40 MG CAPS Take 1 capsule every 6 hours as needed oral for headache/knee pain.    Marland Kitchen dicyclomine (BENTYL) 10  MG capsule Take 10 mg by mouth 3 (three) times daily.    . diphenoxylate-atropine (LOMOTIL) 2.5-0.025 MG tablet Take by mouth. Take 1 tablet by mouth 4 times a day as needed for Diarrhea.     No facility-administered medications prior to visit.     Allergies  Allergen Reactions  . Dilaudid [Hydromorphone] Other (See Comments)    Per daughter cause confusion and hallucinations. Causes confusion and hallucinations per pt's daughter  . Lorazepam Rash and Other (See Comments)    Other reaction(s): Hallucinations hallucinations  hallucinations  hallucinations   . Morphine And Related Itching  . Acyclovir And Related   . Amlodipine Besylate     Pedal edema  . Buprenorphine Hcl Itching    ROS     Objective:    Physical Exam  Constitutional: She is oriented to person, place, and time. She appears well-developed and well-nourished.  HENT:  Head: Normocephalic and atraumatic.  Eyes: Conjunctivae and EOM are normal.  Neck: Normal range of motion. Neck supple. No JVD present. Carotid bruit is not present. No thyromegaly present.  Cardiovascular: Normal rate, regular rhythm and normal heart sounds.  No murmur heard. Pulmonary/Chest: Effort normal and breath sounds normal. No respiratory distress. She has no wheezes. She has no rales. She exhibits no tenderness.  Musculoskeletal: She exhibits edema.       Right foot: There is swelling.       Left foot: There is swelling.  Neurological: She is alert and oriented to person, place, and time.  Psychiatric: She has a normal mood and affect.  Nursing note and vitals reviewed.   BP 132/70 (BP Location: Left Arm, Patient Position: Sitting, Cuff Size: Large)   Pulse 87   Temp 98.1 F (36.7 C) (Oral)   Resp 14    Ht 5' 4.17" (1.63 m)   Wt 248 lb 3.2 oz (112.6 kg)   SpO2 96%   BMI 42.37 kg/m  Wt Readings from Last 3 Encounters:  02/04/18 248 lb 3.2 oz (112.6 kg)  01/28/18 240 lb (108.9 kg)  10/28/17 252 lb 2 oz (114.4 kg)   BP Readings from Last 3 Encounters:  02/04/18 132/70  10/28/17 (!) 144/86  08/19/17 (!) 170/88     Immunization History  Administered Date(s) Administered  . Tdap 10/23/2012    Health Maintenance  Topic Date Due  . FOOT EXAM  06/25/1953  . OPHTHALMOLOGY EXAM  06/25/1953  . PNA vac Low Risk Adult (2 of 2 - PPSV23) 06/16/2016  . HEMOGLOBIN A1C  05/02/2017  . URINE MICROALBUMIN  06/21/2017  . MAMMOGRAM  01/22/2018  . INFLUENZA VACCINE  10/07/2024 (Originally 06/26/2017)  . TETANUS/TDAP  10/23/2022  . COLONOSCOPY  09/15/2024  . DEXA SCAN  Completed    Lab Results  Component Value Date   WBC 7.4 11/05/2016   HGB 11.5 (L) 11/05/2016   HCT 35.4 (L) 11/05/2016   PLT 251.0 11/05/2016   GLUCOSE 90 11/05/2016   CHOL 139 03/07/2017   TRIG 196.0 (H) 03/07/2017   HDL 31.20 (L) 03/07/2017   LDLDIRECT 83.0 01/26/2016   LDLCALC 69 03/07/2017   ALT 15 11/05/2016   AST 14 11/05/2016   NA 140 11/05/2016   K 4.8 11/05/2016   CL 110 11/05/2016   CREATININE 0.94 11/05/2016   BUN 19 11/05/2016   CO2 22 11/05/2016   TSH 2.73 03/07/2017   INR 0.88 12/27/2009   HGBA1C 7.5 (H) 11/01/2016   MICROALBUR <0.7 06/21/2016    Lab Results  Component  Value Date   TSH 2.73 03/07/2017   Lab Results  Component Value Date   WBC 7.4 11/05/2016   HGB 11.5 (L) 11/05/2016   HCT 35.4 (L) 11/05/2016   MCV 89.9 11/05/2016   PLT 251.0 11/05/2016   Lab Results  Component Value Date   NA 140 11/05/2016   K 4.8 11/05/2016   CO2 22 11/05/2016   GLUCOSE 90 11/05/2016   BUN 19 11/05/2016   CREATININE 0.94 11/05/2016   BILITOT 0.3 11/05/2016   ALKPHOS 79 11/05/2016   AST 14 11/05/2016   ALT 15 11/05/2016   PROT 6.3 11/05/2016   ALBUMIN 3.7 11/05/2016   CALCIUM 9.0 11/05/2016     ANIONGAP 8 09/14/2016   GFR 61.98 11/05/2016   Lab Results  Component Value Date   CHOL 139 03/07/2017   Lab Results  Component Value Date   HDL 31.20 (L) 03/07/2017   Lab Results  Component Value Date   LDLCALC 69 03/07/2017   Lab Results  Component Value Date   TRIG 196.0 (H) 03/07/2017   Lab Results  Component Value Date   CHOLHDL 4 03/07/2017   Lab Results  Component Value Date   HGBA1C 7.5 (H) 11/01/2016         Assessment & Plan:   Problem List Items Addressed This Visit      Unprioritized   Chronic pain syndrome   Relevant Orders   Ambulatory referral to Pain Clinic   Fibromyalgia    Pt requesting [facility] for [facility] management       Relevant Orders   Ambulatory referral to Pain Clinic   HLD (hyperlipidemia)    Encouraged heart healthy diet, increase exercise, avoid trans fats, consider a krill oil cap daily Pt stopped statin      IBS (irritable bowel syndrome) - Primary   Relevant Medications   diphenoxylate-atropine (LOMOTIL) 2.5-0.025 MG tablet   Neuropathy    Refer to pain management       OP (osteoporosis)   Relevant Medications   alendronate (FOSAMAX) 70 MG tablet   Other Relevant Orders   DG Bone Density    Other Visit Diagnoses    Hyperlipidemia LDL goal <100       Relevant Orders   Lipid panel   Comprehensive metabolic panel   Controlled type 2 diabetes mellitus with chronic kidney disease, without long-term current use of insulin, unspecified CKD stage (HCC)       Relevant Orders   Hemoglobin A1c   Comprehensive metabolic panel   Lower extremity edema       Relevant Orders   EKG 12-Lead (Completed)   DOE (dyspnea on exertion)       Relevant Orders   EKG 12-Lead (Completed)   Breast cancer screening       Relevant Orders   MM DIGITAL SCREENING BILATERAL      I have discontinued Charlett Nose T. Mages's dicyclomine, Butalbital-APAP-Caffeine, and acetaminophen. I have also changed her diphenoxylate-atropine.  Additionally, I am having her maintain her FLUoxetine, aspirin, zolpidem, mirtazapine, fluticasone, NONFORMULARY OR COMPOUNDED ITEM, folic acid, metoprolol succinate, Vitamin D (Ergocalciferol), ipratropium, traMADol, atorvastatin, glipiZIDE, torsemide, nystatin, ibuprofen, QUEtiapine, HYDROcodone-acetaminophen, tiZANidine, hydrOXYzine, potassium chloride, pregabalin, omeprazole, oxybutynin, glucose blood, ONE TOUCH LANCETS, ONE TOUCH ULTRA 2, furosemide, cetirizine, diltiazem, metFORMIN, and alendronate.  Meds ordered this encounter  Medications  . diphenoxylate-atropine (LOMOTIL) 2.5-0.025 MG tablet    Sig: Take 1 tablet by mouth 4 (four) times daily as needed for diarrhea or loose stools. Take 1 tablet  by mouth 4 times a day as needed for Diarrhea.    Dispense:  30 tablet    Refill:  1  . alendronate (FOSAMAX) 70 MG tablet    Sig: Take 1 tablet (70 mg total) by mouth every 7 (seven) days. Take with a full glass of water on an empty stomach.    Dispense:  12 tablet    Refill:  3    CMA served as scribe during this visit. History, Physical and Plan performed by medical provider. Documentation and orders reviewed and attested to.  Ann Held, DO

## 2018-02-05 LAB — LIPID PANEL
Cholesterol: 231 mg/dL — ABNORMAL HIGH (ref 0–200)
HDL: 37.5 mg/dL — ABNORMAL LOW (ref >39.00)
NonHDL: 193.72
Total CHOL/HDL Ratio: 6
Triglycerides: 381 mg/dL — ABNORMAL HIGH (ref 0.0–149.0)
VLDL: 76.2 mg/dL — AB (ref 0.0–40.0)

## 2018-02-05 LAB — COMPREHENSIVE METABOLIC PANEL
ALT: 13 U/L (ref 0–35)
AST: 12 U/L (ref 0–37)
Albumin: 3.7 g/dL (ref 3.5–5.2)
Alkaline Phosphatase: 76 U/L (ref 39–117)
BUN: 22 mg/dL (ref 6–23)
CO2: 23 mEq/L (ref 19–32)
CREATININE: 0.97 mg/dL (ref 0.40–1.20)
Calcium: 9.7 mg/dL (ref 8.4–10.5)
Chloride: 106 mEq/L (ref 96–112)
GFR: 59.57 mL/min — ABNORMAL LOW (ref >60.00)
GLUCOSE: 62 mg/dL — AB (ref 70–99)
POTASSIUM: 3.9 meq/L (ref 3.5–5.1)
SODIUM: 138 meq/L (ref 135–145)
Total Bilirubin: 0.3 mg/dL (ref 0.2–1.2)
Total Protein: 6.4 g/dL (ref 6.0–8.3)

## 2018-02-05 LAB — LDL CHOLESTEROL, DIRECT: Direct LDL: 160 mg/dL

## 2018-02-05 LAB — HEMOGLOBIN A1C: HEMOGLOBIN A1C: 6 % (ref 4.6–6.5)

## 2018-02-12 DIAGNOSIS — M2550 Pain in unspecified joint: Secondary | ICD-10-CM | POA: Diagnosis not present

## 2018-02-12 DIAGNOSIS — S62347D Nondisplaced fracture of base of fifth metacarpal bone. left hand, subsequent encounter for fracture with routine healing: Secondary | ICD-10-CM | POA: Diagnosis not present

## 2018-02-12 DIAGNOSIS — S62357D Nondisplaced fracture of shaft of fifth metacarpal bone, left hand, subsequent encounter for fracture with routine healing: Secondary | ICD-10-CM | POA: Diagnosis not present

## 2018-02-12 DIAGNOSIS — M19049 Primary osteoarthritis, unspecified hand: Secondary | ICD-10-CM | POA: Diagnosis not present

## 2018-02-13 ENCOUNTER — Other Ambulatory Visit: Payer: Self-pay | Admitting: Family Medicine

## 2018-02-13 DIAGNOSIS — E1165 Type 2 diabetes mellitus with hyperglycemia: Principal | ICD-10-CM

## 2018-02-13 DIAGNOSIS — E1151 Type 2 diabetes mellitus with diabetic peripheral angiopathy without gangrene: Secondary | ICD-10-CM

## 2018-02-13 DIAGNOSIS — IMO0002 Reserved for concepts with insufficient information to code with codable children: Secondary | ICD-10-CM

## 2018-02-17 DIAGNOSIS — M2550 Pain in unspecified joint: Secondary | ICD-10-CM | POA: Diagnosis not present

## 2018-02-17 DIAGNOSIS — G894 Chronic pain syndrome: Secondary | ICD-10-CM | POA: Diagnosis not present

## 2018-02-17 DIAGNOSIS — M797 Fibromyalgia: Secondary | ICD-10-CM | POA: Diagnosis not present

## 2018-02-18 ENCOUNTER — Ambulatory Visit: Payer: PPO | Admitting: Family Medicine

## 2018-02-23 ENCOUNTER — Other Ambulatory Visit: Payer: Self-pay | Admitting: Family Medicine

## 2018-02-24 DIAGNOSIS — R300 Dysuria: Secondary | ICD-10-CM | POA: Diagnosis not present

## 2018-02-24 DIAGNOSIS — N390 Urinary tract infection, site not specified: Secondary | ICD-10-CM | POA: Diagnosis not present

## 2018-02-25 DIAGNOSIS — F331 Major depressive disorder, recurrent, moderate: Secondary | ICD-10-CM | POA: Diagnosis not present

## 2018-02-25 DIAGNOSIS — F4323 Adjustment disorder with mixed anxiety and depressed mood: Secondary | ICD-10-CM | POA: Diagnosis not present

## 2018-02-28 DIAGNOSIS — M79642 Pain in left hand: Secondary | ICD-10-CM | POA: Diagnosis not present

## 2018-02-28 DIAGNOSIS — M19049 Primary osteoarthritis, unspecified hand: Secondary | ICD-10-CM | POA: Diagnosis not present

## 2018-02-28 DIAGNOSIS — S62307S Unspecified fracture of fifth metacarpal bone, left hand, sequela: Secondary | ICD-10-CM | POA: Diagnosis not present

## 2018-02-28 DIAGNOSIS — M25642 Stiffness of left hand, not elsewhere classified: Secondary | ICD-10-CM | POA: Diagnosis not present

## 2018-03-05 DIAGNOSIS — G894 Chronic pain syndrome: Secondary | ICD-10-CM | POA: Diagnosis not present

## 2018-03-05 DIAGNOSIS — E0842 Diabetes mellitus due to underlying condition with diabetic polyneuropathy: Secondary | ICD-10-CM | POA: Diagnosis not present

## 2018-03-05 DIAGNOSIS — M47816 Spondylosis without myelopathy or radiculopathy, lumbar region: Secondary | ICD-10-CM | POA: Diagnosis not present

## 2018-03-05 DIAGNOSIS — M797 Fibromyalgia: Secondary | ICD-10-CM | POA: Diagnosis not present

## 2018-03-10 ENCOUNTER — Telehealth: Payer: Self-pay | Admitting: *Deleted

## 2018-03-10 DIAGNOSIS — E1165 Type 2 diabetes mellitus with hyperglycemia: Secondary | ICD-10-CM | POA: Diagnosis not present

## 2018-03-10 DIAGNOSIS — E669 Obesity, unspecified: Secondary | ICD-10-CM | POA: Diagnosis not present

## 2018-03-10 DIAGNOSIS — I130 Hypertensive heart and chronic kidney disease with heart failure and stage 1 through stage 4 chronic kidney disease, or unspecified chronic kidney disease: Secondary | ICD-10-CM | POA: Diagnosis not present

## 2018-03-10 DIAGNOSIS — E785 Hyperlipidemia, unspecified: Secondary | ICD-10-CM | POA: Diagnosis not present

## 2018-03-10 DIAGNOSIS — N183 Chronic kidney disease, stage 3 (moderate): Secondary | ICD-10-CM | POA: Diagnosis not present

## 2018-03-10 DIAGNOSIS — I509 Heart failure, unspecified: Secondary | ICD-10-CM | POA: Diagnosis not present

## 2018-03-10 DIAGNOSIS — I5032 Chronic diastolic (congestive) heart failure: Secondary | ICD-10-CM | POA: Diagnosis not present

## 2018-03-10 DIAGNOSIS — N189 Chronic kidney disease, unspecified: Secondary | ICD-10-CM | POA: Diagnosis not present

## 2018-03-10 DIAGNOSIS — K219 Gastro-esophageal reflux disease without esophagitis: Secondary | ICD-10-CM | POA: Diagnosis not present

## 2018-03-10 DIAGNOSIS — E1169 Type 2 diabetes mellitus with other specified complication: Secondary | ICD-10-CM | POA: Diagnosis not present

## 2018-03-10 NOTE — Telephone Encounter (Signed)
Received request for Medical Records from [facility]; forwarded to [place] for email/scan/SLS 04/15

## 2018-03-11 DIAGNOSIS — R3981 Functional urinary incontinence: Secondary | ICD-10-CM | POA: Diagnosis not present

## 2018-03-12 DIAGNOSIS — R05 Cough: Secondary | ICD-10-CM | POA: Diagnosis not present

## 2018-03-12 DIAGNOSIS — R062 Wheezing: Secondary | ICD-10-CM | POA: Diagnosis not present

## 2018-03-12 DIAGNOSIS — J0100 Acute maxillary sinusitis, unspecified: Secondary | ICD-10-CM | POA: Diagnosis not present

## 2018-03-24 DIAGNOSIS — E1165 Type 2 diabetes mellitus with hyperglycemia: Secondary | ICD-10-CM | POA: Diagnosis not present

## 2018-03-24 DIAGNOSIS — I129 Hypertensive chronic kidney disease with stage 1 through stage 4 chronic kidney disease, or unspecified chronic kidney disease: Secondary | ICD-10-CM | POA: Diagnosis not present

## 2018-03-24 DIAGNOSIS — E559 Vitamin D deficiency, unspecified: Secondary | ICD-10-CM | POA: Diagnosis not present

## 2018-03-24 DIAGNOSIS — G4733 Obstructive sleep apnea (adult) (pediatric): Secondary | ICD-10-CM | POA: Diagnosis not present

## 2018-03-24 DIAGNOSIS — N189 Chronic kidney disease, unspecified: Secondary | ICD-10-CM | POA: Diagnosis not present

## 2018-03-24 DIAGNOSIS — I110 Hypertensive heart disease with heart failure: Secondary | ICD-10-CM | POA: Diagnosis not present

## 2018-03-24 DIAGNOSIS — E785 Hyperlipidemia, unspecified: Secondary | ICD-10-CM | POA: Diagnosis not present

## 2018-03-28 DIAGNOSIS — R3981 Functional urinary incontinence: Secondary | ICD-10-CM | POA: Diagnosis not present

## 2018-04-01 ENCOUNTER — Other Ambulatory Visit (HOSPITAL_BASED_OUTPATIENT_CLINIC_OR_DEPARTMENT_OTHER): Payer: PPO

## 2018-04-01 ENCOUNTER — Ambulatory Visit (HOSPITAL_BASED_OUTPATIENT_CLINIC_OR_DEPARTMENT_OTHER): Payer: PPO

## 2018-04-02 DIAGNOSIS — M47816 Spondylosis without myelopathy or radiculopathy, lumbar region: Secondary | ICD-10-CM | POA: Diagnosis not present

## 2018-04-02 DIAGNOSIS — M797 Fibromyalgia: Secondary | ICD-10-CM | POA: Diagnosis not present

## 2018-04-02 DIAGNOSIS — M461 Sacroiliitis, not elsewhere classified: Secondary | ICD-10-CM | POA: Diagnosis not present

## 2018-04-02 DIAGNOSIS — G894 Chronic pain syndrome: Secondary | ICD-10-CM | POA: Diagnosis not present

## 2018-04-14 ENCOUNTER — Telehealth: Payer: Self-pay | Admitting: *Deleted

## 2018-04-14 NOTE — Telephone Encounter (Signed)
Received request for Medical Records from [facility]/Dr. Jillyn Ledger; forwarded to [place] for email/scan/SLS 05/20

## 2018-04-23 ENCOUNTER — Telehealth: Payer: Self-pay | Admitting: *Deleted

## 2018-04-23 NOTE — Telephone Encounter (Signed)
Received request for Medical Records from [facility]; forwarded to [place] for email/scan/SLS 05/29

## 2018-05-10 ENCOUNTER — Other Ambulatory Visit: Payer: Self-pay | Admitting: Family Medicine

## 2018-05-14 DIAGNOSIS — M797 Fibromyalgia: Secondary | ICD-10-CM | POA: Diagnosis not present

## 2018-05-14 DIAGNOSIS — M461 Sacroiliitis, not elsewhere classified: Secondary | ICD-10-CM | POA: Diagnosis not present

## 2018-05-14 DIAGNOSIS — M47816 Spondylosis without myelopathy or radiculopathy, lumbar region: Secondary | ICD-10-CM | POA: Diagnosis not present

## 2018-05-14 DIAGNOSIS — G894 Chronic pain syndrome: Secondary | ICD-10-CM | POA: Diagnosis not present

## 2018-06-05 DIAGNOSIS — L304 Erythema intertrigo: Secondary | ICD-10-CM | POA: Diagnosis not present

## 2018-06-05 DIAGNOSIS — R600 Localized edema: Secondary | ICD-10-CM | POA: Diagnosis not present

## 2018-06-05 DIAGNOSIS — I872 Venous insufficiency (chronic) (peripheral): Secondary | ICD-10-CM | POA: Diagnosis not present

## 2018-06-05 DIAGNOSIS — I5032 Chronic diastolic (congestive) heart failure: Secondary | ICD-10-CM | POA: Diagnosis not present

## 2018-06-05 DIAGNOSIS — R0602 Shortness of breath: Secondary | ICD-10-CM | POA: Diagnosis not present

## 2018-06-09 ENCOUNTER — Other Ambulatory Visit: Payer: Self-pay | Admitting: Family Medicine

## 2018-06-09 DIAGNOSIS — E1165 Type 2 diabetes mellitus with hyperglycemia: Principal | ICD-10-CM

## 2018-06-09 DIAGNOSIS — E1151 Type 2 diabetes mellitus with diabetic peripheral angiopathy without gangrene: Secondary | ICD-10-CM

## 2018-06-09 DIAGNOSIS — IMO0002 Reserved for concepts with insufficient information to code with codable children: Secondary | ICD-10-CM

## 2018-06-10 DIAGNOSIS — I5032 Chronic diastolic (congestive) heart failure: Secondary | ICD-10-CM | POA: Diagnosis not present

## 2018-06-16 DIAGNOSIS — R0602 Shortness of breath: Secondary | ICD-10-CM | POA: Diagnosis not present

## 2018-06-16 DIAGNOSIS — I129 Hypertensive chronic kidney disease with stage 1 through stage 4 chronic kidney disease, or unspecified chronic kidney disease: Secondary | ICD-10-CM | POA: Diagnosis not present

## 2018-06-16 DIAGNOSIS — N189 Chronic kidney disease, unspecified: Secondary | ICD-10-CM | POA: Diagnosis not present

## 2018-06-16 DIAGNOSIS — G4733 Obstructive sleep apnea (adult) (pediatric): Secondary | ICD-10-CM | POA: Diagnosis not present

## 2018-06-16 DIAGNOSIS — R0789 Other chest pain: Secondary | ICD-10-CM | POA: Diagnosis not present

## 2018-06-16 DIAGNOSIS — E1165 Type 2 diabetes mellitus with hyperglycemia: Secondary | ICD-10-CM | POA: Diagnosis not present

## 2018-06-16 DIAGNOSIS — E782 Mixed hyperlipidemia: Secondary | ICD-10-CM | POA: Diagnosis not present

## 2018-06-16 DIAGNOSIS — Z7982 Long term (current) use of aspirin: Secondary | ICD-10-CM | POA: Diagnosis not present

## 2018-06-16 DIAGNOSIS — I2510 Atherosclerotic heart disease of native coronary artery without angina pectoris: Secondary | ICD-10-CM | POA: Diagnosis not present

## 2018-06-16 DIAGNOSIS — Z87891 Personal history of nicotine dependence: Secondary | ICD-10-CM | POA: Diagnosis not present

## 2018-06-19 DIAGNOSIS — K589 Irritable bowel syndrome without diarrhea: Secondary | ICD-10-CM | POA: Diagnosis not present

## 2018-06-24 DIAGNOSIS — R600 Localized edema: Secondary | ICD-10-CM | POA: Diagnosis not present

## 2018-06-24 DIAGNOSIS — I5032 Chronic diastolic (congestive) heart failure: Secondary | ICD-10-CM | POA: Diagnosis not present

## 2018-07-01 DIAGNOSIS — N3592 Unspecified urethral stricture, female: Secondary | ICD-10-CM | POA: Diagnosis not present

## 2018-07-02 ENCOUNTER — Ambulatory Visit: Payer: PPO | Admitting: Neurology

## 2018-07-02 ENCOUNTER — Encounter

## 2018-07-02 DIAGNOSIS — R0602 Shortness of breath: Secondary | ICD-10-CM | POA: Diagnosis not present

## 2018-07-02 DIAGNOSIS — M7989 Other specified soft tissue disorders: Secondary | ICD-10-CM | POA: Diagnosis not present

## 2018-07-03 DIAGNOSIS — R062 Wheezing: Secondary | ICD-10-CM | POA: Diagnosis not present

## 2018-07-03 DIAGNOSIS — R609 Edema, unspecified: Secondary | ICD-10-CM | POA: Diagnosis not present

## 2018-07-03 DIAGNOSIS — E119 Type 2 diabetes mellitus without complications: Secondary | ICD-10-CM | POA: Diagnosis not present

## 2018-07-03 DIAGNOSIS — F329 Major depressive disorder, single episode, unspecified: Secondary | ICD-10-CM | POA: Diagnosis not present

## 2018-07-03 DIAGNOSIS — D631 Anemia in chronic kidney disease: Secondary | ICD-10-CM | POA: Diagnosis not present

## 2018-07-03 DIAGNOSIS — I959 Hypotension, unspecified: Secondary | ICD-10-CM | POA: Diagnosis not present

## 2018-07-03 DIAGNOSIS — I2510 Atherosclerotic heart disease of native coronary artery without angina pectoris: Secondary | ICD-10-CM | POA: Diagnosis not present

## 2018-07-03 DIAGNOSIS — I083 Combined rheumatic disorders of mitral, aortic and tricuspid valves: Secondary | ICD-10-CM | POA: Diagnosis not present

## 2018-07-03 DIAGNOSIS — I5033 Acute on chronic diastolic (congestive) heart failure: Secondary | ICD-10-CM | POA: Diagnosis not present

## 2018-07-03 DIAGNOSIS — Z9981 Dependence on supplemental oxygen: Secondary | ICD-10-CM | POA: Diagnosis not present

## 2018-07-03 DIAGNOSIS — I272 Pulmonary hypertension, unspecified: Secondary | ICD-10-CM | POA: Diagnosis not present

## 2018-07-03 DIAGNOSIS — E785 Hyperlipidemia, unspecified: Secondary | ICD-10-CM | POA: Diagnosis not present

## 2018-07-03 DIAGNOSIS — R740 Nonspecific elevation of levels of transaminase and lactic acid dehydrogenase [LDH]: Secondary | ICD-10-CM | POA: Diagnosis not present

## 2018-07-03 DIAGNOSIS — F419 Anxiety disorder, unspecified: Secondary | ICD-10-CM | POA: Diagnosis not present

## 2018-07-03 DIAGNOSIS — Z6841 Body Mass Index (BMI) 40.0 and over, adult: Secondary | ICD-10-CM | POA: Diagnosis not present

## 2018-07-03 DIAGNOSIS — R0601 Orthopnea: Secondary | ICD-10-CM | POA: Diagnosis not present

## 2018-07-03 DIAGNOSIS — J9601 Acute respiratory failure with hypoxia: Secondary | ICD-10-CM | POA: Diagnosis not present

## 2018-07-03 DIAGNOSIS — E872 Acidosis: Secondary | ICD-10-CM | POA: Diagnosis not present

## 2018-07-03 DIAGNOSIS — I130 Hypertensive heart and chronic kidney disease with heart failure and stage 1 through stage 4 chronic kidney disease, or unspecified chronic kidney disease: Secondary | ICD-10-CM | POA: Diagnosis not present

## 2018-07-03 DIAGNOSIS — M1990 Unspecified osteoarthritis, unspecified site: Secondary | ICD-10-CM | POA: Diagnosis not present

## 2018-07-03 DIAGNOSIS — J441 Chronic obstructive pulmonary disease with (acute) exacerbation: Secondary | ICD-10-CM | POA: Diagnosis not present

## 2018-07-03 DIAGNOSIS — I110 Hypertensive heart disease with heart failure: Secondary | ICD-10-CM | POA: Diagnosis not present

## 2018-07-03 DIAGNOSIS — Z87891 Personal history of nicotine dependence: Secondary | ICD-10-CM | POA: Diagnosis not present

## 2018-07-03 DIAGNOSIS — N179 Acute kidney failure, unspecified: Secondary | ICD-10-CM | POA: Diagnosis not present

## 2018-07-03 DIAGNOSIS — K589 Irritable bowel syndrome without diarrhea: Secondary | ICD-10-CM | POA: Diagnosis not present

## 2018-07-03 DIAGNOSIS — K219 Gastro-esophageal reflux disease without esophagitis: Secondary | ICD-10-CM | POA: Diagnosis not present

## 2018-07-03 DIAGNOSIS — M810 Age-related osteoporosis without current pathological fracture: Secondary | ICD-10-CM | POA: Diagnosis not present

## 2018-07-03 DIAGNOSIS — R079 Chest pain, unspecified: Secondary | ICD-10-CM | POA: Diagnosis not present

## 2018-07-03 DIAGNOSIS — R600 Localized edema: Secondary | ICD-10-CM | POA: Diagnosis not present

## 2018-07-03 DIAGNOSIS — H9193 Unspecified hearing loss, bilateral: Secondary | ICD-10-CM | POA: Diagnosis not present

## 2018-07-03 DIAGNOSIS — Z66 Do not resuscitate: Secondary | ICD-10-CM | POA: Diagnosis not present

## 2018-07-03 DIAGNOSIS — I509 Heart failure, unspecified: Secondary | ICD-10-CM | POA: Diagnosis not present

## 2018-07-03 DIAGNOSIS — E1140 Type 2 diabetes mellitus with diabetic neuropathy, unspecified: Secondary | ICD-10-CM | POA: Diagnosis not present

## 2018-07-03 DIAGNOSIS — R0902 Hypoxemia: Secondary | ICD-10-CM | POA: Diagnosis not present

## 2018-07-03 DIAGNOSIS — I517 Cardiomegaly: Secondary | ICD-10-CM | POA: Diagnosis not present

## 2018-07-03 DIAGNOSIS — R0609 Other forms of dyspnea: Secondary | ICD-10-CM | POA: Diagnosis not present

## 2018-07-03 DIAGNOSIS — E1122 Type 2 diabetes mellitus with diabetic chronic kidney disease: Secondary | ICD-10-CM | POA: Diagnosis not present

## 2018-07-03 DIAGNOSIS — J984 Other disorders of lung: Secondary | ICD-10-CM | POA: Diagnosis not present

## 2018-07-03 DIAGNOSIS — E669 Obesity, unspecified: Secondary | ICD-10-CM | POA: Diagnosis not present

## 2018-07-03 DIAGNOSIS — I270 Primary pulmonary hypertension: Secondary | ICD-10-CM | POA: Diagnosis not present

## 2018-07-03 DIAGNOSIS — T501X5A Adverse effect of loop [high-ceiling] diuretics, initial encounter: Secondary | ICD-10-CM | POA: Diagnosis not present

## 2018-07-03 DIAGNOSIS — G4733 Obstructive sleep apnea (adult) (pediatric): Secondary | ICD-10-CM | POA: Diagnosis not present

## 2018-07-03 DIAGNOSIS — R0602 Shortness of breath: Secondary | ICD-10-CM | POA: Diagnosis not present

## 2018-07-03 DIAGNOSIS — N183 Chronic kidney disease, stage 3 (moderate): Secondary | ICD-10-CM | POA: Diagnosis not present

## 2018-07-03 DIAGNOSIS — R51 Headache: Secondary | ICD-10-CM | POA: Diagnosis not present

## 2018-07-03 DIAGNOSIS — J449 Chronic obstructive pulmonary disease, unspecified: Secondary | ICD-10-CM | POA: Diagnosis not present

## 2018-07-07 NOTE — Telephone Encounter (Signed)
This encounter was created in error - please disregard.

## 2018-07-08 ENCOUNTER — Other Ambulatory Visit: Payer: Self-pay

## 2018-07-08 NOTE — Patient Outreach (Signed)
[place] [facility]) Care Management  07/08/2018  Stephanie Mckenzie [birth year] [id number]  TELEPHONE SCREENING Referral date: 07/08/18 Referral source: HTA concierge Referral reason: assistance with stress test copay Insurance: Heath team advantage  Telephone call to patient regarding HTA concierge referral. HIPAA verified with patient. Explained reason for call. Patient reports she is currently in the hospital at [place]: RNCM will attempt follow with patient after discharge from hospital.  Will attempt telephone outreach to patient within 1 week.   Quinn Plowman RN,BSN,CCM [facility] Telephonic  [phone]

## 2018-07-12 ENCOUNTER — Other Ambulatory Visit: Payer: Self-pay | Admitting: Family Medicine

## 2018-07-12 DIAGNOSIS — IMO0002 Reserved for concepts with insufficient information to code with codable children: Secondary | ICD-10-CM

## 2018-07-12 DIAGNOSIS — E1151 Type 2 diabetes mellitus with diabetic peripheral angiopathy without gangrene: Secondary | ICD-10-CM

## 2018-07-12 DIAGNOSIS — E1165 Type 2 diabetes mellitus with hyperglycemia: Principal | ICD-10-CM

## 2018-07-15 ENCOUNTER — Other Ambulatory Visit: Payer: Self-pay

## 2018-07-15 NOTE — Patient Outreach (Signed)
[place] [facility]) Care Management  07/15/2018  ELFREIDA HEGGS Mar 06, 1941 [id number]  TELEPHONE SCREENING Referral date: 07/08/18 Referral source: HTA concierge Referral reason: assistance with stress test copay Insurance: Heath team advantage Attempt #1   Telephone call to patient regarding referral. Unable to reach patient. HIPAA compliant voice message left with call back phone number.   PLAN: RNCM will attempt 2nd telephone call to patient within 4 business days. RNCM will send outreach letter.   Quinn Plowman RN,BSN,CCM [facility] Telephonic  [phone]

## 2018-07-16 DIAGNOSIS — G894 Chronic pain syndrome: Secondary | ICD-10-CM | POA: Diagnosis not present

## 2018-07-16 DIAGNOSIS — E1142 Type 2 diabetes mellitus with diabetic polyneuropathy: Secondary | ICD-10-CM | POA: Diagnosis not present

## 2018-07-16 DIAGNOSIS — K5190 Ulcerative colitis, unspecified, without complications: Secondary | ICD-10-CM | POA: Diagnosis not present

## 2018-07-16 DIAGNOSIS — E1122 Type 2 diabetes mellitus with diabetic chronic kidney disease: Secondary | ICD-10-CM | POA: Diagnosis not present

## 2018-07-16 DIAGNOSIS — E559 Vitamin D deficiency, unspecified: Secondary | ICD-10-CM | POA: Diagnosis not present

## 2018-07-16 DIAGNOSIS — M1990 Unspecified osteoarthritis, unspecified site: Secondary | ICD-10-CM | POA: Diagnosis not present

## 2018-07-16 DIAGNOSIS — J441 Chronic obstructive pulmonary disease with (acute) exacerbation: Secondary | ICD-10-CM | POA: Diagnosis not present

## 2018-07-16 DIAGNOSIS — I2510 Atherosclerotic heart disease of native coronary artery without angina pectoris: Secondary | ICD-10-CM | POA: Diagnosis not present

## 2018-07-16 DIAGNOSIS — E1151 Type 2 diabetes mellitus with diabetic peripheral angiopathy without gangrene: Secondary | ICD-10-CM | POA: Diagnosis not present

## 2018-07-16 DIAGNOSIS — N189 Chronic kidney disease, unspecified: Secondary | ICD-10-CM | POA: Diagnosis not present

## 2018-07-16 DIAGNOSIS — M797 Fibromyalgia: Secondary | ICD-10-CM | POA: Diagnosis not present

## 2018-07-16 DIAGNOSIS — I5042 Chronic combined systolic (congestive) and diastolic (congestive) heart failure: Secondary | ICD-10-CM | POA: Diagnosis not present

## 2018-07-16 DIAGNOSIS — I130 Hypertensive heart and chronic kidney disease with heart failure and stage 1 through stage 4 chronic kidney disease, or unspecified chronic kidney disease: Secondary | ICD-10-CM | POA: Diagnosis not present

## 2018-07-21 ENCOUNTER — Ambulatory Visit: Payer: Self-pay

## 2018-07-21 DIAGNOSIS — R600 Localized edema: Secondary | ICD-10-CM | POA: Diagnosis not present

## 2018-07-23 ENCOUNTER — Other Ambulatory Visit: Payer: Self-pay

## 2018-07-23 NOTE — Patient Outreach (Signed)
[place] [facility]) Care Management  07/23/2018  Stephanie Mckenzie [birth year] [id number]  TELEPHONE SCREENING Referral date:07/08/18 Referral source:HTA concierge Referral reason:assistance with stress test copay Insurance:Heath team advantage Attempt #2   Telephone call to patient regarding referral. Unable to reach patient. Or leave voice message.  Message states mail box is full .   PLAN: RNCM will attempt 3rd  telephone call to patient within 4 business days.    Quinn Plowman RN,BSN,CCM [facility] Telephonic  [phone]

## 2018-07-25 DIAGNOSIS — R600 Localized edema: Secondary | ICD-10-CM | POA: Diagnosis not present

## 2018-07-25 DIAGNOSIS — Z87891 Personal history of nicotine dependence: Secondary | ICD-10-CM | POA: Diagnosis not present

## 2018-07-25 DIAGNOSIS — R0602 Shortness of breath: Secondary | ICD-10-CM | POA: Diagnosis not present

## 2018-07-25 DIAGNOSIS — K120 Recurrent oral aphthae: Secondary | ICD-10-CM | POA: Diagnosis not present

## 2018-07-25 DIAGNOSIS — N189 Chronic kidney disease, unspecified: Secondary | ICD-10-CM | POA: Diagnosis not present

## 2018-07-29 ENCOUNTER — Other Ambulatory Visit: Payer: Self-pay

## 2018-07-29 NOTE — Patient Outreach (Signed)
[place] [facility]) Care Management  07/29/2018  Stephanie Mckenzie November 05, 1941 [id number]    TELEPHONE SCREENING Referral date:07/08/18 Referral source:HTA concierge Referral reason:assistance with stress test copay Insurance:Heath team advantage Attempt #3   Telephone call to patient regarding referral. Unable to reach patient. HIPAA compliant message left with call back phone number.    PLAN: If no return call from patient will proceed with closure    Quinn Plowman RN,BSN,CCM [facility] Telephonic  [phone]

## 2018-07-31 DIAGNOSIS — R600 Localized edema: Secondary | ICD-10-CM | POA: Diagnosis not present

## 2018-07-31 DIAGNOSIS — E7841 Elevated Lipoprotein(a): Secondary | ICD-10-CM | POA: Diagnosis not present

## 2018-07-31 DIAGNOSIS — N183 Chronic kidney disease, stage 3 (moderate): Secondary | ICD-10-CM | POA: Diagnosis not present

## 2018-07-31 DIAGNOSIS — E538 Deficiency of other specified B group vitamins: Secondary | ICD-10-CM | POA: Diagnosis not present

## 2018-07-31 DIAGNOSIS — E1165 Type 2 diabetes mellitus with hyperglycemia: Secondary | ICD-10-CM | POA: Diagnosis not present

## 2018-07-31 DIAGNOSIS — E559 Vitamin D deficiency, unspecified: Secondary | ICD-10-CM | POA: Diagnosis not present

## 2018-07-31 DIAGNOSIS — I150 Renovascular hypertension: Secondary | ICD-10-CM | POA: Diagnosis not present

## 2018-08-04 DIAGNOSIS — E1142 Type 2 diabetes mellitus with diabetic polyneuropathy: Secondary | ICD-10-CM | POA: Diagnosis not present

## 2018-08-04 DIAGNOSIS — M797 Fibromyalgia: Secondary | ICD-10-CM | POA: Diagnosis not present

## 2018-08-04 DIAGNOSIS — G894 Chronic pain syndrome: Secondary | ICD-10-CM | POA: Diagnosis not present

## 2018-08-04 DIAGNOSIS — I130 Hypertensive heart and chronic kidney disease with heart failure and stage 1 through stage 4 chronic kidney disease, or unspecified chronic kidney disease: Secondary | ICD-10-CM | POA: Diagnosis not present

## 2018-08-04 DIAGNOSIS — N183 Chronic kidney disease, stage 3 (moderate): Secondary | ICD-10-CM | POA: Diagnosis not present

## 2018-08-04 DIAGNOSIS — K589 Irritable bowel syndrome without diarrhea: Secondary | ICD-10-CM | POA: Diagnosis not present

## 2018-08-04 DIAGNOSIS — I2510 Atherosclerotic heart disease of native coronary artery without angina pectoris: Secondary | ICD-10-CM | POA: Diagnosis not present

## 2018-08-04 DIAGNOSIS — E1151 Type 2 diabetes mellitus with diabetic peripheral angiopathy without gangrene: Secondary | ICD-10-CM | POA: Diagnosis not present

## 2018-08-04 DIAGNOSIS — I5042 Chronic combined systolic (congestive) and diastolic (congestive) heart failure: Secondary | ICD-10-CM | POA: Diagnosis not present

## 2018-08-04 DIAGNOSIS — E1122 Type 2 diabetes mellitus with diabetic chronic kidney disease: Secondary | ICD-10-CM | POA: Diagnosis not present

## 2018-08-04 DIAGNOSIS — K5190 Ulcerative colitis, unspecified, without complications: Secondary | ICD-10-CM | POA: Diagnosis not present

## 2018-08-04 DIAGNOSIS — J441 Chronic obstructive pulmonary disease with (acute) exacerbation: Secondary | ICD-10-CM | POA: Diagnosis not present

## 2018-08-04 DIAGNOSIS — M1990 Unspecified osteoarthritis, unspecified site: Secondary | ICD-10-CM | POA: Diagnosis not present

## 2018-08-05 DIAGNOSIS — N183 Chronic kidney disease, stage 3 (moderate): Secondary | ICD-10-CM | POA: Diagnosis not present

## 2018-08-05 DIAGNOSIS — E7841 Elevated Lipoprotein(a): Secondary | ICD-10-CM | POA: Diagnosis not present

## 2018-08-05 DIAGNOSIS — E559 Vitamin D deficiency, unspecified: Secondary | ICD-10-CM | POA: Diagnosis not present

## 2018-08-05 DIAGNOSIS — R7989 Other specified abnormal findings of blood chemistry: Secondary | ICD-10-CM | POA: Diagnosis not present

## 2018-08-05 DIAGNOSIS — E538 Deficiency of other specified B group vitamins: Secondary | ICD-10-CM | POA: Diagnosis not present

## 2018-08-13 ENCOUNTER — Other Ambulatory Visit: Payer: Self-pay

## 2018-08-13 NOTE — Patient Outreach (Signed)
[facility]) Care Management  08/13/2018  Stephanie Mckenzie [birth year] [id number]  CASE CLOSURE / TELEPHONE SCREENING Referral date:07/08/18 Referral source:HTA concierge Referral reason:assistance with stress test copay Insurance:Heath team advantage  No response after 3 telephone calls and outreach letter attempt.  PLAN: RNCM will close patient due to being unable to reach.  RNCM will send closure notification to patient's primary MD   Quinn Plowman RN,BSN,CCM [facility] Telephonic  [phone]

## 2018-08-19 DIAGNOSIS — F419 Anxiety disorder, unspecified: Secondary | ICD-10-CM | POA: Diagnosis not present

## 2018-08-19 DIAGNOSIS — E1122 Type 2 diabetes mellitus with diabetic chronic kidney disease: Secondary | ICD-10-CM | POA: Diagnosis not present

## 2018-08-19 DIAGNOSIS — I517 Cardiomegaly: Secondary | ICD-10-CM | POA: Diagnosis not present

## 2018-08-19 DIAGNOSIS — R9431 Abnormal electrocardiogram [ECG] [EKG]: Secondary | ICD-10-CM | POA: Diagnosis not present

## 2018-08-19 DIAGNOSIS — Z833 Family history of diabetes mellitus: Secondary | ICD-10-CM | POA: Diagnosis not present

## 2018-08-19 DIAGNOSIS — R197 Diarrhea, unspecified: Secondary | ICD-10-CM | POA: Diagnosis not present

## 2018-08-19 DIAGNOSIS — I5020 Unspecified systolic (congestive) heart failure: Secondary | ICD-10-CM | POA: Diagnosis not present

## 2018-08-19 DIAGNOSIS — Z7984 Long term (current) use of oral hypoglycemic drugs: Secondary | ICD-10-CM | POA: Diagnosis not present

## 2018-08-19 DIAGNOSIS — I129 Hypertensive chronic kidney disease with stage 1 through stage 4 chronic kidney disease, or unspecified chronic kidney disease: Secondary | ICD-10-CM | POA: Diagnosis not present

## 2018-08-19 DIAGNOSIS — I5033 Acute on chronic diastolic (congestive) heart failure: Secondary | ICD-10-CM | POA: Diagnosis not present

## 2018-08-19 DIAGNOSIS — N179 Acute kidney failure, unspecified: Secondary | ICD-10-CM | POA: Diagnosis not present

## 2018-08-19 DIAGNOSIS — Z79899 Other long term (current) drug therapy: Secondary | ICD-10-CM | POA: Diagnosis not present

## 2018-08-19 DIAGNOSIS — R609 Edema, unspecified: Secondary | ICD-10-CM | POA: Diagnosis not present

## 2018-08-19 DIAGNOSIS — E785 Hyperlipidemia, unspecified: Secondary | ICD-10-CM | POA: Diagnosis not present

## 2018-08-19 DIAGNOSIS — Z8249 Family history of ischemic heart disease and other diseases of the circulatory system: Secondary | ICD-10-CM | POA: Diagnosis not present

## 2018-08-19 DIAGNOSIS — N183 Chronic kidney disease, stage 3 (moderate): Secondary | ICD-10-CM | POA: Diagnosis not present

## 2018-08-19 DIAGNOSIS — Z9111 Patient's noncompliance with dietary regimen: Secondary | ICD-10-CM | POA: Diagnosis not present

## 2018-08-19 DIAGNOSIS — I2510 Atherosclerotic heart disease of native coronary artery without angina pectoris: Secondary | ICD-10-CM | POA: Diagnosis not present

## 2018-08-19 DIAGNOSIS — M7989 Other specified soft tissue disorders: Secondary | ICD-10-CM | POA: Diagnosis not present

## 2018-08-19 DIAGNOSIS — K589 Irritable bowel syndrome without diarrhea: Secondary | ICD-10-CM | POA: Diagnosis not present

## 2018-08-19 DIAGNOSIS — M79605 Pain in left leg: Secondary | ICD-10-CM | POA: Diagnosis not present

## 2018-08-19 DIAGNOSIS — Z6841 Body Mass Index (BMI) 40.0 and over, adult: Secondary | ICD-10-CM | POA: Diagnosis not present

## 2018-08-19 DIAGNOSIS — G4733 Obstructive sleep apnea (adult) (pediatric): Secondary | ICD-10-CM | POA: Diagnosis not present

## 2018-08-19 DIAGNOSIS — R600 Localized edema: Secondary | ICD-10-CM | POA: Diagnosis not present

## 2018-08-19 DIAGNOSIS — E1142 Type 2 diabetes mellitus with diabetic polyneuropathy: Secondary | ICD-10-CM | POA: Diagnosis not present

## 2018-08-19 DIAGNOSIS — R0602 Shortness of breath: Secondary | ICD-10-CM | POA: Diagnosis not present

## 2018-08-19 DIAGNOSIS — E1165 Type 2 diabetes mellitus with hyperglycemia: Secondary | ICD-10-CM | POA: Diagnosis not present

## 2018-08-19 DIAGNOSIS — Z7982 Long term (current) use of aspirin: Secondary | ICD-10-CM | POA: Diagnosis not present

## 2018-08-19 DIAGNOSIS — K219 Gastro-esophageal reflux disease without esophagitis: Secondary | ICD-10-CM | POA: Diagnosis not present

## 2018-08-19 DIAGNOSIS — M79604 Pain in right leg: Secondary | ICD-10-CM | POA: Diagnosis not present

## 2018-08-19 DIAGNOSIS — F329 Major depressive disorder, single episode, unspecified: Secondary | ICD-10-CM | POA: Diagnosis not present

## 2018-08-19 DIAGNOSIS — Z811 Family history of alcohol abuse and dependence: Secondary | ICD-10-CM | POA: Diagnosis not present

## 2018-08-19 DIAGNOSIS — K580 Irritable bowel syndrome with diarrhea: Secondary | ICD-10-CM | POA: Diagnosis not present

## 2018-08-19 DIAGNOSIS — E669 Obesity, unspecified: Secondary | ICD-10-CM | POA: Diagnosis not present

## 2018-08-19 DIAGNOSIS — N281 Cyst of kidney, acquired: Secondary | ICD-10-CM | POA: Diagnosis not present

## 2018-08-19 DIAGNOSIS — I110 Hypertensive heart disease with heart failure: Secondary | ICD-10-CM | POA: Diagnosis not present

## 2018-08-19 DIAGNOSIS — I509 Heart failure, unspecified: Secondary | ICD-10-CM | POA: Diagnosis not present

## 2018-08-19 DIAGNOSIS — Z87891 Personal history of nicotine dependence: Secondary | ICD-10-CM | POA: Diagnosis not present

## 2018-08-19 DIAGNOSIS — E119 Type 2 diabetes mellitus without complications: Secondary | ICD-10-CM | POA: Diagnosis not present

## 2018-08-19 DIAGNOSIS — I272 Pulmonary hypertension, unspecified: Secondary | ICD-10-CM | POA: Diagnosis not present

## 2018-08-19 DIAGNOSIS — H9193 Unspecified hearing loss, bilateral: Secondary | ICD-10-CM | POA: Diagnosis not present

## 2018-08-19 DIAGNOSIS — Z9981 Dependence on supplemental oxygen: Secondary | ICD-10-CM | POA: Diagnosis not present

## 2018-08-19 DIAGNOSIS — R52 Pain, unspecified: Secondary | ICD-10-CM | POA: Diagnosis not present

## 2018-08-19 DIAGNOSIS — Z90710 Acquired absence of both cervix and uterus: Secondary | ICD-10-CM | POA: Diagnosis not present

## 2018-08-19 DIAGNOSIS — I130 Hypertensive heart and chronic kidney disease with heart failure and stage 1 through stage 4 chronic kidney disease, or unspecified chronic kidney disease: Secondary | ICD-10-CM | POA: Diagnosis not present

## 2018-08-19 DIAGNOSIS — Z794 Long term (current) use of insulin: Secondary | ICD-10-CM | POA: Diagnosis not present

## 2018-08-29 ENCOUNTER — Other Ambulatory Visit: Payer: Self-pay

## 2018-08-29 NOTE — Patient Outreach (Signed)
[place] [facility]) Care Management  08/29/2018  TERRIANNE CAVNESS 04-06-1941 [id number]   Referral received. No outreach warranted at this time. Transition of Care  will be completed by primary care provider office who will refer to [facility] care management if needed.  Plan: RN CM will close case.  Jone Baseman, RN, MSN [place] Management Care Management Coordinator Direct Line [phone] Cell [phone] Toll Free: [phone]  Fax: [phone]

## 2018-09-04 ENCOUNTER — Telehealth: Payer: Self-pay

## 2018-09-04 DIAGNOSIS — N183 Chronic kidney disease, stage 3 (moderate): Secondary | ICD-10-CM | POA: Diagnosis not present

## 2018-09-04 DIAGNOSIS — I5032 Chronic diastolic (congestive) heart failure: Secondary | ICD-10-CM | POA: Diagnosis not present

## 2018-09-04 NOTE — Telephone Encounter (Signed)
Called patient left message for return call.

## 2018-09-08 DIAGNOSIS — E782 Mixed hyperlipidemia: Secondary | ICD-10-CM | POA: Diagnosis not present

## 2018-09-08 DIAGNOSIS — G4733 Obstructive sleep apnea (adult) (pediatric): Secondary | ICD-10-CM | POA: Diagnosis not present

## 2018-09-08 DIAGNOSIS — Z87891 Personal history of nicotine dependence: Secondary | ICD-10-CM | POA: Diagnosis not present

## 2018-09-08 DIAGNOSIS — I2510 Atherosclerotic heart disease of native coronary artery without angina pectoris: Secondary | ICD-10-CM | POA: Diagnosis not present

## 2018-09-08 DIAGNOSIS — N183 Chronic kidney disease, stage 3 (moderate): Secondary | ICD-10-CM | POA: Diagnosis not present

## 2018-09-08 DIAGNOSIS — I129 Hypertensive chronic kidney disease with stage 1 through stage 4 chronic kidney disease, or unspecified chronic kidney disease: Secondary | ICD-10-CM | POA: Diagnosis not present

## 2018-09-08 DIAGNOSIS — R0602 Shortness of breath: Secondary | ICD-10-CM | POA: Diagnosis not present

## 2018-09-08 DIAGNOSIS — E1165 Type 2 diabetes mellitus with hyperglycemia: Secondary | ICD-10-CM | POA: Diagnosis not present

## 2018-09-08 DIAGNOSIS — Z7982 Long term (current) use of aspirin: Secondary | ICD-10-CM | POA: Diagnosis not present

## 2018-09-10 DIAGNOSIS — N179 Acute kidney failure, unspecified: Secondary | ICD-10-CM | POA: Diagnosis not present

## 2018-09-10 DIAGNOSIS — Z6841 Body Mass Index (BMI) 40.0 and over, adult: Secondary | ICD-10-CM | POA: Diagnosis not present

## 2018-09-10 DIAGNOSIS — I5033 Acute on chronic diastolic (congestive) heart failure: Secondary | ICD-10-CM | POA: Diagnosis not present

## 2018-09-10 DIAGNOSIS — N049 Nephrotic syndrome with unspecified morphologic changes: Secondary | ICD-10-CM | POA: Diagnosis not present

## 2018-09-10 DIAGNOSIS — I129 Hypertensive chronic kidney disease with stage 1 through stage 4 chronic kidney disease, or unspecified chronic kidney disease: Secondary | ICD-10-CM | POA: Diagnosis not present

## 2018-09-10 DIAGNOSIS — N183 Chronic kidney disease, stage 3 (moderate): Secondary | ICD-10-CM | POA: Diagnosis not present

## 2018-09-16 ENCOUNTER — Other Ambulatory Visit: Payer: Self-pay | Admitting: Pharmacist

## 2018-09-16 DIAGNOSIS — Z7982 Long term (current) use of aspirin: Secondary | ICD-10-CM | POA: Diagnosis not present

## 2018-09-16 DIAGNOSIS — M797 Fibromyalgia: Secondary | ICD-10-CM | POA: Diagnosis not present

## 2018-09-16 DIAGNOSIS — F419 Anxiety disorder, unspecified: Secondary | ICD-10-CM | POA: Diagnosis not present

## 2018-09-16 DIAGNOSIS — E559 Vitamin D deficiency, unspecified: Secondary | ICD-10-CM | POA: Diagnosis not present

## 2018-09-16 DIAGNOSIS — I130 Hypertensive heart and chronic kidney disease with heart failure and stage 1 through stage 4 chronic kidney disease, or unspecified chronic kidney disease: Secondary | ICD-10-CM | POA: Diagnosis not present

## 2018-09-16 DIAGNOSIS — Z9181 History of falling: Secondary | ICD-10-CM | POA: Diagnosis not present

## 2018-09-16 DIAGNOSIS — E1151 Type 2 diabetes mellitus with diabetic peripheral angiopathy without gangrene: Secondary | ICD-10-CM | POA: Diagnosis not present

## 2018-09-16 DIAGNOSIS — Z7952 Long term (current) use of systemic steroids: Secondary | ICD-10-CM | POA: Diagnosis not present

## 2018-09-16 DIAGNOSIS — K5190 Ulcerative colitis, unspecified, without complications: Secondary | ICD-10-CM | POA: Diagnosis not present

## 2018-09-16 DIAGNOSIS — M1990 Unspecified osteoarthritis, unspecified site: Secondary | ICD-10-CM | POA: Diagnosis not present

## 2018-09-16 DIAGNOSIS — E1142 Type 2 diabetes mellitus with diabetic polyneuropathy: Secondary | ICD-10-CM | POA: Diagnosis not present

## 2018-09-16 DIAGNOSIS — G894 Chronic pain syndrome: Secondary | ICD-10-CM | POA: Diagnosis not present

## 2018-09-16 DIAGNOSIS — I5042 Chronic combined systolic (congestive) and diastolic (congestive) heart failure: Secondary | ICD-10-CM | POA: Diagnosis not present

## 2018-09-16 DIAGNOSIS — J441 Chronic obstructive pulmonary disease with (acute) exacerbation: Secondary | ICD-10-CM | POA: Diagnosis not present

## 2018-09-16 DIAGNOSIS — F4321 Adjustment disorder with depressed mood: Secondary | ICD-10-CM | POA: Diagnosis not present

## 2018-09-16 DIAGNOSIS — Z8701 Personal history of pneumonia (recurrent): Secondary | ICD-10-CM | POA: Diagnosis not present

## 2018-09-16 DIAGNOSIS — K589 Irritable bowel syndrome without diarrhea: Secondary | ICD-10-CM | POA: Diagnosis not present

## 2018-09-16 DIAGNOSIS — Z7951 Long term (current) use of inhaled steroids: Secondary | ICD-10-CM | POA: Diagnosis not present

## 2018-09-16 DIAGNOSIS — K219 Gastro-esophageal reflux disease without esophagitis: Secondary | ICD-10-CM | POA: Diagnosis not present

## 2018-09-16 DIAGNOSIS — G4733 Obstructive sleep apnea (adult) (pediatric): Secondary | ICD-10-CM | POA: Diagnosis not present

## 2018-09-16 DIAGNOSIS — N183 Chronic kidney disease, stage 3 (moderate): Secondary | ICD-10-CM | POA: Diagnosis not present

## 2018-09-16 DIAGNOSIS — E1122 Type 2 diabetes mellitus with diabetic chronic kidney disease: Secondary | ICD-10-CM | POA: Diagnosis not present

## 2018-09-16 DIAGNOSIS — Z79891 Long term (current) use of opiate analgesic: Secondary | ICD-10-CM | POA: Diagnosis not present

## 2018-09-16 DIAGNOSIS — I2510 Atherosclerotic heart disease of native coronary artery without angina pectoris: Secondary | ICD-10-CM | POA: Diagnosis not present

## 2018-09-16 DIAGNOSIS — Z87891 Personal history of nicotine dependence: Secondary | ICD-10-CM | POA: Diagnosis not present

## 2018-09-16 NOTE — Patient Outreach (Signed)
[place] [facility]) Care Management  [place]   09/16/2018  HAYLI MILLIGAN 09/07/1941 [id number]   Reason for referral: medication reconciliation post discharge  Referral source: HTA  Current insurance:HTA   PMHx: HTN, HLD, hyperparathyroidism, depression/anxiety, ulcerative colitis, GERD, insomnia, CAD, fibromyalgia, type 2 diabetes mellitus, CKD-III, and OSA.  Noted recent hospitalization 08/19/18 - 08/27/2018 for A/C CHF.   Date Discharged from Hospital: 08/27/2018 Date Medication Reconciliation Performed: 09/16/2018  Medications Discontinued at Discharge:   Dicyclomine  Furosemide  New Medications at Discharge:   Torsemide  Telephonic outreach: Unsuccessful telephone call attempt #1 to patient.   Unable to leave message  Plan:  I will make another outreach attempt to patient within 3-4 business days  Ralene Bathe, PharmD, [place] [phone]

## 2018-09-19 ENCOUNTER — Other Ambulatory Visit: Payer: Self-pay | Admitting: Pharmacist

## 2018-09-19 NOTE — Patient Outreach (Signed)
[place] [facility]) Care Management  [place]  09/19/2018  DORIAN RENFRO Aug 30, 1941 [id number]   Reason for referral: medication reconciliation post discharge  Unsuccessful telephone call attempt #2 to patient.   Patient states she is not feeling well today and prefers that I call her next week.  She reports that she has been experiencing dizziness and her provider recommended that she reduce her metoprolol dose in half which she will start tomorrow.    Plan:  I will follow-up with Ms. Rosana Hoes on Tuesday, October 29th in the afternoon.   Ralene Bathe, PharmD, [place] [phone]

## 2018-09-23 ENCOUNTER — Ambulatory Visit: Payer: Self-pay | Admitting: Pharmacist

## 2018-09-23 ENCOUNTER — Other Ambulatory Visit: Payer: Self-pay | Admitting: Pharmacist

## 2018-09-23 NOTE — Patient Outreach (Signed)
[place] [facility]) Care Management  [place]  09/23/2018  Stephanie Mckenzie Dec 30, 1940 [id number]   Reason for referral: medication reconciliation post discharge  Unsuccessful telephone call attempt #3 to patient.   HIPAA compliant voicemail left requesting a return call  Plan:  I will follow-up on 10th business day from opening case.  If no response from patient at this time, I will close [facility] case  Ralene Bathe, PharmD, [place] [phone]

## 2018-09-29 ENCOUNTER — Other Ambulatory Visit: Payer: Self-pay | Admitting: Pharmacist

## 2018-09-29 ENCOUNTER — Ambulatory Visit: Payer: Self-pay | Admitting: Pharmacist

## 2018-09-29 DIAGNOSIS — N049 Nephrotic syndrome with unspecified morphologic changes: Secondary | ICD-10-CM | POA: Diagnosis not present

## 2018-09-29 DIAGNOSIS — N183 Chronic kidney disease, stage 3 (moderate): Secondary | ICD-10-CM | POA: Diagnosis not present

## 2018-09-29 DIAGNOSIS — Z6841 Body Mass Index (BMI) 40.0 and over, adult: Secondary | ICD-10-CM | POA: Diagnosis not present

## 2018-09-29 DIAGNOSIS — N179 Acute kidney failure, unspecified: Secondary | ICD-10-CM | POA: Diagnosis not present

## 2018-09-29 DIAGNOSIS — I129 Hypertensive chronic kidney disease with stage 1 through stage 4 chronic kidney disease, or unspecified chronic kidney disease: Secondary | ICD-10-CM | POA: Diagnosis not present

## 2018-09-29 NOTE — Patient Outreach (Signed)
[facility]) Care Management [place]  09/29/2018  Stephanie Mckenzie 13-Jul-1941 [id number]  Reason for referral: medication reconciliation post discharge  [facility] pharmacy case is being closed due to the following reasons:  We have been unable to establish and/or maintain contact with the patient.  Ralene Bathe, PharmD, [place] [phone]

## 2018-09-30 DIAGNOSIS — F419 Anxiety disorder, unspecified: Secondary | ICD-10-CM | POA: Diagnosis not present

## 2018-09-30 DIAGNOSIS — E559 Vitamin D deficiency, unspecified: Secondary | ICD-10-CM | POA: Diagnosis not present

## 2018-09-30 DIAGNOSIS — Z7952 Long term (current) use of systemic steroids: Secondary | ICD-10-CM | POA: Diagnosis not present

## 2018-09-30 DIAGNOSIS — Z7982 Long term (current) use of aspirin: Secondary | ICD-10-CM | POA: Diagnosis not present

## 2018-09-30 DIAGNOSIS — G4733 Obstructive sleep apnea (adult) (pediatric): Secondary | ICD-10-CM | POA: Diagnosis not present

## 2018-09-30 DIAGNOSIS — I130 Hypertensive heart and chronic kidney disease with heart failure and stage 1 through stage 4 chronic kidney disease, or unspecified chronic kidney disease: Secondary | ICD-10-CM | POA: Diagnosis not present

## 2018-09-30 DIAGNOSIS — E1151 Type 2 diabetes mellitus with diabetic peripheral angiopathy without gangrene: Secondary | ICD-10-CM | POA: Diagnosis not present

## 2018-09-30 DIAGNOSIS — F4321 Adjustment disorder with depressed mood: Secondary | ICD-10-CM | POA: Diagnosis not present

## 2018-09-30 DIAGNOSIS — G894 Chronic pain syndrome: Secondary | ICD-10-CM | POA: Diagnosis not present

## 2018-09-30 DIAGNOSIS — J441 Chronic obstructive pulmonary disease with (acute) exacerbation: Secondary | ICD-10-CM | POA: Diagnosis not present

## 2018-09-30 DIAGNOSIS — K5190 Ulcerative colitis, unspecified, without complications: Secondary | ICD-10-CM | POA: Diagnosis not present

## 2018-09-30 DIAGNOSIS — E1142 Type 2 diabetes mellitus with diabetic polyneuropathy: Secondary | ICD-10-CM | POA: Diagnosis not present

## 2018-09-30 DIAGNOSIS — Z79891 Long term (current) use of opiate analgesic: Secondary | ICD-10-CM | POA: Diagnosis not present

## 2018-09-30 DIAGNOSIS — I2510 Atherosclerotic heart disease of native coronary artery without angina pectoris: Secondary | ICD-10-CM | POA: Diagnosis not present

## 2018-09-30 DIAGNOSIS — Z7951 Long term (current) use of inhaled steroids: Secondary | ICD-10-CM | POA: Diagnosis not present

## 2018-09-30 DIAGNOSIS — I5042 Chronic combined systolic (congestive) and diastolic (congestive) heart failure: Secondary | ICD-10-CM | POA: Diagnosis not present

## 2018-09-30 DIAGNOSIS — M797 Fibromyalgia: Secondary | ICD-10-CM | POA: Diagnosis not present

## 2018-09-30 DIAGNOSIS — K589 Irritable bowel syndrome without diarrhea: Secondary | ICD-10-CM | POA: Diagnosis not present

## 2018-09-30 DIAGNOSIS — Z8701 Personal history of pneumonia (recurrent): Secondary | ICD-10-CM | POA: Diagnosis not present

## 2018-09-30 DIAGNOSIS — E1122 Type 2 diabetes mellitus with diabetic chronic kidney disease: Secondary | ICD-10-CM | POA: Diagnosis not present

## 2018-09-30 DIAGNOSIS — Z9181 History of falling: Secondary | ICD-10-CM | POA: Diagnosis not present

## 2018-09-30 DIAGNOSIS — M1990 Unspecified osteoarthritis, unspecified site: Secondary | ICD-10-CM | POA: Diagnosis not present

## 2018-09-30 DIAGNOSIS — Z87891 Personal history of nicotine dependence: Secondary | ICD-10-CM | POA: Diagnosis not present

## 2018-09-30 DIAGNOSIS — K219 Gastro-esophageal reflux disease without esophagitis: Secondary | ICD-10-CM | POA: Diagnosis not present

## 2018-09-30 DIAGNOSIS — N183 Chronic kidney disease, stage 3 (moderate): Secondary | ICD-10-CM | POA: Diagnosis not present

## 2018-10-02 DIAGNOSIS — M810 Age-related osteoporosis without current pathological fracture: Secondary | ICD-10-CM | POA: Diagnosis not present

## 2018-10-02 DIAGNOSIS — L989 Disorder of the skin and subcutaneous tissue, unspecified: Secondary | ICD-10-CM | POA: Diagnosis not present

## 2018-10-02 DIAGNOSIS — I10 Essential (primary) hypertension: Secondary | ICD-10-CM | POA: Diagnosis not present

## 2018-10-02 DIAGNOSIS — E1165 Type 2 diabetes mellitus with hyperglycemia: Secondary | ICD-10-CM | POA: Diagnosis not present

## 2018-10-02 DIAGNOSIS — R600 Localized edema: Secondary | ICD-10-CM | POA: Diagnosis not present

## 2018-10-13 DIAGNOSIS — F331 Major depressive disorder, recurrent, moderate: Secondary | ICD-10-CM | POA: Diagnosis not present

## 2018-10-13 DIAGNOSIS — F4323 Adjustment disorder with mixed anxiety and depressed mood: Secondary | ICD-10-CM | POA: Diagnosis not present

## 2018-10-27 DIAGNOSIS — F419 Anxiety disorder, unspecified: Secondary | ICD-10-CM | POA: Diagnosis not present

## 2018-10-27 DIAGNOSIS — I110 Hypertensive heart disease with heart failure: Secondary | ICD-10-CM | POA: Diagnosis not present

## 2018-10-27 DIAGNOSIS — M797 Fibromyalgia: Secondary | ICD-10-CM | POA: Diagnosis not present

## 2018-10-27 DIAGNOSIS — I6523 Occlusion and stenosis of bilateral carotid arteries: Secondary | ICD-10-CM | POA: Diagnosis not present

## 2018-10-27 DIAGNOSIS — G92 Toxic encephalopathy: Secondary | ICD-10-CM | POA: Diagnosis not present

## 2018-10-27 DIAGNOSIS — R52 Pain, unspecified: Secondary | ICD-10-CM | POA: Diagnosis not present

## 2018-10-27 DIAGNOSIS — M79632 Pain in left forearm: Secondary | ICD-10-CM | POA: Diagnosis not present

## 2018-10-27 DIAGNOSIS — R4182 Altered mental status, unspecified: Secondary | ICD-10-CM | POA: Diagnosis not present

## 2018-10-27 DIAGNOSIS — R9431 Abnormal electrocardiogram [ECG] [EKG]: Secondary | ICD-10-CM | POA: Diagnosis not present

## 2018-10-27 DIAGNOSIS — G4700 Insomnia, unspecified: Secondary | ICD-10-CM | POA: Diagnosis not present

## 2018-10-27 DIAGNOSIS — I517 Cardiomegaly: Secondary | ICD-10-CM | POA: Diagnosis not present

## 2018-10-27 DIAGNOSIS — I5033 Acute on chronic diastolic (congestive) heart failure: Secondary | ICD-10-CM | POA: Diagnosis not present

## 2018-10-27 DIAGNOSIS — Z6841 Body Mass Index (BMI) 40.0 and over, adult: Secondary | ICD-10-CM | POA: Diagnosis not present

## 2018-10-27 DIAGNOSIS — I959 Hypotension, unspecified: Secondary | ICD-10-CM | POA: Diagnosis not present

## 2018-10-27 DIAGNOSIS — N183 Chronic kidney disease, stage 3 (moderate): Secondary | ICD-10-CM | POA: Diagnosis not present

## 2018-10-27 DIAGNOSIS — R42 Dizziness and giddiness: Secondary | ICD-10-CM | POA: Diagnosis not present

## 2018-10-27 DIAGNOSIS — Z794 Long term (current) use of insulin: Secondary | ICD-10-CM | POA: Diagnosis not present

## 2018-10-27 DIAGNOSIS — M810 Age-related osteoporosis without current pathological fracture: Secondary | ICD-10-CM | POA: Diagnosis not present

## 2018-10-27 DIAGNOSIS — G9341 Metabolic encephalopathy: Secondary | ICD-10-CM | POA: Diagnosis not present

## 2018-10-27 DIAGNOSIS — E1122 Type 2 diabetes mellitus with diabetic chronic kidney disease: Secondary | ICD-10-CM | POA: Diagnosis not present

## 2018-10-27 DIAGNOSIS — G4730 Sleep apnea, unspecified: Secondary | ICD-10-CM | POA: Diagnosis not present

## 2018-10-27 DIAGNOSIS — Z7409 Other reduced mobility: Secondary | ICD-10-CM | POA: Diagnosis not present

## 2018-10-27 DIAGNOSIS — I130 Hypertensive heart and chronic kidney disease with heart failure and stage 1 through stage 4 chronic kidney disease, or unspecified chronic kidney disease: Secondary | ICD-10-CM | POA: Diagnosis not present

## 2018-10-27 DIAGNOSIS — K219 Gastro-esophageal reflux disease without esophagitis: Secondary | ICD-10-CM | POA: Diagnosis not present

## 2018-10-27 DIAGNOSIS — R110 Nausea: Secondary | ICD-10-CM | POA: Diagnosis not present

## 2018-10-27 DIAGNOSIS — F329 Major depressive disorder, single episode, unspecified: Secondary | ICD-10-CM | POA: Diagnosis not present

## 2018-10-27 DIAGNOSIS — I5020 Unspecified systolic (congestive) heart failure: Secondary | ICD-10-CM | POA: Diagnosis not present

## 2018-10-27 DIAGNOSIS — I499 Cardiac arrhythmia, unspecified: Secondary | ICD-10-CM | POA: Diagnosis not present

## 2018-10-27 DIAGNOSIS — G894 Chronic pain syndrome: Secondary | ICD-10-CM | POA: Diagnosis not present

## 2018-10-27 DIAGNOSIS — E119 Type 2 diabetes mellitus without complications: Secondary | ICD-10-CM | POA: Diagnosis not present

## 2018-10-27 DIAGNOSIS — I2510 Atherosclerotic heart disease of native coronary artery without angina pectoris: Secondary | ICD-10-CM | POA: Diagnosis not present

## 2018-10-27 DIAGNOSIS — I129 Hypertensive chronic kidney disease with stage 1 through stage 4 chronic kidney disease, or unspecified chronic kidney disease: Secondary | ICD-10-CM | POA: Diagnosis not present

## 2018-10-27 DIAGNOSIS — I6350 Cerebral infarction due to unspecified occlusion or stenosis of unspecified cerebral artery: Secondary | ICD-10-CM | POA: Diagnosis not present

## 2018-10-27 DIAGNOSIS — E873 Alkalosis: Secondary | ICD-10-CM | POA: Diagnosis not present

## 2018-10-27 DIAGNOSIS — N189 Chronic kidney disease, unspecified: Secondary | ICD-10-CM | POA: Diagnosis not present

## 2018-10-27 DIAGNOSIS — G8929 Other chronic pain: Secondary | ICD-10-CM | POA: Diagnosis not present

## 2018-10-27 DIAGNOSIS — I639 Cerebral infarction, unspecified: Secondary | ICD-10-CM | POA: Diagnosis not present

## 2018-10-27 DIAGNOSIS — E1159 Type 2 diabetes mellitus with other circulatory complications: Secondary | ICD-10-CM | POA: Diagnosis not present

## 2018-10-27 DIAGNOSIS — G9340 Encephalopathy, unspecified: Secondary | ICD-10-CM | POA: Diagnosis not present

## 2018-10-27 DIAGNOSIS — I272 Pulmonary hypertension, unspecified: Secondary | ICD-10-CM | POA: Diagnosis not present

## 2018-10-27 DIAGNOSIS — G4733 Obstructive sleep apnea (adult) (pediatric): Secondary | ICD-10-CM | POA: Diagnosis not present

## 2018-10-27 DIAGNOSIS — R531 Weakness: Secondary | ICD-10-CM | POA: Diagnosis not present

## 2018-10-27 DIAGNOSIS — E876 Hypokalemia: Secondary | ICD-10-CM | POA: Diagnosis not present

## 2018-10-27 DIAGNOSIS — R410 Disorientation, unspecified: Secondary | ICD-10-CM | POA: Diagnosis not present

## 2018-10-27 DIAGNOSIS — I69993 Ataxia following unspecified cerebrovascular disease: Secondary | ICD-10-CM | POA: Diagnosis not present

## 2018-10-27 DIAGNOSIS — N179 Acute kidney failure, unspecified: Secondary | ICD-10-CM | POA: Diagnosis not present

## 2018-10-27 DIAGNOSIS — I951 Orthostatic hypotension: Secondary | ICD-10-CM | POA: Diagnosis not present

## 2018-10-27 DIAGNOSIS — H9193 Unspecified hearing loss, bilateral: Secondary | ICD-10-CM | POA: Diagnosis not present

## 2018-10-27 DIAGNOSIS — T502X5A Adverse effect of carbonic-anhydrase inhibitors, benzothiadiazides and other diuretics, initial encounter: Secondary | ICD-10-CM | POA: Diagnosis not present

## 2018-10-27 DIAGNOSIS — Z9989 Dependence on other enabling machines and devices: Secondary | ICD-10-CM | POA: Diagnosis not present

## 2018-10-27 DIAGNOSIS — R7989 Other specified abnormal findings of blood chemistry: Secondary | ICD-10-CM | POA: Diagnosis not present

## 2018-10-27 DIAGNOSIS — Z90710 Acquired absence of both cervix and uterus: Secondary | ICD-10-CM | POA: Diagnosis not present

## 2018-10-27 DIAGNOSIS — D649 Anemia, unspecified: Secondary | ICD-10-CM | POA: Diagnosis not present

## 2018-10-27 DIAGNOSIS — R296 Repeated falls: Secondary | ICD-10-CM | POA: Diagnosis not present

## 2018-10-27 DIAGNOSIS — E1151 Type 2 diabetes mellitus with diabetic peripheral angiopathy without gangrene: Secondary | ICD-10-CM | POA: Diagnosis not present

## 2018-10-31 DIAGNOSIS — I129 Hypertensive chronic kidney disease with stage 1 through stage 4 chronic kidney disease, or unspecified chronic kidney disease: Secondary | ICD-10-CM | POA: Diagnosis not present

## 2018-10-31 DIAGNOSIS — I2510 Atherosclerotic heart disease of native coronary artery without angina pectoris: Secondary | ICD-10-CM | POA: Diagnosis not present

## 2018-10-31 DIAGNOSIS — G9341 Metabolic encephalopathy: Secondary | ICD-10-CM | POA: Diagnosis not present

## 2018-10-31 DIAGNOSIS — I5189 Other ill-defined heart diseases: Secondary | ICD-10-CM | POA: Diagnosis not present

## 2018-10-31 DIAGNOSIS — E1159 Type 2 diabetes mellitus with other circulatory complications: Secondary | ICD-10-CM | POA: Diagnosis not present

## 2018-10-31 DIAGNOSIS — I110 Hypertensive heart disease with heart failure: Secondary | ICD-10-CM | POA: Diagnosis not present

## 2018-10-31 DIAGNOSIS — I272 Pulmonary hypertension, unspecified: Secondary | ICD-10-CM | POA: Diagnosis not present

## 2018-10-31 DIAGNOSIS — K219 Gastro-esophageal reflux disease without esophagitis: Secondary | ICD-10-CM | POA: Diagnosis not present

## 2018-10-31 DIAGNOSIS — I6389 Other cerebral infarction: Secondary | ICD-10-CM | POA: Diagnosis not present

## 2018-10-31 DIAGNOSIS — I639 Cerebral infarction, unspecified: Secondary | ICD-10-CM | POA: Diagnosis not present

## 2018-10-31 DIAGNOSIS — I6350 Cerebral infarction due to unspecified occlusion or stenosis of unspecified cerebral artery: Secondary | ICD-10-CM | POA: Diagnosis not present

## 2018-10-31 DIAGNOSIS — N179 Acute kidney failure, unspecified: Secondary | ICD-10-CM | POA: Diagnosis not present

## 2018-10-31 DIAGNOSIS — E873 Alkalosis: Secondary | ICD-10-CM | POA: Diagnosis not present

## 2018-10-31 DIAGNOSIS — I69993 Ataxia following unspecified cerebrovascular disease: Secondary | ICD-10-CM | POA: Diagnosis not present

## 2018-10-31 DIAGNOSIS — R4182 Altered mental status, unspecified: Secondary | ICD-10-CM | POA: Diagnosis not present

## 2018-10-31 DIAGNOSIS — I6523 Occlusion and stenosis of bilateral carotid arteries: Secondary | ICD-10-CM | POA: Diagnosis not present

## 2018-10-31 DIAGNOSIS — N183 Chronic kidney disease, stage 3 (moderate): Secondary | ICD-10-CM | POA: Diagnosis not present

## 2018-10-31 DIAGNOSIS — I951 Orthostatic hypotension: Secondary | ICD-10-CM | POA: Diagnosis not present

## 2018-10-31 DIAGNOSIS — G92 Toxic encephalopathy: Secondary | ICD-10-CM | POA: Diagnosis not present

## 2018-10-31 DIAGNOSIS — K5190 Ulcerative colitis, unspecified, without complications: Secondary | ICD-10-CM | POA: Diagnosis not present

## 2018-10-31 DIAGNOSIS — G9340 Encephalopathy, unspecified: Secondary | ICD-10-CM | POA: Diagnosis not present

## 2018-10-31 DIAGNOSIS — I10 Essential (primary) hypertension: Secondary | ICD-10-CM | POA: Diagnosis not present

## 2018-10-31 DIAGNOSIS — M797 Fibromyalgia: Secondary | ICD-10-CM | POA: Diagnosis not present

## 2018-10-31 DIAGNOSIS — I5020 Unspecified systolic (congestive) heart failure: Secondary | ICD-10-CM | POA: Diagnosis not present

## 2018-10-31 DIAGNOSIS — J984 Other disorders of lung: Secondary | ICD-10-CM | POA: Diagnosis not present

## 2018-10-31 DIAGNOSIS — M79661 Pain in right lower leg: Secondary | ICD-10-CM | POA: Diagnosis not present

## 2018-10-31 DIAGNOSIS — D649 Anemia, unspecified: Secondary | ICD-10-CM | POA: Diagnosis not present

## 2018-10-31 DIAGNOSIS — G4700 Insomnia, unspecified: Secondary | ICD-10-CM | POA: Diagnosis not present

## 2018-10-31 DIAGNOSIS — G8929 Other chronic pain: Secondary | ICD-10-CM | POA: Diagnosis not present

## 2018-10-31 DIAGNOSIS — G4733 Obstructive sleep apnea (adult) (pediatric): Secondary | ICD-10-CM | POA: Diagnosis not present

## 2018-11-03 DIAGNOSIS — K5190 Ulcerative colitis, unspecified, without complications: Secondary | ICD-10-CM | POA: Diagnosis not present

## 2018-11-03 DIAGNOSIS — J984 Other disorders of lung: Secondary | ICD-10-CM | POA: Diagnosis not present

## 2018-11-03 DIAGNOSIS — I5189 Other ill-defined heart diseases: Secondary | ICD-10-CM | POA: Diagnosis not present

## 2018-11-03 DIAGNOSIS — I6389 Other cerebral infarction: Secondary | ICD-10-CM | POA: Diagnosis not present

## 2018-11-07 DIAGNOSIS — K219 Gastro-esophageal reflux disease without esophagitis: Secondary | ICD-10-CM | POA: Diagnosis not present

## 2018-11-12 DIAGNOSIS — I5189 Other ill-defined heart diseases: Secondary | ICD-10-CM | POA: Diagnosis not present

## 2018-11-12 DIAGNOSIS — I10 Essential (primary) hypertension: Secondary | ICD-10-CM | POA: Diagnosis not present

## 2018-11-17 DIAGNOSIS — I5189 Other ill-defined heart diseases: Secondary | ICD-10-CM | POA: Diagnosis not present

## 2018-11-17 DIAGNOSIS — M79661 Pain in right lower leg: Secondary | ICD-10-CM | POA: Diagnosis not present

## 2018-11-25 ENCOUNTER — Other Ambulatory Visit: Payer: Self-pay

## 2018-11-25 DIAGNOSIS — I509 Heart failure, unspecified: Secondary | ICD-10-CM | POA: Diagnosis not present

## 2018-11-25 DIAGNOSIS — I130 Hypertensive heart and chronic kidney disease with heart failure and stage 1 through stage 4 chronic kidney disease, or unspecified chronic kidney disease: Secondary | ICD-10-CM | POA: Diagnosis not present

## 2018-11-25 DIAGNOSIS — M797 Fibromyalgia: Secondary | ICD-10-CM | POA: Diagnosis not present

## 2018-11-25 DIAGNOSIS — K219 Gastro-esophageal reflux disease without esophagitis: Secondary | ICD-10-CM | POA: Diagnosis not present

## 2018-11-25 DIAGNOSIS — F419 Anxiety disorder, unspecified: Secondary | ICD-10-CM | POA: Diagnosis not present

## 2018-11-25 DIAGNOSIS — E1140 Type 2 diabetes mellitus with diabetic neuropathy, unspecified: Secondary | ICD-10-CM | POA: Diagnosis not present

## 2018-11-25 DIAGNOSIS — E1122 Type 2 diabetes mellitus with diabetic chronic kidney disease: Secondary | ICD-10-CM | POA: Diagnosis not present

## 2018-11-25 DIAGNOSIS — F339 Major depressive disorder, recurrent, unspecified: Secondary | ICD-10-CM | POA: Diagnosis not present

## 2018-11-25 DIAGNOSIS — M1990 Unspecified osteoarthritis, unspecified site: Secondary | ICD-10-CM | POA: Diagnosis not present

## 2018-11-25 DIAGNOSIS — I2510 Atherosclerotic heart disease of native coronary artery without angina pectoris: Secondary | ICD-10-CM | POA: Diagnosis not present

## 2018-11-25 DIAGNOSIS — H905 Unspecified sensorineural hearing loss: Secondary | ICD-10-CM | POA: Diagnosis not present

## 2018-11-25 DIAGNOSIS — G894 Chronic pain syndrome: Secondary | ICD-10-CM | POA: Diagnosis not present

## 2018-11-25 DIAGNOSIS — J449 Chronic obstructive pulmonary disease, unspecified: Secondary | ICD-10-CM | POA: Diagnosis not present

## 2018-11-25 DIAGNOSIS — I272 Pulmonary hypertension, unspecified: Secondary | ICD-10-CM | POA: Diagnosis not present

## 2018-11-25 DIAGNOSIS — G4733 Obstructive sleep apnea (adult) (pediatric): Secondary | ICD-10-CM | POA: Diagnosis not present

## 2018-11-25 DIAGNOSIS — N183 Chronic kidney disease, stage 3 (moderate): Secondary | ICD-10-CM | POA: Diagnosis not present

## 2018-11-25 DIAGNOSIS — I69393 Ataxia following cerebral infarction: Secondary | ICD-10-CM | POA: Diagnosis not present

## 2018-11-25 DIAGNOSIS — M810 Age-related osteoporosis without current pathological fracture: Secondary | ICD-10-CM | POA: Diagnosis not present

## 2018-11-25 NOTE — Patient Outreach (Signed)
[facility]) Care Management  11/25/2018  Stephanie Mckenzie 1941-05-15 [id number]     Transition of Care Referral  Referral Date: 11/25/18 Referral Source: HTA Discharge Report Date of Admission: unknown Diagnosis:unknown Date of Discharge: 11/21/18 [place]: HTA    Outreach attempt # 1 to patient. No answer at present. RN CM left HIPAA compliant voicemail message along with contact info.    Plan: RN CM will make outreach attempt to patient within 3-4 business days. RN CM will send unsuccessful outreach letter to patient.   Enzo [place], RN,BSN,CCM [place] Management Telephonic Care Management Coordinator Direct Phone: [phone] Toll Free: [phone] Fax: [phone]

## 2018-12-01 ENCOUNTER — Other Ambulatory Visit: Payer: Self-pay

## 2018-12-01 NOTE — Patient Outreach (Addendum)
[place] [facility]) Care Management  12/01/2018  Stephanie Mckenzie Jul 30, 1941 [id number]   Transition of Care Referral  Referral Date: 11/25/18 Referral Source: HTA Discharge Report Date of Admission: unknown Diagnosis:unknown Date of Discharge: 11/21/18 [place]: HTA   Outreach attempt #2 to patient. No answer at present.     Plan: RN CM will make outreach attempt to patient within 3-4 business days.  Enzo [place], RN,BSN,CCM [place] Management Telephonic Care Management Coordinator Direct Phone: [phone] Toll Free: [phone] Fax: [phone]

## 2018-12-01 NOTE — Telephone Encounter (Signed)
This encounter was created in error - please disregard.

## 2018-12-02 ENCOUNTER — Other Ambulatory Visit: Payer: Self-pay

## 2018-12-02 NOTE — Patient Outreach (Signed)
[facility]) Care Management  12/02/2018  Stephanie Mckenzie Jan 03, 1941 [id number]   Transition of Care Referral  Referral Date:11/25/18 Referral Source:HTA Discharge Report Date of Admission:unknown Diagnosis:unknown Date of Discharge:11/21/18 [place]   Outreach attempt #3 to patient. No answer at present.    Plan: RN CM will close case if no response from letter mailed to patient.  Enzo [place], RN,BSN,CCM [place] Management Telephonic Care Management Coordinator Direct Phone: [phone] Toll Free: [phone] Fax: [phone]

## 2018-12-08 ENCOUNTER — Other Ambulatory Visit: Payer: Self-pay

## 2018-12-08 NOTE — Patient Outreach (Signed)
[facility]) Care Management  12/08/2018  Stephanie Mckenzie 1941/11/12 [id number]   Transition of Care Referral  Referral Date:11/25/18 Referral Source:HTA Discharge Report Date of Admission:unknown Diagnosis:unknown Date of Discharge:11/21/18 Facility:[facility] Insurance:HTA   Multiple attempts to establish contact with patient without success. No response from letter mailed to patient. Case is being closed at this time.    Plan: RN CM will close case at this time.    Enzo [place], RN,BSN,CCM [place] Management Telephonic Care Management Coordinator Direct Phone: [phone] Toll Free: [phone] Fax: [phone]

## 2019-09-05 ENCOUNTER — Emergency Department (HOSPITAL_COMMUNITY)
Admission: EM | Admit: 2019-09-05 | Discharge: 2019-09-06 | Disposition: A | Discharge status: Home / Self Care | Payer: Medicare Other | Attending: Emergency Medicine | Admitting: Emergency Medicine

## 2019-09-05 ENCOUNTER — Other Ambulatory Visit: Payer: Self-pay

## 2019-09-05 ENCOUNTER — Encounter (HOSPITAL_COMMUNITY): Payer: Self-pay | Admitting: Emergency Medicine

## 2019-09-05 ENCOUNTER — Emergency Department (HOSPITAL_COMMUNITY): Payer: Medicare Other

## 2019-09-05 DIAGNOSIS — R0602 Shortness of breath: Secondary | ICD-10-CM | POA: Insufficient documentation

## 2019-09-05 DIAGNOSIS — R350 Frequency of micturition: Secondary | ICD-10-CM | POA: Insufficient documentation

## 2019-09-05 DIAGNOSIS — N183 Chronic kidney disease, stage 3 unspecified: Secondary | ICD-10-CM | POA: Insufficient documentation

## 2019-09-05 DIAGNOSIS — M79605 Pain in left leg: Secondary | ICD-10-CM | POA: Insufficient documentation

## 2019-09-05 DIAGNOSIS — M79604 Pain in right leg: Secondary | ICD-10-CM | POA: Insufficient documentation

## 2019-09-05 DIAGNOSIS — Z7984 Long term (current) use of oral hypoglycemic drugs: Secondary | ICD-10-CM | POA: Diagnosis not present

## 2019-09-05 DIAGNOSIS — R8290 Unspecified abnormal findings in urine: Secondary | ICD-10-CM | POA: Diagnosis not present

## 2019-09-05 DIAGNOSIS — Z7982 Long term (current) use of aspirin: Secondary | ICD-10-CM | POA: Diagnosis not present

## 2019-09-05 DIAGNOSIS — I2510 Atherosclerotic heart disease of native coronary artery without angina pectoris: Secondary | ICD-10-CM | POA: Insufficient documentation

## 2019-09-05 DIAGNOSIS — Z8543 Personal history of malignant neoplasm of ovary: Secondary | ICD-10-CM | POA: Insufficient documentation

## 2019-09-05 DIAGNOSIS — I5030 Unspecified diastolic (congestive) heart failure: Secondary | ICD-10-CM | POA: Diagnosis not present

## 2019-09-05 DIAGNOSIS — Z79899 Other long term (current) drug therapy: Secondary | ICD-10-CM | POA: Diagnosis not present

## 2019-09-05 DIAGNOSIS — E1122 Type 2 diabetes mellitus with diabetic chronic kidney disease: Secondary | ICD-10-CM | POA: Insufficient documentation

## 2019-09-05 DIAGNOSIS — Z87891 Personal history of nicotine dependence: Secondary | ICD-10-CM | POA: Insufficient documentation

## 2019-09-05 DIAGNOSIS — R600 Localized edema: Secondary | ICD-10-CM

## 2019-09-05 DIAGNOSIS — I130 Hypertensive heart and chronic kidney disease with heart failure and stage 1 through stage 4 chronic kidney disease, or unspecified chronic kidney disease: Secondary | ICD-10-CM | POA: Insufficient documentation

## 2019-09-05 LAB — COMPREHENSIVE METABOLIC PANEL
ALT: 23 U/L (ref 0–44)
AST: 23 U/L (ref 15–41)
Albumin: 3.4 g/dL — ABNORMAL LOW (ref 3.5–5.0)
Alkaline Phosphatase: 96 U/L (ref 38–126)
Anion gap: 15 (ref 5–15)
BUN: 26 mg/dL — ABNORMAL HIGH (ref 8–23)
CO2: 23 mmol/L (ref 22–32)
Calcium: 9.7 mg/dL (ref 8.9–10.3)
Chloride: 102 mmol/L (ref 98–111)
Creatinine, Ser: 1.46 mg/dL — ABNORMAL HIGH (ref 0.44–1.00)
GFR calc Af Amer: 40 mL/min — ABNORMAL LOW (ref >60)
GFR calc non Af Amer: 35 mL/min — ABNORMAL LOW (ref >60)
Glucose, Bld: 168 mg/dL — ABNORMAL HIGH (ref 70–99)
Potassium: 3 mmol/L — ABNORMAL LOW (ref 3.5–5.1)
Sodium: 140 mmol/L (ref 135–145)
Total Bilirubin: 0.4 mg/dL (ref 0.3–1.2)
Total Protein: 6.5 g/dL (ref 6.5–8.1)

## 2019-09-05 LAB — BRAIN NATRIURETIC PEPTIDE: B Natriuretic Peptide: 91 pg/mL (ref 0.0–100.0)

## 2019-09-05 LAB — CBC
HCT: 38.2 % (ref 36.0–46.0)
Hemoglobin: 12 g/dL (ref 12.0–15.0)
MCH: 27.9 pg (ref 26.0–34.0)
MCHC: 31.4 g/dL (ref 30.0–36.0)
MCV: 88.8 fL (ref 80.0–100.0)
Platelets: 252 10*3/uL (ref 150–400)
RBC: 4.3 MIL/uL (ref 3.87–5.11)
RDW: 17 % — ABNORMAL HIGH (ref 11.5–15.5)
WBC: 6.4 10*3/uL (ref 4.0–10.5)
nRBC: 0 % (ref 0.0–0.2)

## 2019-09-05 LAB — TROPONIN I (HIGH SENSITIVITY): Troponin I (High Sensitivity): 16 ng/L (ref <18)

## 2019-09-05 MED ORDER — FUROSEMIDE 10 MG/ML IJ SOLN
40.0000 mg | Freq: Once | INTRAMUSCULAR | Status: AC
  Administered 2019-09-05: 40 mg via INTRAVENOUS
  Filled 2019-09-05: qty 4

## 2019-09-05 MED ORDER — POTASSIUM CHLORIDE CRYS ER 20 MEQ PO TBCR
40.0000 meq | EXTENDED_RELEASE_TABLET | Freq: Once | ORAL | Status: AC
  Administered 2019-09-06: 40 meq via ORAL
  Filled 2019-09-05: qty 2

## 2019-09-05 NOTE — ED Provider Notes (Signed)
Assumed care from prior team at shift change.  See prior notes for full H&P.  Briefly, 78 y.o. F here with worsening LE edema, mild SOB.  Looks well here.  Work-up thus far reassuring with normal trop, BNP, CXR.  Has been given some IV lasix and K+.  Plan:  Delta trop, UA, Dimer, ambulation with pulse ox pending.  If all WNL, can likely discharge.  Results for orders placed or performed during the hospital encounter of 09/05/19  Comprehensive metabolic panel  Result Value Ref Range   Sodium 140 135 - 145 mmol/L   Potassium 3.0 (L) 3.5 - 5.1 mmol/L   Chloride 102 98 - 111 mmol/L   CO2 23 22 - 32 mmol/L   Glucose, Bld 168 (H) 70 - 99 mg/dL   BUN 26 (H) 8 - 23 mg/dL   Creatinine, Ser 1.46 (H) 0.44 - 1.00 mg/dL   Calcium 9.7 8.9 - 10.3 mg/dL   Total Protein 6.5 6.5 - 8.1 g/dL   Albumin 3.4 (L) 3.5 - 5.0 g/dL   AST 23 15 - 41 U/L   ALT 23 0 - 44 U/L   Alkaline Phosphatase 96 38 - 126 U/L   Total Bilirubin 0.4 0.3 - 1.2 mg/dL   GFR calc non Af Amer 35 (L) >60 mL/min   GFR calc Af Amer 40 (L) >60 mL/min   Anion gap 15 5 - 15  Brain natriuretic peptide  Result Value Ref Range   B Natriuretic Peptide 91.0 0.0 - 100.0 pg/mL  CBC  Result Value Ref Range   WBC 6.4 4.0 - 10.5 K/uL   RBC 4.30 3.87 - 5.11 MIL/uL   Hemoglobin 12.0 12.0 - 15.0 g/dL   HCT 38.2 36.0 - 46.0 %   MCV 88.8 80.0 - 100.0 fL   MCH 27.9 26.0 - 34.0 pg   MCHC 31.4 30.0 - 36.0 g/dL   RDW 17.0 (H) 11.5 - 15.5 %   Platelets 252 150 - 400 K/uL   nRBC 0.0 0.0 - 0.2 %  Urinalysis, Routine w reflex microscopic  Result Value Ref Range   Color, Urine STRAW (A) YELLOW   APPearance CLEAR CLEAR   Specific Gravity, Urine 1.010 1.005 - 1.030   pH 7.0 5.0 - 8.0   Glucose, UA NEGATIVE NEGATIVE mg/dL   Hgb urine dipstick NEGATIVE NEGATIVE   Bilirubin Urine NEGATIVE NEGATIVE   Ketones, ur NEGATIVE NEGATIVE mg/dL   Protein, ur NEGATIVE NEGATIVE mg/dL   Nitrite NEGATIVE NEGATIVE   Leukocytes,Ua NEGATIVE NEGATIVE  D-dimer,  quantitative (not at [facility])  Result Value Ref Range   D-Dimer, Quant 0.87 (H) 0.00 - 0.50 ug/mL-FEU  Troponin I (High Sensitivity)  Result Value Ref Range   Troponin I (High Sensitivity) 16 <18 ng/L  Troponin I (High Sensitivity)  Result Value Ref Range   Troponin I (High Sensitivity) 17 <18 ng/L   Dg Chest 2 View  Result Date: 09/05/2019 CLINICAL DATA:  Shortness of breath. EXAM: CHEST - 2 VIEW COMPARISON:  Chest x-ray dated October 27, 2018. FINDINGS: The heart size and mediastinal contours are within normal limits. Atherosclerotic calcification of the aortic arch. Normal pulmonary vascularity. Low lung volumes. No focal consolidation, pleural effusion, or pneumothorax. No acute osseous abnormality. IMPRESSION: No active cardiopulmonary disease. Electronically Signed   By: Titus Dubin M.D.   On: 09/05/2019 22:00   Ct Angio Chest Pe W And/or Wo Contrast  Result Date: 09/06/2019 CLINICAL DATA:  Shortness of breath with lower extremity edema and elevated  D-dimer. EXAM: CT ANGIOGRAPHY CHEST WITH CONTRAST TECHNIQUE: Multidetector CT imaging of the chest was performed using the standard protocol during bolus administration of intravenous contrast. Multiplanar CT image reconstructions and MIPs were obtained to evaluate the vascular anatomy. CONTRAST:  19mL OMNIPAQUE IOHEXOL 350 MG/ML SOLN COMPARISON:  None. FINDINGS: Cardiovascular: --Pulmonary arteries: Contrast injection is sufficient to demonstrate satisfactory opacification of the pulmonary arteries to the segmental level, with attenuation of at least 200 HU at the main pulmonary artery. There is no pulmonary embolus. The main pulmonary artery is within normal limits for size. --Aorta: Limited opacification of the aorta due to bolus timing optimization for the pulmonary arteries. The course and caliber of the aorta are normal. Conventional 3 vessel aortic branching pattern. There is mild aortic atherosclerosis. --Heart: Normal size. No  pericardial effusion. There are coronary artery calcifications. Mediastinum/Nodes: No mediastinal, hilar or axillary lymphadenopathy. The visualized thyroid and thoracic esophageal course are unremarkable. Lungs/Pleura: No pulmonary nodules or masses. No pleural effusion or pneumothorax. No focal airspace consolidation. No focal pleural abnormality. Mild pulmonary edema. Upper Abdomen: Contrast bolus timing is not optimized for evaluation of the abdominal organs. There is nodular enlargement both adrenal glands, unchanged. Musculoskeletal: No chest wall abnormality. No bony spinal canal stenosis. Review of the MIP images confirms the above findings. IMPRESSION: 1. No pulmonary embolus or acute aortic syndrome. 2. Mild pulmonary edema. 3. Coronary artery and aortic Atherosclerosis (ICD10-I70.0). Electronically Signed   By: Ulyses Jarred M.D.   On: 09/06/2019 01:49    12:54 AM Delta trop WNL.  UA without signs of infection.  D-dimer elevated at 0.87.  Will get CTA.  CTA without findings of PE. Patient ambulated, maintained saturations of 92-97%.  She has diuresed some and is feeling better from a breathing standpoint.  On reassessment, her biggest complaint is the pain in her legs.  This is chronic pain due to her neuropathy/fibromyalgia, she is in pain management for this.  I have ordered her home lyrica.  She does not seem to think that she has neuropathy because she is "not diabetic".  She has a long documented history of this and is on several medications for diabetes, however when I try and talk to her about this she gets very agitated.  She states "I just need my legs to stop hurting".  Given this is her chronic pain, this will not be resolved tonight which I have discussed with her multiple times.   She appears stable for discharge.  Can follow-up with PCP.  She will need to address her ongoing pain with her pain management doctor.  She may return here for any new/acute changes.   Larene Pickett,  PA-C [id number]    Delora Fuel, MD [id number] [phone]

## 2019-09-05 NOTE — ED Triage Notes (Signed)
Pt c/o sob with activity, current kidney infection and fluid collecting in legs.

## 2019-09-05 NOTE — ED Provider Notes (Signed)
[facility] EMERGENCY DEPARTMENT Provider Note   CSN: [id number] Arrival date & time: 09/05/19  2022     History   Chief Complaint Chief Complaint  Patient presents with  . Shortness of Breath    HPI Stephanie Mckenzie is a 78 y.o. female presenting for evaluation of leg swelling.  Patient states that the possible days, she has had worsening leg swelling.  Worsened significantly today.  She was seen at urgent care, recommend she come to the ER for further evaluation.  Patient states she has a history of CKD stage III.  She is on 2 diuretics, chlorthalidone and torsemide which she has been taking as prescribed.  She has been urinating frequently.  Patient states she was recently treated for UTI, but feels like there is been no improvement since her treatment.  She reports occasionally she feels short of breath/short winded, but none at rest.  She denies worsening orthopnea.  She denies fevers, chills, sore throat, chest pain, cough, nausea, vomiting, abdominal pain, dysuria, or abnormal bowel movements.  Patient states her urine is dark and has a weird smell, but otherwise is normal.  Additional history obtained from chart review.  Patient with a history of CHF, edema, GERD, hypertension, CKD III (basline SCr 1.6-1.8).      HPI  Past Medical History:  Diagnosis Date  . CHF (congestive heart failure) ([place])   . Depression   . Edema 10/09/2015  . GERD (gastroesophageal reflux disease)   . High cholesterol   . Hypertension   . Migraine     Patient Active Problem List   Diagnosis Date Noted  . Diabetic polyneuropathy associated with diabetes mellitus due to underlying condition ([place]) 08/20/2017  . Right hip pain 05/07/2017  . Chronic pain syndrome 04/09/2017  . Neuropathy 04/09/2017  . 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 Type 2 diabetes mellitus with neurological complications ([place]) 88/28/0034  . Morbid obesity ([place]) 03/17/2017  . Left elbow pain  03/11/2017  . Right shoulder pain 03/11/2017  . OSA (obstructive sleep apnea) 08/30/2016  . Diastolic dysfunction 91/79/1505  . Synovitis of knee 06/05/2016  . Hyperglycemia 11/17/2015  . Disorder of uterus 2020-08-1315  . Degenerative joint disease involving multiple joints 2020-08-1315  . Muscle ache 2020-08-1315  . Bulge of lumbar disc without myelopathy 2020-08-1315  . Jaw pain 2020-08-1315  . IBS (irritable bowel syndrome) 2020-08-1315  . Abnormal cardiovascular function study 2020-08-1315  . Adjustment disorder with depressed mood 2020-08-1315  . Degenerative arthritis of spine 2020-08-1315  . Excessive falling 2020-08-1315  . Edema 10/09/2015  . Effusion, pericardium 09/07/2015  . Acute post-traumatic headache, not intractable 09/06/2015  . Memory loss 07/25/2015  . Mental confusion 06/07/2015  . Avitaminosis D 05/16/2015  . B12 deficiency 05/16/2015  . Arteriosclerosis of coronary artery 05/16/2015  . Fibrositis 05/16/2015  . Fibromyalgia 05/16/2015  . MDD (major depressive disorder) 05/13/2015  . Affective disorder, major 05/13/2015  . Depression   . Suicidal ideation   . Chronic kidney disease 01/19/2015  . Bursitis, trochanteric 01/18/2015  . H/O gastrointestinal disease 09/30/2014  . Mass of pelvis 09/24/2014  . CAD in native artery 09/24/2014  . Cyst of ovary 08/26/2014  . Colitis 08/11/2014  . Colitis gravis ([place]) 08/05/2014  . Ulcerative colitis ([place]) 08/05/2014  . 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 Anxiety 06/09/2013  . Pain in joint, ankle and foot 06/09/2013  . GERD (gastroesophageal reflux disease) 06/09/2013  . Insomnia 06/09/2013  . Constipation 06/09/2013  . Depression with  anxiety 05/22/2013  . Bimalleolar fracture 05/07/2013  . Essential hypertension, benign 05/07/2013  . Insomnia, unspecified 05/07/2013  . Pain in limb 05/07/2013  . OP (osteoporosis) 05/02/2013  . Difficulty hearing 05/02/2013  . HLD (hyperlipidemia) 05/02/2013  . HPTH (hyperparathyroidism)  ([place]) 05/02/2013  . Malaise and fatigue 05/02/2013  . Clinical depression 05/02/2013  . Moderate episode of recurrent major depressive disorder ([place]) 05/02/2013  . Hyperparathyroidism ([place]) 05/02/2013    Past Surgical History:  Procedure Laterality Date  . ABDOMINAL HYSTERECTOMY    . APPENDECTOMY    . CESAREAN SECTION     x 2  . CORONARY ANGIOPLASTY    . LAMINECTOMY    . PARATHYROIDECTOMY       OB History   No obstetric history on file.      Home Medications    Prior to Admission medications   Medication Sig Start Date End Date Taking? Authorizing Provider  alendronate (FOSAMAX) 70 MG tablet Take 1 tablet (70 mg total) by mouth every 7 (seven) days. Take with a full glass of water on an empty stomach. 02/04/18   Ann Held, DO  aspirin 81 MG tablet Take 81 mg by mouth daily.    [provider]  atorvastatin (LIPITOR) 40 MG tablet TAKE ONE TABLET BY MOUTH ONCE DAILY 01/22/17   Carollee Herter, Alferd Apa, DO  Blood Glucose Monitoring Suppl (ONE TOUCH ULTRA 2) w/Device KIT Use as directed to check blood sugar.   DXE11.9 10/14/17   Roma Schanz R, DO  cetirizine (ZYRTEC) 10 MG tablet TAKE 1 TABLET BY MOUTH ONCE DAILY 02/25/18   Carollee Herter, Alferd Apa, DO  diltiazem (CARTIA XT) 180 MG 24 hr capsule Take 1 capsule (180 mg total) by mouth daily. 01/24/18   Ann Held, DO  diphenoxylate-atropine (LOMOTIL) 2.5-0.025 MG tablet Take 1 tablet by mouth 4 (four) times daily as needed for diarrhea or loose stools. Take 1 tablet by mouth 4 times a day as needed for Diarrhea. 02/04/18   Ann Held, DO  FLUoxetine (PROZAC) 40 MG capsule Take 1 capsule by mouth daily. 11/03/15   [provider]  fluticasone (FLONASE) 50 MCG/ACT nasal spray Place 2 sprays into both nostrils daily. 02/28/16   Roma Schanz R, DO  folic acid (FOLVITE) 1 MG tablet TAKE ONE TABLET BY MOUTH ONCE DAILY 08/02/16   Carollee Herter, Alferd Apa, DO  furosemide (LASIX) 40 MG tablet Take  1 tablet (40 mg total) by mouth daily. 10/28/17   Wendling, Crosby Oyster, DO  glipiZIDE (GLUCOTROL) 5 MG tablet TAKE 1 TABLET BY MOUTH ONCE DAILY WITH BREAKFAST **PATIENT NEEDS OFFICE VISIT FOR FURTHER REFILLS** 07/14/18   Roma Schanz R, DO  glucose blood test strip Check blood sugar no more than three times daily. 10/14/17   Ann Held, DO  HYDROcodone-acetaminophen (NORCO/VICODIN) 5-325 MG tablet TK 1 T PO EVERY 4 HOURS PRN FOR PAIN 06/17/17   Ann Held, DO  hydrOXYzine (ATARAX/VISTARIL) 10 MG tablet Take 1 tab twice a day as needed for anxiety 06/06/17   [provider]  ibuprofen (ADVIL,MOTRIN) 800 MG tablet  05/03/17   [provider]  ipratropium (ATROVENT HFA) 17 MCG/ACT inhaler Inhale 2 puffs into the lungs every 6 (six) hours as needed for wheezing. 10/30/16   Saguier, Percell Miller, PA-C  metFORMIN (GLUCOPHAGE-XR) 500 MG 24 hr tablet TAKE 1 TABLET BY MOUTH WITH BREAKFAST 05/12/18   Roma Schanz R, DO  metoprolol  succinate (TOPROL-XL) 50 MG 24 hr tablet TAKE ONE TABLET BY MOUTH TWICE DAILY 09/02/16   Carollee Herter, Alferd Apa, DO  mirtazapine (REMERON) 30 MG tablet Take 30 mg by mouth at bedtime.    [provider]  NONFORMULARY OR COMPOUNDED ITEM Compression stockings 20-30 mg hg #1 pair 07/12/16   Carollee Herter, Yvonne R, DO  NYSTATIN powder APPLY  POWDER TOPICALLY UP TO 4 TIMES DAILY 04/05/17   Carollee Herter, Alferd Apa, DO  omeprazole (PRILOSEC) 40 MG capsule TAKE 1 CAPSULE BY MOUTH TWICE DAILY 09/27/17   Carollee Herter, Alferd Apa, DO  ONE TOUCH LANCETS MISC Check blood sugar no more than three times daily 10/14/17   Roma Schanz R, DO  oxybutynin (DITROPAN XL) 15 MG 24 hr tablet Take 1 tablet (15 mg total) daily by mouth. 10/03/17   Roma Schanz R, DO  potassium chloride (K-DUR) 10 MEQ tablet TAKE ONE TABLET BY MOUTH THREE TIMES DAILY 06/27/17   Carollee Herter, Alferd Apa, DO  pregabalin (LYRICA) 50 MG capsule Take 1 capsule (50 mg total) by mouth  3 (three) times daily. 08/19/17   Cameron Sprang, MD  QUEtiapine (SEROQUEL) 25 MG tablet  04/14/17   [provider]  tiZANidine (ZANAFLEX) 4 MG tablet Take 1 tablet (4 mg total) by mouth every 6 (six) hours as needed for muscle spasms. 06/17/17   Ann Held, DO  torsemide (DEMADEX) 20 MG tablet  02/07/17   [provider]  traMADol (ULTRAM) 50 MG tablet Take 1 tablet (50 mg total) by mouth every 6 (six) hours as needed (for pain). 11/29/16   Ann Held, DO  Vitamin D, Ergocalciferol, (DRISDOL) 50000 units CAPS capsule Take 1 capsule (50,000 Units total) by mouth every 7 (seven) days. 09/14/16   Saguier, Percell Miller, PA-C  zolpidem (AMBIEN) 5 MG tablet Take 5 mg by mouth once.    [provider]    Family History Family History  Problem Relation Age of Onset  . Diabetes Mother   . Hypertension Mother   . Thyroid disease Mother   . Pancreatitis Father   . COPD Sister   . Rheumatologic disease Neg Hx     Social History Social History   Tobacco Use  . Smoking status: Former Smoker    Packs/day: 1.00    Years: 30.00    Pack years: 30.00    Types: Cigarettes    Quit date: 11/26/2012    Years since quitting: 6.7  . Smokeless tobacco: Never Used  Substance Use Topics  . Alcohol use: Yes    Alcohol/week: 0.0 standard drinks    Comment: occasional  . Drug use: No     Allergies   Dilaudid [hydromorphone], Lorazepam, Morphine and related, Acyclovir and related, Amlodipine besylate, and Buprenorphine hcl   Review of Systems Review of Systems  Respiratory: Positive for shortness of breath.   Cardiovascular: Positive for leg swelling. Negative for chest pain.  Gastrointestinal: Negative for abdominal pain, nausea and vomiting.  Genitourinary: Negative for dysuria.       Urinary frequency, dark urine, and abnl smell  All other systems reviewed and are negative.    Physical Exam Updated Vital Signs BP (!) 169/69 (BP Location: Right Arm)    Pulse 96   Temp 98 F (36.7 C) (Oral)   Resp 20   Ht 5' 4"  (1.626 m)   Wt 118.8 kg   SpO2 99%   BMI 44.97 kg/m   Physical Exam Vitals  signs and nursing note reviewed.  Constitutional:      General: She is not in acute distress.    Appearance: She is well-developed.     Comments: Appears nontoxic  HENT:     Head: Normocephalic and atraumatic.  Eyes:     Extraocular Movements: Extraocular movements intact.     Conjunctiva/sclera: Conjunctivae normal.     Pupils: Pupils are equal, round, and reactive to light.  Neck:     Musculoskeletal: Normal range of motion and neck supple.  Cardiovascular:     Rate and Rhythm: Normal rate and regular rhythm.     Pulses: Normal pulses.  Pulmonary:     Effort: Pulmonary effort is normal. No respiratory distress.     Breath sounds: Normal breath sounds. No wheezing.     Comments: Speaking in full sentences. Clear lung sounds.  Abdominal:     General: There is no distension.     Palpations: Abdomen is soft. There is no mass.     Tenderness: There is no abdominal tenderness. There is no right CVA tenderness, left CVA tenderness, guarding or rebound.     Comments: No ttp of the abd. Soft without rigidity, guarding, or distension. Negative rebound. No cva tenderness  Musculoskeletal: Normal range of motion.     Right lower leg: Edema present.     Left lower leg: Edema present.     Comments: Bilateral 3+ pitting edema. Tenderness with slight erythema.   Skin:    General: Skin is warm and dry.     Capillary Refill: Capillary refill takes less than 2 seconds.  Neurological:     Mental Status: She is alert and oriented to person, place, and time.      ED Treatments / Results  Labs (all labs ordered are listed, but only abnormal results are displayed) Labs Reviewed  COMPREHENSIVE METABOLIC PANEL - Abnormal; Notable for the following components:      Result Value   Potassium 3.0 (*)    Glucose, Bld 168 (*)    BUN 26 (*)    Creatinine,  Ser 1.46 (*)    Albumin 3.4 (*)    GFR calc non Af Amer 35 (*)    GFR calc Af Amer 40 (*)    All other components within normal limits  CBC - Abnormal; Notable for the following components:   RDW 17.0 (*)    All other components within normal limits  BRAIN NATRIURETIC PEPTIDE  URINALYSIS, ROUTINE W REFLEX MICROSCOPIC  TROPONIN I (HIGH SENSITIVITY)  TROPONIN I (HIGH SENSITIVITY)    EKG EKG Interpretation  Date/Time:  Saturday September 05 2019 21:24:09 EDT Ventricular Rate:  97 PR Interval:  144 QRS Duration: 93 QT Interval:  324 QTC Calculation: 412 R Axis:   34 Text Interpretation:  Sinus rhythm Abnormal R-wave progression, early transition LVH with secondary repolarization abnormality no significant change since earlier in the day Confirmed by Sherwood Gambler [phone]) on 09/05/2019 10:57:09 PM   Radiology Dg Chest 2 View  Result Date: 09/05/2019 CLINICAL DATA:  Shortness of breath. EXAM: CHEST - 2 VIEW COMPARISON:  Chest x-ray dated October 27, 2018. FINDINGS: The heart size and mediastinal contours are within normal limits. Atherosclerotic calcification of the aortic arch. Normal pulmonary vascularity. Low lung volumes. No focal consolidation, pleural effusion, or pneumothorax. No acute osseous abnormality. IMPRESSION: No active cardiopulmonary disease. Electronically Signed   By: Titus Dubin M.D.   On: 09/05/2019 22:00    Procedures Procedures (including critical care time)  Medications Ordered in ED Medications  furosemide (LASIX) injection 40 mg (40 mg Intravenous Given 09/05/19 2247)     Initial Impression / Assessment and Plan / ED Course  I have reviewed the triage vital signs and the nursing notes.  Pertinent labs & imaging results that were available during my care of the patient were reviewed by me and considered in my medical decision making (see chart for details).        Pt presenting for evaluation of leg swelling and slightly worsening SOB.  physical exam shows bilateral lower leg swelling. Otherwise pt nontoxic. Will order labs, cxr, ekg, and ua for further evaluation. Lasix given while waiting for results.   Labs at baseline. SCr stable. K slightly low. BNP normal. cxr viewed and interpreted by me, no pna, pnx, effusion. EKG without stemi. Trop 16. Will order delta. Discussed with attending, Dr. Regenia Skeeter evaluated the pt.   Will plan on repeat trop. Ddimer, as pt has unexplained sxs. UA pending. Will ambulate ot ensure pt is safe for d/c.   Pt signed out to L Sanders, PA-C. If remaining labs and ambulation ok, pt can likely be d/c.  Final Clinical Impressions(s) / ED Diagnoses   Final diagnoses:  None    ED Discharge Orders    None       Franchot Heidelberg, PA-C 09/05/19 2348    Sherwood Gambler, MD 09/06/19 1705

## 2019-09-06 ENCOUNTER — Emergency Department (HOSPITAL_COMMUNITY): Payer: Medicare Other

## 2019-09-06 DIAGNOSIS — R600 Localized edema: Secondary | ICD-10-CM | POA: Diagnosis not present

## 2019-09-06 LAB — TROPONIN I (HIGH SENSITIVITY): Troponin I (High Sensitivity): 17 ng/L (ref <18)

## 2019-09-06 LAB — URINALYSIS, ROUTINE W REFLEX MICROSCOPIC
Bilirubin Urine: NEGATIVE
Glucose, UA: NEGATIVE mg/dL
Hgb urine dipstick: NEGATIVE
Ketones, ur: NEGATIVE mg/dL
Leukocytes,Ua: NEGATIVE
Nitrite: NEGATIVE
Protein, ur: NEGATIVE mg/dL
Specific Gravity, Urine: 1.01 (ref 1.005–1.030)
pH: 7 (ref 5.0–8.0)

## 2019-09-06 LAB — D-DIMER, QUANTITATIVE: D-Dimer, Quant: 0.87 ug/mL-FEU — ABNORMAL HIGH (ref 0.00–0.50)

## 2019-09-06 MED ORDER — PREGABALIN 100 MG PO CAPS
100.0000 mg | ORAL_CAPSULE | Freq: Once | ORAL | Status: AC
  Administered 2019-09-06: 100 mg via ORAL
  Filled 2019-09-06: qty 1

## 2019-09-06 MED ORDER — IOHEXOL 350 MG/ML SOLN
100.0000 mL | Freq: Once | INTRAVENOUS | Status: AC | PRN
  Administered 2019-09-06: 100 mL via INTRAVENOUS

## 2019-09-06 NOTE — ED Notes (Signed)
Pt ambulated in hall with one person assistance, O2 sat was at 97% in bed and dropped to 92% while ambulating. O2 then returned to 97% once returned to bed and instructed to take deep breaths.

## 2019-09-06 NOTE — ED Notes (Signed)
Pt verbalized understanding of d/c instructions and need for follow up

## 2019-09-06 NOTE — Discharge Instructions (Signed)
Continue your home pain medications.  You will need to talk with your pain management doctor if you feel you need further medications. Follow-up with your primary care doctor. Return here for any new/acute changes.

## 2019-11-16 ENCOUNTER — Telehealth: Payer: Self-pay | Admitting: Family Medicine

## 2019-11-18 NOTE — Telephone Encounter (Signed)
Associate from [facility] came in to the office to have Dr. Etter Sjogren sign death certificate. Advised him that Dr. Etter Sjogren hasn't seen patient in 2 years and wouldn't be able to sign death certificate.

## 2019-11-27 NOTE — Telephone Encounter (Signed)
We may need to call nursing facility--- I have not seen her in over 2 years -- it looks like any way Looks like she was see [facility]?

## 2019-11-27 NOTE — Telephone Encounter (Signed)
Received call from EMS early this morning that patient was found deceased at nursing facility ([facility]).  Death certificate will be sent to PCP's office.

## 2019-11-27 DEATH — deceased
